# Patient Record
Sex: Male | Born: 1950
Health system: Southern US, Community
[De-identification: ages and names within clinical notes are randomized; demographics above are authoritative.]

## PROBLEM LIST (undated history)

## (undated) DIAGNOSIS — M1990 Unspecified osteoarthritis, unspecified site: Secondary | ICD-10-CM

## (undated) DIAGNOSIS — K219 Gastro-esophageal reflux disease without esophagitis: Secondary | ICD-10-CM

## (undated) DIAGNOSIS — I4891 Unspecified atrial fibrillation: Secondary | ICD-10-CM

## (undated) DIAGNOSIS — R7303 Prediabetes: Secondary | ICD-10-CM

## (undated) DIAGNOSIS — E785 Hyperlipidemia, unspecified: Secondary | ICD-10-CM

## (undated) DIAGNOSIS — Z8619 Personal history of other infectious and parasitic diseases: Secondary | ICD-10-CM

## (undated) DIAGNOSIS — F32A Depression, unspecified: Secondary | ICD-10-CM

## (undated) DIAGNOSIS — S42309A Unspecified fracture of shaft of humerus, unspecified arm, initial encounter for closed fracture: Secondary | ICD-10-CM

## (undated) DIAGNOSIS — I10 Essential (primary) hypertension: Secondary | ICD-10-CM

## (undated) DIAGNOSIS — E119 Type 2 diabetes mellitus without complications: Secondary | ICD-10-CM

## (undated) DIAGNOSIS — F418 Other specified anxiety disorders: Secondary | ICD-10-CM

## (undated) DIAGNOSIS — K760 Fatty (change of) liver, not elsewhere classified: Secondary | ICD-10-CM

## (undated) DIAGNOSIS — F329 Major depressive disorder, single episode, unspecified: Secondary | ICD-10-CM

## (undated) HISTORY — DX: Other specified anxiety disorders: F41.8

## (undated) HISTORY — DX: Hyperlipidemia, unspecified: E78.5

## (undated) HISTORY — DX: Personal history of other infectious and parasitic diseases: Z86.19

## (undated) HISTORY — PX: COLONOSCOPY: SHX174

## (undated) HISTORY — DX: Fatty (change of) liver, not elsewhere classified: K76.0

## (undated) HISTORY — DX: Gastro-esophageal reflux disease without esophagitis: K21.9

## (undated) HISTORY — DX: Essential (primary) hypertension: I10

## (undated) HISTORY — DX: Unspecified atrial fibrillation: I48.91

## (undated) HISTORY — DX: Prediabetes: R73.03

## (undated) HISTORY — DX: Type 2 diabetes mellitus without complications: E11.9

---

## 2007-12-06 HISTORY — PX: KNEE SURGERY: SHX244

## 2008-05-14 ENCOUNTER — Ambulatory Visit: Payer: Self-pay

## 2008-06-03 ENCOUNTER — Ambulatory Visit: Payer: Self-pay | Admitting: Orthopaedic Surgery

## 2008-06-13 ENCOUNTER — Encounter: Payer: Self-pay | Admitting: Orthopaedic Surgery

## 2008-07-05 ENCOUNTER — Encounter: Payer: Self-pay | Admitting: Orthopaedic Surgery

## 2009-12-28 ENCOUNTER — Ambulatory Visit: Payer: Self-pay | Admitting: Gastroenterology

## 2010-11-01 ENCOUNTER — Ambulatory Visit: Payer: Self-pay | Admitting: Endocrinology

## 2011-02-23 ENCOUNTER — Ambulatory Visit: Payer: Self-pay | Admitting: Internal Medicine

## 2011-04-15 ENCOUNTER — Ambulatory Visit: Payer: Self-pay | Admitting: Internal Medicine

## 2013-01-22 ENCOUNTER — Ambulatory Visit: Payer: Self-pay | Admitting: Nurse Practitioner

## 2013-05-03 LAB — LIPID PANEL: LDL (calc): 82

## 2013-05-03 LAB — HEMOGLOBIN A1C: A1c: 5.8

## 2013-05-14 LAB — HM DIABETES FOOT EXAM

## 2013-09-16 ENCOUNTER — Encounter (INDEPENDENT_AMBULATORY_CARE_PROVIDER_SITE_OTHER): Payer: Self-pay

## 2013-09-16 ENCOUNTER — Ambulatory Visit (INDEPENDENT_AMBULATORY_CARE_PROVIDER_SITE_OTHER): Payer: BC Managed Care – PPO | Admitting: Cardiovascular Disease

## 2013-09-16 ENCOUNTER — Encounter: Payer: Self-pay | Admitting: Cardiovascular Disease

## 2013-09-16 VITALS — BP 130/86 | HR 80 | Ht 71.0 in | Wt 204.2 lb

## 2013-09-16 DIAGNOSIS — I480 Paroxysmal atrial fibrillation: Secondary | ICD-10-CM | POA: Insufficient documentation

## 2013-09-16 DIAGNOSIS — F329 Major depressive disorder, single episode, unspecified: Secondary | ICD-10-CM

## 2013-09-16 DIAGNOSIS — F339 Major depressive disorder, recurrent, unspecified: Secondary | ICD-10-CM | POA: Insufficient documentation

## 2013-09-16 DIAGNOSIS — E119 Type 2 diabetes mellitus without complications: Secondary | ICD-10-CM

## 2013-09-16 DIAGNOSIS — F32A Depression, unspecified: Secondary | ICD-10-CM

## 2013-09-16 DIAGNOSIS — E118 Type 2 diabetes mellitus with unspecified complications: Secondary | ICD-10-CM | POA: Insufficient documentation

## 2013-09-16 DIAGNOSIS — Z87891 Personal history of nicotine dependence: Secondary | ICD-10-CM

## 2013-09-16 DIAGNOSIS — E1169 Type 2 diabetes mellitus with other specified complication: Secondary | ICD-10-CM | POA: Insufficient documentation

## 2013-09-16 DIAGNOSIS — F3289 Other specified depressive episodes: Secondary | ICD-10-CM

## 2013-09-16 DIAGNOSIS — I4891 Unspecified atrial fibrillation: Secondary | ICD-10-CM

## 2013-09-16 DIAGNOSIS — E785 Hyperlipidemia, unspecified: Secondary | ICD-10-CM

## 2013-09-16 DIAGNOSIS — I10 Essential (primary) hypertension: Secondary | ICD-10-CM

## 2013-09-16 MED ORDER — ATORVASTATIN CALCIUM 40 MG PO TABS: 40.0000 mg | ORAL_TABLET | Freq: Every day | ORAL | Status: DC

## 2013-09-16 MED ORDER — PAROXETINE HCL 20 MG PO TABS: 20.0000 mg | ORAL_TABLET | ORAL | Status: DC

## 2013-09-16 NOTE — Assessment & Plan Note (Signed)
He closely monitor his his heart rate with a blood pressure cuff, also closely tracks his rhythm. He denies having any further arrhythmia concerning for atrial fibrillation. We have suggested he stay on his metoprolol.

## 2013-09-16 NOTE — Assessment & Plan Note (Signed)
At his request, we have renewed his Celexa. When he establishes with primary care, he would prefer them to take over the prescription writing.

## 2013-09-16 NOTE — Assessment & Plan Note (Signed)
Blood pressure is well controlled on today's visit. No changes made to the medications. 

## 2013-09-16 NOTE — Assessment & Plan Note (Signed)
We have encouraged him to watch his diet, try to keep his weight low. Most recent hemoglobin A1c 5.8 in May 2014.

## 2013-09-16 NOTE — Patient Instructions (Signed)
You are doing well. No medication changes were made.  Consider downloading the apps for heart rate monitoring: Cardiograph, instant heart rate  Please call us if you have new issues that need to be addressed before your next appt.  Your physician wants you to follow-up in: 12 months.  You will receive a reminder letter in the mail two months in advance. If you don't receive a letter, please call our office to schedule the follow-up appointment.

## 2013-09-16 NOTE — Assessment & Plan Note (Signed)
He reports that he only smoked for 5 years, stopped in the 1980s.

## 2013-09-16 NOTE — Assessment & Plan Note (Signed)
Cholesterol is at goal on the current lipid regimen. No changes to the medications were made.  

## 2013-09-16 NOTE — Progress Notes (Signed)
Patient ID: Jonathon Middleton, male    DOB: Aug 27, 1951, 62 y.o.   MRN: [id number]  HPI Comments: Jonathon Middleton is a very pleasant 62 year old gentleman with prior smoking history for 5 years who stopped in 1984, prior history of atrial fibrillation in January 2014 converted to normal sinus rhythm with medical management who presents to establish care in our office.  He currently does not have a primary care physician and is moving cardiologist to our group.  He reports that he had asymptomatic atrial fibrillation in January 2014. He did have some mild malaise, bloating in his chest. He was found on routine physical exam by Dr. Alison Murray to have a heart rate of 140 beats per minute. He was referred to [facility] cardiology, started on metoprolol. He had echocardiogram at that time which was essentially normal. Also had stress test with normal treadmill  He reports having significant work stress. Tries to work on his treadmill when he can. Weight has been a challenge to maintain secondary to long work hours. Labs from may 2014 :  Hemoglobin A1c 5.8, total cholesterol 151, LDL 82, HDL 39  He has been taking a cholesterol medication for at least 14 years in terms of his family history, father had heart attack at age 33 and died. He was a long-time smoker. Mother lived to 12  EKG today shows normal sinus rhythm with rate 80 beats per minute, no significant ST or T wave changes      Outpatient Encounter Prescriptions as of 09/16/2013  Medication Sig Dispense Refill  . aspirin 325 MG tablet Take 325 mg by mouth daily.      Marland Kitchen atorvastatin (LIPITOR) 40 MG tablet Take 1 tablet (40 mg total) by mouth daily.  90 tablet  3  . Cholecalciferol (VITAMIN D-3) 1000 UNITS CAPS Take 2,000 capsules by mouth 2 (two) times daily.      . Coenzyme Q10 (CO Q-10) 100 MG CAPS Take 100 mg by mouth daily.      . lansoprazole (PREVACID) 15 MG capsule Take 15 mg by mouth 2 (two) times daily.      Marland Kitchen lisinopril  (PRINIVIL,ZESTRIL) 20 MG tablet Take 20 mg by mouth daily.      . Melatonin 5 MG TABS Take 5 mg by mouth at bedtime.      . metoprolol succinate (TOPROL-XL) 25 MG 24 hr tablet Take 25 mg by mouth daily.      Marland Kitchen omega-3 fish oil (MAXEPA) 1000 MG CAPS capsule Take 2 capsules by mouth 2 (two) times daily.      . traZODone (DESYREL) 50 MG tablet Take 50 mg by mouth at bedtime.      Marland Kitchen PARoxetine (PAXIL) 20 MG tablet Take 1 tablet (20 mg total) by mouth every morning.  30 tablet  2   No facility-administered encounter medications on file as of 09/16/2013.     Review of Systems  Constitutional: Negative.   HENT: Negative.   Eyes: Negative.   Respiratory: Negative.   Cardiovascular: Negative.   Gastrointestinal: Negative.   Endocrine: Negative.   Musculoskeletal: Negative.   Skin: Negative.   Allergic/Immunologic: Negative.   Neurological: Negative.   Hematological: Negative.   Psychiatric/Behavioral: Negative.   All other systems reviewed and are negative.    BP 130/86  Pulse 80  Ht 5\' 11"  (1.803 m)  Wt 204 lb 4 oz (92.647 kg)  BMI 28.5 kg/m2  Physical Exam  Nursing note and vitals reviewed. Constitutional: He is oriented to person, place,  and time. He appears well-developed and well-nourished.  HENT:  Head: Normocephalic.  Nose: Nose normal.  Mouth/Throat: Oropharynx is clear and moist.  Eyes: Conjunctivae are normal. Pupils are equal, round, and reactive to light.  Neck: Normal range of motion. Neck supple. No JVD present.  Cardiovascular: Normal rate, regular rhythm, S1 normal, S2 normal, normal heart sounds and intact distal pulses.  Exam reveals no gallop and no friction rub.   No murmur heard. Pulmonary/Chest: Effort normal and breath sounds normal. No respiratory distress. He has no wheezes. He has no rales. He exhibits no tenderness.  Abdominal: Soft. Bowel sounds are normal. He exhibits no distension. There is no tenderness.  Musculoskeletal: Normal range of motion.  He exhibits no edema and no tenderness.  Lymphadenopathy:    He has no cervical adenopathy.  Neurological: He is alert and oriented to person, place, and time. Coordination normal.  Skin: Skin is warm and dry. No rash noted. No erythema.  Psychiatric: He has a normal mood and affect. His behavior is normal. Judgment and thought content normal.      Assessment and Plan

## 2013-09-19 ENCOUNTER — Encounter: Payer: Self-pay | Admitting: Family Medicine

## 2013-09-19 ENCOUNTER — Ambulatory Visit (INDEPENDENT_AMBULATORY_CARE_PROVIDER_SITE_OTHER): Payer: BC Managed Care – PPO | Admitting: Family Medicine

## 2013-09-19 ENCOUNTER — Other Ambulatory Visit: Payer: Self-pay | Admitting: *Deleted

## 2013-09-19 VITALS — BP 124/82 | HR 130 | Temp 97.9°F | Ht 71.0 in | Wt 204.0 lb

## 2013-09-19 DIAGNOSIS — K219 Gastro-esophageal reflux disease without esophagitis: Secondary | ICD-10-CM

## 2013-09-19 DIAGNOSIS — E119 Type 2 diabetes mellitus without complications: Secondary | ICD-10-CM

## 2013-09-19 DIAGNOSIS — F32A Depression, unspecified: Secondary | ICD-10-CM

## 2013-09-19 DIAGNOSIS — K760 Fatty (change of) liver, not elsewhere classified: Secondary | ICD-10-CM

## 2013-09-19 DIAGNOSIS — I10 Essential (primary) hypertension: Secondary | ICD-10-CM

## 2013-09-19 DIAGNOSIS — K7689 Other specified diseases of liver: Secondary | ICD-10-CM

## 2013-09-19 DIAGNOSIS — F329 Major depressive disorder, single episode, unspecified: Secondary | ICD-10-CM

## 2013-09-19 DIAGNOSIS — I4891 Unspecified atrial fibrillation: Secondary | ICD-10-CM

## 2013-09-19 DIAGNOSIS — E785 Hyperlipidemia, unspecified: Secondary | ICD-10-CM

## 2013-09-19 DIAGNOSIS — F3289 Other specified depressive episodes: Secondary | ICD-10-CM

## 2013-09-19 MED ORDER — METOPROLOL SUCCINATE ER 50 MG PO TB24: 50.0000 mg | ORAL_TABLET | Freq: Every day | ORAL | Status: DC

## 2013-09-19 MED ORDER — APIXABAN 5 MG PO TABS: 5.0000 mg | ORAL_TABLET | Freq: Two times a day (BID) | ORAL | Status: DC

## 2013-09-19 MED ORDER — PAROXETINE HCL 40 MG PO TABS: 40.0000 mg | ORAL_TABLET | ORAL | Status: DC

## 2013-09-19 NOTE — Assessment & Plan Note (Signed)
Chronic, stable. Continue meds. 

## 2013-09-19 NOTE — Assessment & Plan Note (Signed)
fmhx premature CAD.  Goal LDL <100. Brings records showing last LDL 86. Continue lipitor. ?h/o transaminitis to statin - check LFT next blood draw.

## 2013-09-19 NOTE — Assessment & Plan Note (Signed)
Consider abd [place] if transaminitis returns. H/o transaminitis to statin in the past.  Tolerating lipitor currently.

## 2013-09-19 NOTE — Patient Instructions (Addendum)
Call your insurance about the shingles shot to see if it is covered or how much it would cost and where is cheaper (here or pharmacy).  If you want to receive here, call for nurse visit. Let's try just melatonin for sleep at night. Take paxil 20mg  for 1 week then increase to 40mg  daily (new prescription at pharmacy) Try prevacid once daily. Stop Aspirin.  Start blood thinner eliquis 5mg  twice daily  - first pill tonight.  - samples are available at cardiologist's office. Dr Mariah Milling will see you tomorrow- they will call you to schedule appointment. Increase metoprolol to 50mg  once daily (2 tablets daily) - take extra metoprolol 25mg  tonight, then start higher dose tomorrow

## 2013-09-19 NOTE — Assessment & Plan Note (Addendum)
All his life he has suffered from depression. Recently deteriorated 2/2 increased stress from lawsuit. I suggested he continue paxil 20mg  for next week then increase to 40mg  daily. Discussed option of counseling which he is open to, but we decided to give 1 month on paxil and then will reassess mood.

## 2013-09-19 NOTE — Assessment & Plan Note (Addendum)
Reviewed EKG from 09/16/2013 - normal sinus. He has therefore converted over last 3 days. Today EKG showing atrial fibrillation at rate of ~150.  I do not clearly see 2:1 P wave conduction so do not think he has flutter. Not decompensated with current tachycardia - actually currently asymptomatic. I touched base with Dr. Mariah Milling, who recommends starting eliquis (samples available at cardiology office) and he will see him tomorrow for further evaluation.  I appreciate Dr. Windell Hummingbird assistance I have also increased his metoprolol succinate to 50mg  daily. Pt agrees with plan. CHA2DS2-VASc score = 1  EKG - atrial fib vs flutter at a rate of ~150, normal axis and intervals, no acute ST/T changes appreciated.

## 2013-09-19 NOTE — Assessment & Plan Note (Signed)
I suggested he decrease prevacid to 15mg  once daily to see if this alone controls heartburn sxs.

## 2013-09-19 NOTE — Progress Notes (Signed)
Subjective:    Patient ID: Jonathon Middleton, male    DOB: 09-03-1951, 62 y.o.   MRN: [id number]  HPI CC: new pt to establish  Pleasant 62 yo presents today to establish care.  Dr. Alison Middleton, prior PCP, left to go to [place]. Prior saw Dr. Gwen Middleton, now recently established with Dr Jonathon Middleton (this week).  Atrial fibrillation - yesterday noticed tachycardia to 160 while on treadmill, asxs otherwise.   ?DM vs prediabetes - h/o impaired glucose tolerance.  Last eye exam 06/06/12.  Last foot exam 05/14/13 Checks sugars every morning fasting - range 94-120. Daily 30 min on treadmill Gaining weight recently 7 lbs over last 2 months.  Depression - paxil 30mg  started summer 2014.  Ran out cold [place], has restarted paxil 2 days ago.  Major stress - recent lawsuit agaist him - to file for bankruptcy.  Father deceased when patient was 54.  Likely clinically depressed longterm.  Trouble sleeping, appetite increased.  Some anhedonia, depressed mood.  Trouble focusing, energy level down (but improved with exercise).  No HI.  + fleeting SI, would never act on this. Has not seeked counselor recently.  Preventative: Colon cancer screening - colonoscopy 2009 WNL per patient (Dr. Bluford Kaufmann) Would like flu shot, Tdap, pneumonia shot today. Shingles shot - discussed.  Will check with insurance.  Lives with wife, son, cats and dogs Occupation: Corporate treasurer - into genetics Activity: treadmill Diet: good water, seldom fruits/vegetables, fish weekly  Medications and allergies reviewed and updated in chart.  Past histories reviewed and updated if relevant as below. Patient Active Problem List   Diagnosis Date Noted  . A-fib 09/16/2013  . Hyperlipidemia 09/16/2013  . Essential hypertension 09/16/2013  . Smoking history 09/16/2013  . Depression 09/16/2013  . Diet-controlled type 2 diabetes mellitus 09/16/2013   Past Medical History  Diagnosis Date  . A-fib   . Hypertension   . Hyperlipidemia   .  Diet-controlled type 2 diabetes mellitus     ?prediabetes - treated with diet & exercise.  Marland Kitchen GERD (gastroesophageal reflux disease)   . Depression   . Anxiety   . Nonalcoholic fatty liver disease     ?h/o elevated LFTs  . History of chicken pox    Past Surgical History  Procedure Laterality Date  . Knee surgery Right 2009    x 2 (Dr. Ernest Pine)   History  Substance Use Topics  . Smoking status: Former Smoker -- 1.00 packs/day for 5 years    Types: Cigars    Quit date: 04/04/1983  . Smokeless tobacco: Never Used  . Alcohol Use: 1.2 oz/week    2 Glasses of wine per week     Comment: regular 2 glasses wine/day   Family History  Problem Relation Age of Onset  . Hypertension Mother   . CAD Father 59    massive MI, deceased  . CAD Paternal Uncle 9    MI, smoker  . Stroke Paternal Aunt   . Diabetes Paternal Grandmother   . Cancer Maternal Uncle     lung cancer (non small cell), smoker   No Known Allergies Current Outpatient Prescriptions on File Prior to Visit  Medication Sig Dispense Refill  . aspirin 325 MG tablet Take 162 mg by mouth daily.       . Cholecalciferol (VITAMIN D-3) 1000 UNITS CAPS Take 2,000 capsules by mouth 2 (two) times daily.      . Coenzyme Q10 (CO Q-10) 100 MG CAPS Take 100 mg by mouth daily.      Marland Kitchen  lansoprazole (PREVACID) 15 MG capsule Take 15 mg by mouth daily.       Marland Kitchen lisinopril (PRINIVIL,ZESTRIL) 20 MG tablet Take 20 mg by mouth daily.      . Melatonin 5 MG TABS Take 5 mg by mouth at bedtime.      . metoprolol succinate (TOPROL-XL) 25 MG 24 hr tablet Take 25 mg by mouth daily.      Marland Kitchen omega-3 fish oil (MAXEPA) 1000 MG CAPS capsule Take 2 capsules by mouth 2 (two) times daily.      . traZODone (DESYREL) 50 MG tablet Take 25 mg by mouth at bedtime.        No current facility-administered medications on file prior to visit.     Review of Systems  Constitutional: Negative for fever, chills, activity change, appetite change, fatigue and unexpected  weight change.  HENT: Negative for hearing loss.   Eyes: Negative for visual disturbance.  Respiratory: Negative for cough, chest tightness, shortness of breath and wheezing.   Cardiovascular: Negative for chest pain, palpitations and leg swelling.  Gastrointestinal: Negative for nausea, vomiting, abdominal pain, diarrhea, constipation, blood in stool and abdominal distention.  Genitourinary: Negative for hematuria and difficulty urinating.  Musculoskeletal: Negative for arthralgias, myalgias and neck pain.  Skin: Negative for rash.  Neurological: Negative for dizziness, seizures, syncope and headaches.  Hematological: Negative for adenopathy. Does not bruise/bleed easily.  Psychiatric/Behavioral: Negative for dysphoric mood. The patient is not nervous/anxious.        Objective:   Physical Exam  Nursing note and vitals reviewed. Constitutional: He is oriented to person, place, and time. He appears well-developed and well-nourished. No distress.  HENT:  Head: Normocephalic and atraumatic.  Mouth/Throat: Oropharynx is clear and moist. No oropharyngeal exudate.  Eyes: Conjunctivae and EOM are normal. Pupils are equal, round, and reactive to light. No scleral icterus.  Neck: Normal range of motion. Neck supple. No thyromegaly present.  Cardiovascular: Normal heart sounds and intact distal pulses.  An irregularly irregular rhythm present. Tachycardia present.   No murmur heard. Pulses:      Radial pulses are 2+ on the right side, and 2+ on the left side.  Pulmonary/Chest: Effort normal and breath sounds normal. No respiratory distress. He has no wheezes. He has no rales.  Musculoskeletal: Normal range of motion. He exhibits no edema.  Lymphadenopathy:    He has no cervical adenopathy.  Neurological: He is alert and oriented to person, place, and time.  CN grossly intact, station and gait intact  Skin: Skin is warm and dry. No rash noted.  Psychiatric: He has a normal mood and affect.  His behavior is normal. Judgment and thought content normal.       Assessment & Plan:

## 2013-09-19 NOTE — Assessment & Plan Note (Signed)
Unclear diagnosis - anticipate prediabetes.  Will await next A1c to change diagnosis.

## 2013-09-20 ENCOUNTER — Telehealth: Payer: Self-pay

## 2013-09-20 NOTE — Telephone Encounter (Signed)
Spoke w/ pt.  He states that he feels good this morning.  BP 98/56, HR 88. States that he heartrate is still irregular.  He took 2 metoprolol last night and wants to know if he should take an extra this morning.  He is concerned about his BP, as "he is on the carpet as it is now". He will only take 1 this am and wait to hear back about taking the second. Please advise.  Thank you.

## 2013-09-22 NOTE — Telephone Encounter (Signed)
Would call to see if he is still in atrial fibrillation. Options include holding the lisinopril and starting diltriazem 120 mg daily, Stay on metoprolol   Or could take diltiazem 30 mg tabs prn for breakthrough atrial fib (stay on lisinopril)

## 2013-09-23 ENCOUNTER — Ambulatory Visit (INDEPENDENT_AMBULATORY_CARE_PROVIDER_SITE_OTHER): Payer: BC Managed Care – PPO | Admitting: Cardiovascular Disease

## 2013-09-23 ENCOUNTER — Encounter: Payer: Self-pay | Admitting: Cardiovascular Disease

## 2013-09-23 VITALS — BP 130/82 | HR 68 | Ht 71.0 in | Wt 206.0 lb

## 2013-09-23 DIAGNOSIS — I10 Essential (primary) hypertension: Secondary | ICD-10-CM

## 2013-09-23 DIAGNOSIS — I4891 Unspecified atrial fibrillation: Secondary | ICD-10-CM

## 2013-09-23 MED ORDER — DILTIAZEM HCL 30 MG PO TABS: 30.0000 mg | ORAL_TABLET | Freq: Four times a day (QID) | ORAL | Status: DC | PRN

## 2013-09-23 NOTE — Assessment & Plan Note (Signed)
If he does start taking additional metoprolol and diltiazem, we have suggested he cut his lisinopril in half or hold the medication for low blood pressures.

## 2013-09-23 NOTE — Progress Notes (Signed)
Patient ID: Jonathon Middleton, male    DOB: Dec 18, 1950, 62 y.o.   MRN: [id number]  HPI Comments: Mr Sherk is a very pleasant 62 year old gentleman with prior smoking history for 5 years who stopped in 1984, prior history of atrial fibrillation in January 2014 converted to normal sinus rhythm with medical management who presents after developing atrial fibrillation.  He is seeing his primary care physician, Dr. Sharen Hones, on 09/19/2013 when he was noted to be in atrial fibrillation with rate in the 140 range. He is relatively asymptomatic. It was suggested that he take extra metoprolol and start Eliquis 5 mg twice a day. He presents in followup today. He reports that his rate improved back down to normal on October 18. Again he is relatively asymptomatic, no significant shortness of breath while in atrial fibrillation.   he had asymptomatic atrial fibrillation in January 2014. He did have some mild malaise, bloating in his chest. He was found on routine physical exam by Dr. Alison Murray to have a heart rate of 140 beats per minute. He was referred to [facility] cardiology, started on metoprolol. He had echocardiogram at that time which was essentially normal. Also had stress test with normal treadmill  Labs from may 2014 :  Hemoglobin A1c 5.8, total cholesterol 151, LDL 82, HDL 39 He has been taking a cholesterol medication for at least 14 years in terms of his family history, father had heart attack at age 26 and died. He was a long-time smoker. Mother lived to 59  EKG today shows normal sinus rhythm with rate 68 beats per minute, no significant ST or T wave changes     Outpatient Encounter Prescriptions as of 09/23/2013  Medication Sig Dispense Refill  . apixaban (ELIQUIS) 5 MG TABS tablet Take 1 tablet (5 mg total) by mouth 2 (two) times daily.  60 tablet  1  . atorvastatin (LIPITOR) 40 MG tablet Take 40 mg by mouth at bedtime.      . Cholecalciferol (VITAMIN D-3) 1000 UNITS CAPS Take 2,000  capsules by mouth 2 (two) times daily.      . Coenzyme Q10 (CO Q-10) 100 MG CAPS Take 100 mg by mouth daily.      . lansoprazole (PREVACID) 15 MG capsule Take 15 mg by mouth daily.       Marland Kitchen lisinopril (PRINIVIL,ZESTRIL) 20 MG tablet Take 20 mg by mouth daily.      . Melatonin 5 MG TABS Take 5 mg by mouth at bedtime.      . metoprolol succinate (TOPROL-XL) 50 MG 24 hr tablet Take 1 tablet (50 mg total) by mouth daily.  30 tablet  6  . Multiple Vitamin (MULTIVITAMIN) tablet Take 1 tablet by mouth daily.      Marland Kitchen omega-3 fish oil (MAXEPA) 1000 MG CAPS capsule Take 2 capsules by mouth 2 (two) times daily.      Marland Kitchen PARoxetine (PAXIL) 40 MG tablet Take 1 tablet (40 mg total) by mouth every morning.  30 tablet  3  . traZODone (DESYREL) 50 MG tablet Take 25 mg by mouth at bedtime.        No facility-administered encounter medications on file as of 09/23/2013.      Review of Systems  Constitutional: Negative.   HENT: Negative.   Eyes: Negative.   Respiratory: Negative.   Cardiovascular: Negative.   Gastrointestinal: Negative.   Endocrine: Negative.   Musculoskeletal: Negative.   Skin: Negative.   Allergic/Immunologic: Negative.   Neurological: Negative.   Hematological: Negative.  Psychiatric/Behavioral: Negative.   All other systems reviewed and are negative.    BP 130/82  Pulse 68  Ht 5\' 11"  (1.803 m)  Wt 206 lb (93.441 kg)  BMI 28.74 kg/m2  Physical Exam  Nursing note and vitals reviewed. Constitutional: He is oriented to person, place, and time. He appears well-developed and well-nourished.  HENT:  Head: Normocephalic.  Nose: Nose normal.  Mouth/Throat: Oropharynx is clear and moist.  Eyes: Conjunctivae are normal. Pupils are equal, round, and reactive to light.  Neck: Normal range of motion. Neck supple. No JVD present.  Cardiovascular: Normal rate, regular rhythm, S1 normal, S2 normal, normal heart sounds and intact distal pulses.  Exam reveals no gallop and no friction rub.    No murmur heard. Pulmonary/Chest: Effort normal and breath sounds normal. No respiratory distress. He has no wheezes. He has no rales. He exhibits no tenderness.  Abdominal: Soft. Bowel sounds are normal. He exhibits no distension. There is no tenderness.  Musculoskeletal: Normal range of motion. He exhibits no edema and no tenderness.  Lymphadenopathy:    He has no cervical adenopathy.  Neurological: He is alert and oriented to person, place, and time. Coordination normal.  Skin: Skin is warm and dry. No rash noted. No erythema.  Psychiatric: He has a normal mood and affect. His behavior is normal. Judgment and thought content normal.      Assessment and Plan

## 2013-09-23 NOTE — Telephone Encounter (Signed)
Pt here to see Dr. Mariah Milling 09/23/13.

## 2013-09-23 NOTE — Assessment & Plan Note (Signed)
He is back in normal sinus rhythm. We have encouraged him to stay on the Toprol succinate 50 mg in the morning, 25 mg in the evening. He would take diltiazem 30 mg as needed for breakthrough arrhythmia. As he is in normal sinus rhythm and closely monitoring his rhythm daily, he came to her back on aspirin, changing back to anticoagulation/eliquis, for further arrhythmia. If he continues to have episodes, we could give him for path none on flecainide to be taking either as needed or on a regular basis depending on his frequency of atrial fibrillation.

## 2013-09-23 NOTE — Patient Instructions (Addendum)
You are doing well. You are back in normal rhythm  Please take diltiazem as needed for atrial fibrillation Try metoprolol 50 mg in the Am, 25 mg in the PM Please  Monitor heart rate and blood pressure  Ok to hold lisinopril if needed  Ok to stop eliquis, restart aspirin  Please call us if you have new issues that need to be addressed before your next appt.  Your physician wants you to follow-up in: 6 months.  You will receive a reminder letter in the mail two months in advance. If you don't receive a letter, please call our office to schedule the follow-up appointment.

## 2013-09-25 ENCOUNTER — Ambulatory Visit (INDEPENDENT_AMBULATORY_CARE_PROVIDER_SITE_OTHER): Payer: BC Managed Care – PPO

## 2013-09-25 DIAGNOSIS — Z23 Encounter for immunization: Secondary | ICD-10-CM

## 2013-09-26 ENCOUNTER — Encounter: Payer: Self-pay | Admitting: *Deleted

## 2013-10-01 ENCOUNTER — Encounter: Payer: Self-pay | Admitting: *Deleted

## 2013-10-21 ENCOUNTER — Ambulatory Visit (INDEPENDENT_AMBULATORY_CARE_PROVIDER_SITE_OTHER): Payer: BC Managed Care – PPO | Admitting: Family Medicine

## 2013-10-21 ENCOUNTER — Ambulatory Visit: Payer: BC Managed Care – PPO | Admitting: Internal Medicine

## 2013-10-21 ENCOUNTER — Encounter: Payer: Self-pay | Admitting: Family Medicine

## 2013-10-21 VITALS — BP 132/84 | HR 72 | Temp 98.0°F | Wt 207.5 lb

## 2013-10-21 DIAGNOSIS — Z2911 Encounter for prophylactic immunotherapy for respiratory syncytial virus (RSV): Secondary | ICD-10-CM

## 2013-10-21 DIAGNOSIS — I4891 Unspecified atrial fibrillation: Secondary | ICD-10-CM

## 2013-10-21 DIAGNOSIS — I10 Essential (primary) hypertension: Secondary | ICD-10-CM

## 2013-10-21 DIAGNOSIS — Z23 Encounter for immunization: Secondary | ICD-10-CM

## 2013-10-21 DIAGNOSIS — R7303 Prediabetes: Secondary | ICD-10-CM

## 2013-10-21 DIAGNOSIS — F339 Major depressive disorder, recurrent, unspecified: Secondary | ICD-10-CM

## 2013-10-21 DIAGNOSIS — I480 Paroxysmal atrial fibrillation: Secondary | ICD-10-CM

## 2013-10-21 DIAGNOSIS — R7309 Other abnormal glucose: Secondary | ICD-10-CM

## 2013-10-21 NOTE — Assessment & Plan Note (Signed)
Stable only on toprol XL 50mg  daily - and has discontinued lisinopril.  Discussed restarting low dose ACEI in future, but will hold for now.

## 2013-10-21 NOTE — Assessment & Plan Note (Signed)
Reviewed dx with patient- will continue to monitor yearly.

## 2013-10-21 NOTE — Progress Notes (Signed)
Pre-visit discussion using our clinic review tool. No additional management support is needed unless otherwise documented below in the visit note.  

## 2013-10-21 NOTE — Assessment & Plan Note (Signed)
Continue toprol xl 50mg  daily - with dilt for breakthrough arrhythmia. Continue aspirin daily.

## 2013-10-21 NOTE — Assessment & Plan Note (Signed)
Much better controlled on increased paxil dose - will continue 40mg  daily. May address counseling in future.

## 2013-10-21 NOTE — Progress Notes (Signed)
  Subjective:    Patient ID: Jonathon Middleton, male    DOB: [birth year], 62 y.o.   MRN: [id number]  HPI CC: 1 mo f/u  afib - stopped caffeine.  Back in normal sinus rhythm.  Saw Dr. Mariah Milling - plan is to continue toprol xl 50mg  daily and use diltiazem 30mg  as needed for breakthrough arrhythmia.  Also back on aspirin, will use eliquis if afib returns.  Mood - paxil 40mg  daily - feels mood stable now.  Improvement on higher dose.  HTN - off lisinopril 20mg  - stable only on toprol xl 50mg  daily.  Prediabetes - discussed diagnosis No results found for this basename: HGBA1C   HLD - tolerating lipitor 40mg  daily along with fish oil and coq 10.  Pneumovax and zostavax today.  Past Medical History  Diagnosis Date  . Atrial fibrillation   . Hypertension   . Hyperlipidemia   . Diet-controlled type 2 diabetes mellitus     ?prediabetes - treated with diet & exercise.  Marland Kitchen GERD (gastroesophageal reflux disease)   . Depression with anxiety   . Nonalcoholic fatty liver disease     ?h/o elevated LFTs, has not had [place]  . History of chicken pox     Review of Systems Per HPI    Objective:   Physical Exam  Nursing note and vitals reviewed. Constitutional: He appears well-developed and well-nourished. No distress.  HENT:  Mouth/Throat: Oropharynx is clear and moist. No oropharyngeal exudate.  Cardiovascular: Normal rate, regular rhythm, normal heart sounds and intact distal pulses.   No murmur heard. Pulmonary/Chest: Effort normal and breath sounds normal. No respiratory distress. He has no wheezes. He has no rales.  Musculoskeletal: He exhibits no edema.       Assessment & Plan:

## 2013-10-21 NOTE — Patient Instructions (Addendum)
Pneumovax and zostavax today. You are doing well! Return in 6 months for physical, prior fasting for blood work. Good to see you today, call us with questions.

## 2013-10-23 ENCOUNTER — Other Ambulatory Visit: Payer: Self-pay | Admitting: Cardiovascular Disease

## 2013-10-24 ENCOUNTER — Other Ambulatory Visit: Payer: Self-pay

## 2013-10-24 MED ORDER — ATORVASTATIN CALCIUM 40 MG PO TABS: 40.0000 mg | ORAL_TABLET | Freq: Every day | ORAL | Status: DC

## 2013-10-24 NOTE — Telephone Encounter (Signed)
Refill sent for Lipitor to [facility] for 90 day supply.

## 2014-02-11 ENCOUNTER — Other Ambulatory Visit: Payer: Self-pay | Admitting: Family Medicine

## 2014-02-11 NOTE — Telephone Encounter (Signed)
Ok to refill? I don't have this on his med list.

## 2014-02-12 NOTE — Telephone Encounter (Signed)
Ok to refill.  This is ok to be automatic refill.  Pt may have to pay out of pocket as he only has prediabetes.

## 2014-03-24 ENCOUNTER — Other Ambulatory Visit: Payer: Self-pay

## 2014-03-24 MED ORDER — PAROXETINE HCL 40 MG PO TABS: 40.0000 mg | ORAL_TABLET | ORAL | Status: DC

## 2014-03-24 NOTE — Telephone Encounter (Signed)
Pt has CPX for 04/17/14 with Dr Sharen HonesGutierrez; pt request refill paxil to [facility] [address]. Advised pt done.

## 2014-04-10 ENCOUNTER — Other Ambulatory Visit (INDEPENDENT_AMBULATORY_CARE_PROVIDER_SITE_OTHER): Payer: BC Managed Care – PPO

## 2014-04-10 ENCOUNTER — Other Ambulatory Visit: Payer: Self-pay | Admitting: Family Medicine

## 2014-04-10 DIAGNOSIS — Z125 Encounter for screening for malignant neoplasm of prostate: Secondary | ICD-10-CM

## 2014-04-10 DIAGNOSIS — R5383 Other fatigue: Secondary | ICD-10-CM

## 2014-04-10 DIAGNOSIS — K760 Fatty (change of) liver, not elsewhere classified: Secondary | ICD-10-CM

## 2014-04-10 DIAGNOSIS — R5381 Other malaise: Secondary | ICD-10-CM

## 2014-04-10 DIAGNOSIS — I10 Essential (primary) hypertension: Secondary | ICD-10-CM

## 2014-04-10 DIAGNOSIS — K7689 Other specified diseases of liver: Secondary | ICD-10-CM

## 2014-04-10 DIAGNOSIS — R7309 Other abnormal glucose: Secondary | ICD-10-CM

## 2014-04-10 DIAGNOSIS — E785 Hyperlipidemia, unspecified: Secondary | ICD-10-CM

## 2014-04-10 DIAGNOSIS — R7303 Prediabetes: Secondary | ICD-10-CM

## 2014-04-10 LAB — COMPREHENSIVE METABOLIC PANEL
ALT: 51 U/L (ref 0–53)
AST: 35 U/L (ref 0–37)
Albumin: 4.1 g/dL (ref 3.5–5.2)
Alkaline Phosphatase: 115 U/L (ref 39–117)
BUN: 26 mg/dL — AB (ref 6–23)
CHLORIDE: 104 meq/L (ref 96–112)
CO2: 29 mEq/L (ref 19–32)
CREATININE: 1.1 mg/dL (ref 0.4–1.5)
Calcium: 9.3 mg/dL (ref 8.4–10.5)
GFR: 72.97 mL/min (ref >60.00)
Glucose, Bld: 134 mg/dL — ABNORMAL HIGH (ref 70–99)
Potassium: 4.8 mEq/L (ref 3.5–5.1)
Sodium: 140 mEq/L (ref 135–145)
Total Bilirubin: 1.1 mg/dL (ref 0.2–1.2)
Total Protein: 6.9 g/dL (ref 6.0–8.3)

## 2014-04-10 LAB — LIPID PANEL
CHOLESTEROL: 241 mg/dL — AB (ref 0–200)
HDL: 41.7 mg/dL (ref >39.00)
LDL CALC: 113 mg/dL — AB (ref 0–99)
TRIGLYCERIDES: 432 mg/dL — AB (ref 0.0–149.0)
Total CHOL/HDL Ratio: 6
VLDL: 86.4 mg/dL — AB (ref 0.0–40.0)

## 2014-04-10 LAB — TSH: TSH: 1.64 u[IU]/mL (ref 0.35–4.50)

## 2014-04-10 LAB — PSA: PSA: 0.82 ng/mL (ref 0.10–4.00)

## 2014-04-10 LAB — HEMOGLOBIN A1C: HEMOGLOBIN A1C: 6.2 % (ref 4.6–6.5)

## 2014-04-11 ENCOUNTER — Telehealth: Payer: Self-pay | Admitting: Family Medicine

## 2014-04-11 LAB — VITAMIN D 25 HYDROXY (VIT D DEFICIENCY, FRACTURES): VIT D 25 HYDROXY: 52 ng/mL (ref 30–89)

## 2014-04-11 NOTE — Telephone Encounter (Signed)
Relevant patient education assigned to patient using Emmi. ° °

## 2014-04-17 ENCOUNTER — Ambulatory Visit (INDEPENDENT_AMBULATORY_CARE_PROVIDER_SITE_OTHER): Payer: BC Managed Care – PPO | Admitting: Family Medicine

## 2014-04-17 ENCOUNTER — Encounter: Payer: Self-pay | Admitting: Family Medicine

## 2014-04-17 VITALS — BP 140/86 | HR 76 | Temp 97.9°F | Ht 71.0 in | Wt 220.5 lb

## 2014-04-17 DIAGNOSIS — I10 Essential (primary) hypertension: Secondary | ICD-10-CM

## 2014-04-17 DIAGNOSIS — R7303 Prediabetes: Secondary | ICD-10-CM

## 2014-04-17 DIAGNOSIS — E66811 Obesity, class 1: Secondary | ICD-10-CM | POA: Insufficient documentation

## 2014-04-17 DIAGNOSIS — I4891 Unspecified atrial fibrillation: Secondary | ICD-10-CM

## 2014-04-17 DIAGNOSIS — F339 Major depressive disorder, recurrent, unspecified: Secondary | ICD-10-CM

## 2014-04-17 DIAGNOSIS — I480 Paroxysmal atrial fibrillation: Secondary | ICD-10-CM

## 2014-04-17 DIAGNOSIS — Z0000 Encounter for general adult medical examination without abnormal findings: Secondary | ICD-10-CM

## 2014-04-17 DIAGNOSIS — E669 Obesity, unspecified: Secondary | ICD-10-CM

## 2014-04-17 DIAGNOSIS — E785 Hyperlipidemia, unspecified: Secondary | ICD-10-CM

## 2014-04-17 DIAGNOSIS — R7309 Other abnormal glucose: Secondary | ICD-10-CM

## 2014-04-17 DIAGNOSIS — Z23 Encounter for immunization: Secondary | ICD-10-CM

## 2014-04-17 MED ORDER — ATORVASTATIN CALCIUM 40 MG PO TABS: 40.0000 mg | ORAL_TABLET | Freq: Every day | ORAL | Status: DC

## 2014-04-17 MED ORDER — SERTRALINE HCL 100 MG PO TABS: 100.0000 mg | ORAL_TABLET | Freq: Every day | ORAL | Status: DC

## 2014-04-17 MED ORDER — METOPROLOL SUCCINATE ER 50 MG PO TB24: 50.0000 mg | ORAL_TABLET | Freq: Every day | ORAL | Status: DC

## 2014-04-17 NOTE — Progress Notes (Signed)
BP 140/86  Pulse 76  Temp(Src) 97.9 F (36.6 C) (Oral)  Ht 5\' 11"  (1.803 m)  Wt 220 lb 8 oz (100.018 kg)  BMI 30.77 kg/m2   CC: CPE  Subjective:    Patient ID: Jonathon Middleton, male    DOB: [birth year], 63 y.o.   MRN: [id number]  HPI: Jonathon Middleton is a 63 y.o. male presenting on 04/17/2014 for Annual Exam   1 episode of Afib over last few months controlled with diltiazem.  Wt Readings from Last 3 Encounters:  04/17/14 220 lb 8 oz (100.018 kg)  10/21/13 207 lb 8 oz (94.121 kg)  09/23/13 206 lb (93.441 kg)   Body mass index is 30.77 kg/(m^2). Changing diet - decreased calories, avoids carbs.  Still finds significant weight gain. This correlates when paxil was increased.  Also notes significant sedentary lifestyle currently.  Depression - empty nest syndrome. Staying stressed 2/2 unemployed status. Feels overall stable. Worried about weight gain with paxil.  Has been non prozac, venlafaxine, pristiq.  Preventative: Last CPE about 1 year ago by Dr. Alison MurrayAndreassi Colonoscopy - 2008 ([place]) WNL, rpt 10 yrs. Prostate cancer screening - desires to continue screening regularly. 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  Allergies and medications reviewed and updated. Current Outpatient Prescriptions on File Prior to Visit  Medication Sig  . Cholecalciferol (VITAMIN D-3) 1000 UNITS CAPS Take 2,000 capsules by mouth 2 (two) times daily.  . Coenzyme Q10 (CO Q-10) 100 MG CAPS Take 100 mg by mouth daily.  Marland Kitchen. glucose blood (ONE TOUCH ULTRA TEST) test strip Check sugars once daily or as needed  . lansoprazole (PREVACID) 15 MG capsule  Take 15 mg by mouth daily.   . Melatonin 5 MG TABS Take 5 mg by mouth at bedtime.  . Multiple Vitamin (MULTIVITAMIN) tablet Take 1 tablet by mouth daily.  Marland Kitchen. omega-3 fish oil (MAXEPA) 1000 MG CAPS capsule Take 2 capsules by mouth 2 (two) times daily.  Marland Kitchen. diltiazem (CARDIZEM) 30 MG tablet Take 1 tablet (30 mg total) by mouth 4 (four) times daily as needed.   No current facility-administered medications on file prior to visit.    Review of Systems  Constitutional: Positive for unexpected weight change. Negative for fever, chills, activity change, appetite change and fatigue.  HENT: Negative for hearing loss.   Eyes: Negative for visual disturbance.  Respiratory: Negative for cough, chest tightness, shortness of breath and wheezing.   Cardiovascular: Negative for chest pain, palpitations and leg swelling.  Gastrointestinal: Negative for nausea, vomiting, abdominal pain, diarrhea, constipation, blood in stool and abdominal distention.  Genitourinary: Negative for hematuria and difficulty urinating.  Musculoskeletal: Negative for arthralgias, myalgias and neck pain.  Skin: Negative for rash.  Neurological: Negative for dizziness, seizures, syncope and headaches.  Hematological: Negative for adenopathy. Does not bruise/bleed easily.  Psychiatric/Behavioral: Positive for dysphoric mood (see HPI). The patient is not nervous/anxious.    Per HPI unless specifically indicated above    Objective:    BP 140/86  Pulse 76  Temp(Src) 97.9 F (36.6 C) (Oral)  Ht 5\' 11"  (1.803 m)  Wt 220 lb 8 oz (100.018 kg)  BMI 30.77 kg/m2  Physical Exam  Nursing note and vitals  reviewed. Constitutional: He is oriented to person, place, and time. He appears well-developed and well-nourished. No distress.  HENT:  Head: Normocephalic and atraumatic.  Right Ear: Hearing, tympanic membrane, external ear and ear canal normal.  Left Ear: Hearing, tympanic membrane, external ear and ear canal normal.  Nose: Nose  normal.  Mouth/Throat: Uvula is midline, oropharynx is clear and moist and mucous membranes are normal. No oropharyngeal exudate, posterior oropharyngeal edema or posterior oropharyngeal erythema.  Eyes: Conjunctivae and EOM are normal. Pupils are equal, round, and reactive to light. No scleral icterus.  Neck: Normal range of motion. Neck supple. No thyromegaly present.  Cardiovascular: Normal rate, regular rhythm, normal heart sounds and intact distal pulses.   No murmur heard. Pulses:      Radial pulses are 2+ on the right side, and 2+ on the left side.  Pulmonary/Chest: Effort normal and breath sounds normal. No respiratory distress. He has no wheezes. He has no rales.  Abdominal: Soft. Bowel sounds are normal. He exhibits no distension and no mass. There is no tenderness. There is no rebound and no guarding.  Genitourinary: Prostate normal. Rectal exam shows no external hemorrhoid, no internal hemorrhoid, no fissure, no mass, no tenderness and anal tone normal. Prostate is not enlarged (15gm) and not tender.  Musculoskeletal: Normal range of motion. He exhibits no edema.  Lymphadenopathy:    He has no cervical adenopathy.  Neurological: He is alert and oriented to person, place, and time.  CN grossly intact, station and gait intact  Skin: Skin is warm and dry. No rash noted.  Psychiatric: He has a normal mood and affect. His behavior is normal. Judgment and thought content normal.   Results for orders placed in visit on 04/10/14  VITAMIN D 25 HYDROXY      Result Value Ref Range   Vit D, 25-Hydroxy 52  30 - 89 ng/mL  LIPID PANEL      Result Value Ref Range   Cholesterol 241 (*) 0 - 200 mg/dL   Triglycerides 161.0 (*) 0.0 - 149.0 mg/dL   HDL 96.04  >54.09 mg/dL   VLDL 81.1 (*) 0.0 - 91.4 mg/dL   LDL Cholesterol 782 (*) 0 - 99 mg/dL   Total CHOL/HDL Ratio 6    COMPREHENSIVE METABOLIC PANEL      Result Value Ref Range   Sodium 140  135 - 145 mEq/L   Potassium 4.8  3.5 - 5.1 mEq/L    Chloride 104  96 - 112 mEq/L   CO2 29  19 - 32 mEq/L   Glucose, Bld 134 (*) 70 - 99 mg/dL   BUN 26 (*) 6 - 23 mg/dL   Creatinine, Ser 1.1  0.4 - 1.5 mg/dL   Total Bilirubin 1.1  0.2 - 1.2 mg/dL   Alkaline Phosphatase 115  39 - 117 U/L   AST 35  0 - 37 U/L   ALT 51  0 - 53 U/L   Total Protein 6.9  6.0 - 8.3 g/dL   Albumin 4.1  3.5 - 5.2 g/dL   Calcium 9.3  8.4 - 95.6 mg/dL   GFR 21.30  >86.57 mL/min  TSH      Result Value Ref Range   TSH 1.64  0.35 - 4.50 uIU/mL  PSA      Result Value Ref Range   PSA 0.82  0.10 - 4.00 ng/mL  HEMOGLOBIN A1C      Result Value Ref Range   Hemoglobin A1C 6.2  4.6 - 6.5 %  Assessment & Plan:   Problem List Items Addressed This Visit   Paroxysmal atrial fibrillation     Stable.    Relevant Medications      metoprolol succinate (TOPROL-XL) 24 hr tablet      atorvastatin (LIPITOR) tablet      aspirin 162 MG EC tablet   Hyperlipidemia     Chol levels elevated today! Will return fasting for recheck next week.    Relevant Medications      metoprolol succinate (TOPROL-XL) 24 hr tablet      atorvastatin (LIPITOR) tablet      aspirin 162 MG EC tablet   Other Relevant Orders      Lipid panel      Amb ref to Medical Nutrition Therapy-MNT   Essential hypertension     Chronic, stable. Continue toprol xl 50mg  daily.    Relevant Medications      metoprolol succinate (TOPROL-XL) 24 hr tablet      atorvastatin (LIPITOR) tablet      aspirin 162 MG EC tablet   MDD (major depressive disorder), recurrent episode     Weight gain noticed in last 5 months. Will change paxil to sertraline 100mg  (equivalent to 40mg  paxil). Pt agrees with plan.  Will call me with update in 1 month.    Relevant Medications      sertraline (ZOLOFT) tablet   Prediabetes     Noted trend up in sugars - will monitor more closely.  A1c ok today.  Will refer to dietician.    Relevant Orders      Glucose, Random      Amb ref to Medical Nutrition Therapy-[facility]   Health  maintenance examination - Primary     Preventative protocols reviewed and updated unless pt declined. Discussed healthy diet and lifestyle. Tdap today.    Obesity     Body mass index is 30.77 kg/(m^2). weight gain noted - along with deterioration in sugar and lipid control.  Discussed importance of incorporating regular activity into routine. Will also refer to dietician per pt request.    Relevant Orders      Amb ref to Medical Nutrition Therapy-[facility]       Follow up plan: Return in about 6 months (around 10/18/2014), or as eeded, for follow up visit.

## 2014-04-17 NOTE — Assessment & Plan Note (Signed)
Body mass index is 30.77 kg/(m^2). weight gain noted - along with deterioration in sugar and lipid control.  Discussed importance of incorporating regular activity into routine. Will also refer to dietician per pt request.

## 2014-04-17 NOTE — Patient Instructions (Addendum)
Tdap today Your cholesterol was very high! Return next week fasting for blood work to recheck cholesterol levels. Let's stop paxil and start sertraline 100mg  daily. Good to see you today, call us with questions. Return as needed or in 6 months for follow up. i've refilled your medications.

## 2014-04-17 NOTE — Assessment & Plan Note (Signed)
Stable

## 2014-04-17 NOTE — Addendum Note (Signed)
Addended by: Josph MachoANCE, KIMBERLY A on: 04/17/2014 04:52 PM   Modules accepted: Orders

## 2014-04-17 NOTE — Assessment & Plan Note (Signed)
Noted trend up in sugars - will monitor more closely.  A1c ok today.  Will refer to dietician.

## 2014-04-17 NOTE — Assessment & Plan Note (Signed)
Weight gain noticed in last 5 months. Will change paxil to sertraline 100mg  (equivalent to 40mg  paxil). Pt agrees with plan.  Will call me with update in 1 month.

## 2014-04-17 NOTE — Progress Notes (Signed)
Pre visit review using our clinic review tool, if applicable. No additional management support is needed unless otherwise documented below in the visit note. 

## 2014-04-17 NOTE — Assessment & Plan Note (Signed)
Preventative protocols reviewed and updated unless pt declined. Discussed healthy diet and lifestyle.  Tdap today. 

## 2014-04-17 NOTE — Assessment & Plan Note (Signed)
Chronic, stable. Continue toprol xl 50mg  daily.

## 2014-04-17 NOTE — Assessment & Plan Note (Signed)
Chol levels elevated today! Will return fasting for recheck next week.

## 2014-04-23 ENCOUNTER — Other Ambulatory Visit (INDEPENDENT_AMBULATORY_CARE_PROVIDER_SITE_OTHER): Payer: BC Managed Care – PPO

## 2014-04-23 DIAGNOSIS — I10 Essential (primary) hypertension: Secondary | ICD-10-CM

## 2014-04-23 DIAGNOSIS — R7303 Prediabetes: Secondary | ICD-10-CM

## 2014-04-23 DIAGNOSIS — R7309 Other abnormal glucose: Secondary | ICD-10-CM

## 2014-04-23 DIAGNOSIS — E785 Hyperlipidemia, unspecified: Secondary | ICD-10-CM

## 2014-04-23 LAB — LIPID PANEL
CHOL/HDL RATIO: 5
Cholesterol: 190 mg/dL (ref 0–200)
HDL: 38.9 mg/dL — ABNORMAL LOW (ref >39.00)
LDL CALC: 85 mg/dL (ref 0–99)
Triglycerides: 331 mg/dL — ABNORMAL HIGH (ref 0.0–149.0)
VLDL: 66.2 mg/dL — ABNORMAL HIGH (ref 0.0–40.0)

## 2014-04-23 LAB — GLUCOSE, RANDOM: GLUCOSE: 132 mg/dL — AB (ref 70–99)

## 2014-04-25 ENCOUNTER — Other Ambulatory Visit: Payer: Self-pay | Admitting: *Deleted

## 2014-05-02 ENCOUNTER — Other Ambulatory Visit: Payer: Self-pay | Admitting: *Deleted

## 2014-05-02 ENCOUNTER — Ambulatory Visit: Payer: Self-pay | Admitting: Family Medicine

## 2014-05-02 MED ORDER — GLUCOSE BLOOD VI STRP: ORAL_STRIP | Status: DC

## 2014-05-02 MED ORDER — ONETOUCH ULTRASOFT LANCETS MISC: Status: DC

## 2014-05-05 ENCOUNTER — Ambulatory Visit: Payer: Self-pay | Admitting: Family Medicine

## 2014-05-08 ENCOUNTER — Encounter: Payer: Self-pay | Admitting: Family Medicine

## 2014-07-02 ENCOUNTER — Ambulatory Visit: Payer: Self-pay | Admitting: Family Medicine

## 2014-09-16 ENCOUNTER — Ambulatory Visit: Payer: BC Managed Care – PPO | Admitting: Cardiovascular Disease

## 2014-10-06 ENCOUNTER — Other Ambulatory Visit: Payer: Self-pay | Admitting: Family Medicine

## 2014-10-20 ENCOUNTER — Ambulatory Visit: Payer: BC Managed Care – PPO | Admitting: Family Medicine

## 2014-10-24 ENCOUNTER — Telehealth: Payer: Self-pay | Admitting: Family Medicine

## 2014-10-24 DIAGNOSIS — R7303 Prediabetes: Secondary | ICD-10-CM

## 2014-10-24 DIAGNOSIS — E785 Hyperlipidemia, unspecified: Secondary | ICD-10-CM

## 2014-10-24 NOTE — Telephone Encounter (Signed)
Patient did not come for their scheduled follow up appointment 10/20/14 @ 8:00am.  Please let me know if the patient needs to be contacted immediately for follow up or if no follow up is necessary.

## 2014-10-25 NOTE — Telephone Encounter (Addendum)
Would ask patient to come in for lab visit to recheck cholesterol and sugars and then office visit after. Labs ordered

## 2014-10-25 NOTE — Addendum Note (Signed)
Addended by: Eustaquio BoydenGUTIERREZ, Jamala Kohen on: 10/25/2014 12:22 PM   Modules accepted: Orders

## 2015-04-03 ENCOUNTER — Ambulatory Visit (INDEPENDENT_AMBULATORY_CARE_PROVIDER_SITE_OTHER): Payer: BLUE CROSS/BLUE SHIELD | Admitting: Cardiovascular Disease

## 2015-04-03 ENCOUNTER — Encounter: Payer: Self-pay | Admitting: Cardiovascular Disease

## 2015-04-03 VITALS — BP 148/82 | HR 72 | Ht 71.0 in | Wt 217.8 lb

## 2015-04-03 DIAGNOSIS — E785 Hyperlipidemia, unspecified: Secondary | ICD-10-CM | POA: Diagnosis not present

## 2015-04-03 DIAGNOSIS — R7309 Other abnormal glucose: Secondary | ICD-10-CM

## 2015-04-03 DIAGNOSIS — I480 Paroxysmal atrial fibrillation: Secondary | ICD-10-CM

## 2015-04-03 DIAGNOSIS — E669 Obesity, unspecified: Secondary | ICD-10-CM

## 2015-04-03 DIAGNOSIS — I10 Essential (primary) hypertension: Secondary | ICD-10-CM | POA: Diagnosis not present

## 2015-04-03 DIAGNOSIS — R7303 Prediabetes: Secondary | ICD-10-CM

## 2015-04-03 NOTE — Progress Notes (Signed)
Patient ID: Jonathon Middleton, male    DOB: 08/01/1951, 64 y.o.   MRN: [id number]  HPI Comments: [name] is a very pleasant 64 year old gentleman with prior smoking history for 5 years who stopped in 1984, prior history of atrial fibrillation in January 2014 converted to normal sinus rhythm with medical management who presents for follow-up of his atrial fibrillation.  In follow-up, he reports that he is doing well He does report having rare episodes of shortness of breath consistent with atrial fibrillation. He's not had an episode for several months.  Longest episode lasted for several hours. He uses his blood pressure cuff and this reports an abnormal rhythm. At this time he would take diltiazem. His been compliant with his metoprolol daily Blood pressure typically runs in a good range, often 110-120 systolic.  He is been exercising on a regular basis Chads vascular score of 1. This was filled out with him while in the clinic with online calculator for his awareness  EKG on today's visit shows normal sinus rhythm with rate 74 bpm, no significant ST or T-wave changes  Other past medical history Seen by his primary care physician, Dr. Sharen Hones, on 09/19/2013, noted to be in atrial fibrillation with rate in the 140 range. He was relatively asymptomatic. rate improved back down to normal on October 18.    he had asymptomatic atrial fibrillation in January 2014. He did have some mild malaise, bloating in his chest. He was found on routine physical exam by Dr. Alison Murray to have a heart rate of 140 beats per minute. He was referred to [facility] cardiology, started on metoprolol. He had echocardiogram at that time which was essentially normal. Also had stress test with normal treadmill  He has been taking a cholesterol medication for at least 14 years in terms of his family history, father had heart attack at age 47 and died. He was a long-time smoker. Mother lived to 69  No Known  Allergies  Outpatient Encounter Prescriptions as of 04/03/2015  Medication Sig  . aspirin 162 MG EC tablet Take 162 mg by mouth daily.  Marland Kitchen atorvastatin (LIPITOR) 40 MG tablet Take 1 tablet (40 mg total) by mouth at bedtime.  . Cholecalciferol (VITAMIN D-3) 1000 UNITS CAPS Take 2,000 capsules by mouth 2 (two) times daily.  . Coenzyme Q10 (CO Q-10) 100 MG CAPS Take 100 mg by mouth daily.  Marland Kitchen diltiazem (CARDIZEM) 30 MG tablet Take 1 tablet (30 mg total) by mouth 4 (four) times daily as needed.  Marland Kitchen glucose blood test strip 1 each by Other route daily. Check sugars once daily or as needed Dx:790.29  . Lancets (ONETOUCH ULTRASOFT) lancets USE AS INSTRUCTED  . lansoprazole (PREVACID) 15 MG capsule Take 15 mg by mouth daily.   . Melatonin 5 MG TABS Take 10 mg by mouth at bedtime.   . metoprolol succinate (TOPROL-XL) 50 MG 24 hr tablet Take 1 tablet (50 mg total) by mouth daily.  . Multiple Vitamin (MULTIVITAMIN) tablet Take 1 tablet by mouth daily.  Marland Kitchen omega-3 fish oil (MAXEPA) 1000 MG CAPS capsule Take 2 capsules by mouth 2 (two) times daily.  . ONE TOUCH ULTRA TEST test strip USE TO CHECK SUGARS ONCE DAILY OR AS NEEDED  . sertraline (ZOLOFT) 100 MG tablet Take 1 tablet (100 mg total) by mouth daily.    Past Medical History  Diagnosis Date  . Atrial fibrillation   . Hypertension   . Hyperlipidemia   . Prediabetes     treated  with diet & exercise. [facility]  . GERD (gastroesophageal reflux disease)   . Depression with anxiety   . Nonalcoholic fatty liver disease     ?h/o elevated LFTs, has not had US  . History of chicken pox     Past Surgical History  Procedure Laterality Date  . Knee surgery Right 2009    x 2 (Dr. Ernest PineHooten)    Social History  reports that he quit smoking about 32 years ago. His smoking use included Cigars. He has never used smokeless tobacco. He reports that he drinks about 1.2 oz of alcohol per week. He reports that he does not use illicit  drugs.  Family History family history includes CAD (age of onset: 642) in his father; CAD (age of onset: 8659) in his paternal uncle; Cancer in his maternal uncle; Diabetes in his paternal grandmother; Hypertension in his mother; Stroke in his paternal aunt.   Review of Systems  Constitutional: Negative.   Respiratory: Negative.   Cardiovascular: Negative.   Gastrointestinal: Negative.   Musculoskeletal: Negative.   Skin: Negative.   Neurological: Negative.   Hematological: Negative.   Psychiatric/Behavioral: Negative.   All other systems reviewed and are negative.   BP 148/82 mmHg  Pulse 72  Ht 5\' 11"  (1.803 m)  Wt 217 lb 12 oz (98.771 kg)  BMI 30.38 kg/m2  Physical Exam  Constitutional: He is oriented to person, place, and time. He appears well-developed and well-nourished.  HENT:  Head: Normocephalic.  Nose: Nose normal.  Mouth/Throat: Oropharynx is clear and moist.  Eyes: Conjunctivae are normal. Pupils are equal, round, and reactive to light.  Neck: Normal range of motion. Neck supple. No JVD present.  Cardiovascular: Normal rate, regular rhythm, S1 normal, S2 normal, normal heart sounds and intact distal pulses.  Exam reveals no gallop and no friction rub.   No murmur heard. Pulmonary/Chest: Effort normal and breath sounds normal. No respiratory distress. He has no wheezes. He has no rales. He exhibits no tenderness.  Abdominal: Soft. Bowel sounds are normal. He exhibits no distension. There is no tenderness.  Musculoskeletal: Normal range of motion. He exhibits no edema or tenderness.  Lymphadenopathy:    He has no cervical adenopathy.  Neurological: He is alert and oriented to person, place, and time. Coordination normal.  Skin: Skin is warm and dry. No rash noted. No erythema.  Psychiatric: He has a normal mood and affect. His behavior is normal. Judgment and thought content normal.      Assessment and Plan   Nursing note and vitals reviewed.

## 2015-04-03 NOTE — Assessment & Plan Note (Signed)
Long discussion today concerning atrial fibrillation and went to anticoagulate. Chads VASC  discussed with him. His score is 1. Suggested he closely track his arrhythmia and contact our office if this happens more frequently

## 2015-04-03 NOTE — Patient Instructions (Signed)
You are doing well. No medication changes were made.  Please call us if you have new issues that need to be addressed before your next appt.  Your physician wants you to follow-up in: 12 months.  You will receive a reminder letter in the mail two months in advance. If you don't receive a letter, please call our office to schedule the follow-up appointment. 

## 2015-04-03 NOTE — Assessment & Plan Note (Signed)
Recommended dietary changes, increased exercise in an effort to lose weight

## 2015-04-03 NOTE — Assessment & Plan Note (Signed)
We have encouraged continued exercise, careful diet management in an effort to lose weight. 

## 2015-04-03 NOTE — Assessment & Plan Note (Signed)
No changes to the medications were made. He will have routine lab work with primary care

## 2015-04-03 NOTE — Assessment & Plan Note (Signed)
Blood pressure is well controlled on today's visit. No changes made to the medications. 

## 2015-04-10 ENCOUNTER — Encounter: Payer: Self-pay | Admitting: Family Medicine

## 2015-04-15 LAB — HM DIABETES EYE EXAM

## 2015-04-17 ENCOUNTER — Encounter: Payer: Self-pay | Admitting: Family Medicine

## 2015-04-18 ENCOUNTER — Other Ambulatory Visit: Payer: Self-pay | Admitting: Family Medicine

## 2015-04-18 DIAGNOSIS — E785 Hyperlipidemia, unspecified: Secondary | ICD-10-CM

## 2015-04-18 DIAGNOSIS — Z125 Encounter for screening for malignant neoplasm of prostate: Secondary | ICD-10-CM

## 2015-04-18 DIAGNOSIS — K760 Fatty (change of) liver, not elsewhere classified: Secondary | ICD-10-CM

## 2015-04-20 ENCOUNTER — Other Ambulatory Visit (INDEPENDENT_AMBULATORY_CARE_PROVIDER_SITE_OTHER): Payer: BLUE CROSS/BLUE SHIELD

## 2015-04-20 DIAGNOSIS — R7303 Prediabetes: Secondary | ICD-10-CM

## 2015-04-20 DIAGNOSIS — E785 Hyperlipidemia, unspecified: Secondary | ICD-10-CM

## 2015-04-20 DIAGNOSIS — Z125 Encounter for screening for malignant neoplasm of prostate: Secondary | ICD-10-CM | POA: Diagnosis not present

## 2015-04-20 DIAGNOSIS — R7309 Other abnormal glucose: Secondary | ICD-10-CM | POA: Diagnosis not present

## 2015-04-20 DIAGNOSIS — K760 Fatty (change of) liver, not elsewhere classified: Secondary | ICD-10-CM

## 2015-04-20 LAB — HEPATIC FUNCTION PANEL
ALT: 35 U/L (ref 0–53)
AST: 31 U/L (ref 0–37)
Albumin: 4.2 g/dL (ref 3.5–5.2)
Alkaline Phosphatase: 87 U/L (ref 39–117)
BILIRUBIN TOTAL: 0.6 mg/dL (ref 0.2–1.2)
Bilirubin, Direct: 0.1 mg/dL (ref 0.0–0.3)
Total Protein: 6.5 g/dL (ref 6.0–8.3)

## 2015-04-20 LAB — PSA: PSA: 1.88 ng/mL (ref 0.10–4.00)

## 2015-04-20 LAB — BASIC METABOLIC PANEL
BUN: 23 mg/dL (ref 6–23)
CO2: 32 mEq/L (ref 19–32)
CREATININE: 1.16 mg/dL (ref 0.40–1.50)
Calcium: 9.7 mg/dL (ref 8.4–10.5)
Chloride: 104 mEq/L (ref 96–112)
GFR: 67.69 mL/min (ref >60.00)
Glucose, Bld: 129 mg/dL — ABNORMAL HIGH (ref 70–99)
POTASSIUM: 4.6 meq/L (ref 3.5–5.1)
Sodium: 141 mEq/L (ref 135–145)

## 2015-04-20 LAB — LIPID PANEL
CHOL/HDL RATIO: 4
Cholesterol: 147 mg/dL (ref 0–200)
HDL: 37.7 mg/dL — ABNORMAL LOW (ref >39.00)
LDL CALC: 78 mg/dL (ref 0–99)
NonHDL: 109.3
Triglycerides: 156 mg/dL — ABNORMAL HIGH (ref 0.0–149.0)
VLDL: 31.2 mg/dL (ref 0.0–40.0)

## 2015-04-20 LAB — HEMOGLOBIN A1C: Hgb A1c MFr Bld: 6.2 % (ref 4.6–6.5)

## 2015-04-24 ENCOUNTER — Encounter: Payer: Self-pay | Admitting: Family Medicine

## 2015-04-27 ENCOUNTER — Encounter: Payer: Self-pay | Admitting: Family Medicine

## 2015-04-27 ENCOUNTER — Ambulatory Visit (INDEPENDENT_AMBULATORY_CARE_PROVIDER_SITE_OTHER): Payer: BLUE CROSS/BLUE SHIELD | Admitting: Family Medicine

## 2015-04-27 VITALS — BP 126/80 | HR 83 | Temp 98.2°F | Ht 70.0 in | Wt 215.8 lb

## 2015-04-27 DIAGNOSIS — R7303 Prediabetes: Secondary | ICD-10-CM

## 2015-04-27 DIAGNOSIS — E669 Obesity, unspecified: Secondary | ICD-10-CM

## 2015-04-27 DIAGNOSIS — E785 Hyperlipidemia, unspecified: Secondary | ICD-10-CM

## 2015-04-27 DIAGNOSIS — Z0000 Encounter for general adult medical examination without abnormal findings: Secondary | ICD-10-CM | POA: Diagnosis not present

## 2015-04-27 DIAGNOSIS — I480 Paroxysmal atrial fibrillation: Secondary | ICD-10-CM

## 2015-04-27 DIAGNOSIS — K760 Fatty (change of) liver, not elsewhere classified: Secondary | ICD-10-CM

## 2015-04-27 DIAGNOSIS — I10 Essential (primary) hypertension: Secondary | ICD-10-CM

## 2015-04-27 DIAGNOSIS — F331 Major depressive disorder, recurrent, moderate: Secondary | ICD-10-CM

## 2015-04-27 MED ORDER — SERTRALINE HCL 100 MG PO TABS: 100.0000 mg | ORAL_TABLET | Freq: Every day | ORAL | Status: DC

## 2015-04-27 MED ORDER — ATORVASTATIN CALCIUM 40 MG PO TABS: 40.0000 mg | ORAL_TABLET | Freq: Every day | ORAL | Status: DC

## 2015-04-27 MED ORDER — METOPROLOL SUCCINATE ER 50 MG PO TB24: 50.0000 mg | ORAL_TABLET | Freq: Every day | ORAL | Status: DC

## 2015-04-27 MED ORDER — GLUCOSE BLOOD VI STRP: ORAL_STRIP | Status: DC

## 2015-04-27 MED ORDER — SERTRALINE HCL 50 MG PO TABS: 50.0000 mg | ORAL_TABLET | Freq: Every day | ORAL | Status: DC

## 2015-04-27 NOTE — Patient Instructions (Addendum)
Try 50mg  zoloft for next month then decide if we will continue 50mg  or 100mg  (Rx printed for both). Keep an eye on your rhythm - if more frequently irregular, update Dr Mariah MillingGollan.  Good to see you today, call us with questions.

## 2015-04-27 NOTE — Assessment & Plan Note (Signed)
Discussed healthy diet and lifestyle changes to affect sustainable weight loss  

## 2015-04-27 NOTE — Assessment & Plan Note (Signed)
Reviewed A1c and glu with patient.

## 2015-04-27 NOTE — Assessment & Plan Note (Signed)
transaminitis has resolved. Continue to monitor.

## 2015-04-27 NOTE — Assessment & Plan Note (Signed)
Chronic, stable. Continue regimen. 

## 2015-04-27 NOTE — Progress Notes (Signed)
BP 126/80 mmHg  Pulse 83  Temp(Src) 98.2 F (36.8 C) (Oral)  Ht  (1.778 m)  Wt 215 lb 12 oz (97.864 kg)  BMI 30.96 kg/m2  SpO2 97%   CC: CPE  Subjective:    Patient ID: Jonathon Middleton, male    DOB: [birth year], 64 y.o.   MRN: [id number]  HPI: Jonathon Middleton is a 64 y.o. male presenting on 04/27/2015 for Annual Exam   H/o parox afib followed by Dr Mariah Milling. On PRN Dilt, but rare use. Does take aspirin  BID.  Depression - zoloft working well. Interested in decreased dose. Has been on prozac, venlafaxine, pristiq. Paxil caused weight gain.   Eye exam - with Dr Dorcas Mcmurray yearly.  Preventative: Colonoscopy - 2008 ([place]) WNL, rpt 10 yrs. Prostate cancer screening - desires to continue screening regularly. Flu not done Pneumovax 10/2013 Tdap - 04/2014 zostavax 10/2013 Seat belt use discussed Sunscreen use discussed  Lives with wife, son, cats and dogs Divorced x1 Occupation: Corporate treasurer - into genetics, [employer] Activity: treadmill, increased strength training last summer. Diet: good water, seldom fruits/vegetables, fish weekly, has seen nutritionist  Relevant past medical, surgical, family and social history reviewed and updated as indicated. Interim medical history since our last visit reviewed. Allergies and medications reviewed and updated. Current Outpatient Prescriptions on File Prior to Visit  Medication Sig  . aspirin 162 MG EC tablet Take 81 mg by mouth 2 (two) times daily.   . Cholecalciferol (VITAMIN D-3) 1000 UNITS CAPS Take 2,000 capsules by mouth 2 (two) times daily.  . Coenzyme Q10 (CO Q-10) 100 MG CAPS Take 100 mg by mouth daily.  Marland Kitchen diltiazem (CARDIZEM) 30 MG tablet Take 1 tablet (30 mg total) by mouth 4 (four) times daily as needed.  . Lancets (ONETOUCH ULTRASOFT) lancets USE AS INSTRUCTED  . lansoprazole (PREVACID) 15 MG capsule Take 15 mg by mouth daily.   . Melatonin 5 MG TABS Take 10 mg by mouth at  bedtime.   . Multiple Vitamin (MULTIVITAMIN) tablet Take 1 tablet by mouth daily.  Marland Kitchen omega-3 fish oil (MAXEPA) 1000 MG CAPS capsule Take 2 capsules by mouth 2 (two) times daily.   No current facility-administered medications on file prior to visit.    Review of Systems  Constitutional: Negative for fever, chills, activity change, appetite change, fatigue and unexpected weight change.  HENT: Negative for hearing loss.   Eyes: Negative for visual disturbance.  Respiratory: Negative for cough, chest tightness, shortness of breath and wheezing.   Cardiovascular: Negative for chest pain, palpitations and leg swelling.  Gastrointestinal: Negative for nausea, vomiting, abdominal pain, diarrhea, constipation, blood in stool and abdominal distention.  Genitourinary: Negative for hematuria and difficulty urinating.  Musculoskeletal: Negative for myalgias, arthralgias and neck pain.  Skin: Negative for rash.  Neurological: Negative for dizziness, seizures, syncope and headaches.  Hematological: Negative for adenopathy. Does not bruise/bleed easily.  Psychiatric/Behavioral: Negative for dysphoric mood. The patient is not nervous/anxious.    Per HPI unless specifically indicated above     Objective:    BP 126/80 mmHg  Pulse 83  Temp(Src) 98.2 F (36.8 C) (Oral)  Ht  (1.778 m)  Wt 215 lb 12 oz (97.864 kg)  BMI 30.96 kg/m2  SpO2 97%  Wt Readings from Last 3 Encounters:  04/27/15 215 lb 12 oz (97.864 kg)  04/03/15 217 lb 12 oz (98.771 kg)  04/17/14 220 lb 8 oz (100.018 kg)    Physical Exam  Constitutional: He is oriented to person, place, and time. He appears well-developed and well-nourished. No distress.  HENT:  Head: Normocephalic and atraumatic.  Right Ear: Hearing, tympanic membrane, external ear and ear canal normal.  Left Ear: Hearing, tympanic membrane, external ear and ear canal normal.  Nose: Nose normal.  Mouth/Throat: Uvula is midline, oropharynx is clear and moist and  mucous membranes are normal. No oropharyngeal exudate, posterior oropharyngeal edema or posterior oropharyngeal erythema.  Eyes: Conjunctivae and EOM are normal. Pupils are equal, round, and reactive to light. No scleral icterus.  Neck: Normal range of motion. Neck supple. Carotid bruit is not present. No thyromegaly present.  Cardiovascular: Normal rate, regular rhythm, normal heart sounds and intact distal pulses.   No murmur heard. Pulses:      Radial pulses are 2+ on the right side, and 2+ on the left side.  Pulmonary/Chest: Effort normal and breath sounds normal. No respiratory distress. He has no wheezes. He has no rales.  Abdominal: Soft. Bowel sounds are normal. He exhibits no distension and no mass. There is no tenderness. There is no rebound and no guarding.  Genitourinary: Rectum normal and prostate normal. Rectal exam shows no external hemorrhoid, no internal hemorrhoid, no fissure, no mass, no tenderness and anal tone normal. Prostate is not enlarged (15gm) and not tender.  Musculoskeletal: Normal range of motion. He exhibits no edema.  Lymphadenopathy:    He has no cervical adenopathy.  Neurological: He is alert and oriented to person, place, and time.  CN grossly intact, station and gait intact  Skin: Skin is warm and dry. No rash noted.  Psychiatric: He has a normal mood and affect. His behavior is normal. Judgment and thought content normal.  Nursing note and vitals reviewed.  Results for orders placed or performed in visit on 04/20/15  Lipid panel  Result Value Ref Range   Cholesterol 147 0 - 200 mg/dL   Triglycerides 161.0 (H) 0.0 - 149.0 mg/dL   HDL 96.04 (L) >54.09 mg/dL   VLDL 81.1 0.0 - 91.4 mg/dL   LDL Cholesterol 78 0 - 99 mg/dL   Total CHOL/HDL Ratio 4    NonHDL 109.30   Basic metabolic panel  Result Value Ref Range   Sodium 141 135 - 145 mEq/L   Potassium 4.6 3.5 - 5.1 mEq/L   Chloride 104 96 - 112 mEq/L   CO2 32 19 - 32 mEq/L   Glucose, Bld 129 (H) 70 -  99 mg/dL   BUN 23 6 - 23 mg/dL   Creatinine, Ser 7.82 0.40 - 1.50 mg/dL   Calcium 9.7 8.4 - 95.6 mg/dL   GFR 21.30 >86.57 mL/min  Hemoglobin A1c  Result Value Ref Range   Hgb A1c MFr Bld 6.2 4.6 - 6.5 %  Hepatic function panel  Result Value Ref Range   Total Bilirubin 0.6 0.2 - 1.2 mg/dL   Bilirubin, Direct 0.1 0.0 - 0.3 mg/dL   Alkaline Phosphatase 87 39 - 117 U/L   AST 31 0 - 37 U/L   ALT 35 0 - 53 U/L   Total Protein 6.5 6.0 - 8.3 g/dL   Albumin 4.2 3.5 - 5.2 g/dL  PSA  Result Value Ref Range   PSA 1.88 0.10 - 4.00 ng/mL      Assessment & Plan:   Problem List Items Addressed This Visit    Essential hypertension    Chronic, stable. Continue toprol xl.       Relevant Medications   atorvastatin (LIPITOR)  40 MG tablet   metoprolol succinate (TOPROL-XL) 50 MG 24 hr tablet   Health maintenance examination - Primary    Preventative protocols reviewed and updated unless pt declined. Discussed healthy diet and lifestyle.       Hyperlipidemia    Chronic, stable. Continue regimen.      Relevant Medications   atorvastatin (LIPITOR) 40 MG tablet   metoprolol succinate (TOPROL-XL) 50 MG 24 hr tablet   MDD (major depressive disorder), recurrent episode    Doing better. Will try lower sertraline dose 50mg  daily x1 mo then decide on dosing (100mg  vs 50mg ).  Continue staying active.      Relevant Medications   sertraline (ZOLOFT) 50 MG tablet   Nonalcoholic fatty liver disease    transaminitis has resolved. Continue to monitor.      Obesity    Discussed healthy diet and lifestyle changes to affect sustainable weight loss.      Paroxysmal atrial fibrillation    Rate controlled atrial fibrillation today. I've discussed with patient and taught how to check for regularity of heart beat. Suggested he check once a week over next month and then update Dr Mariah MillingGollan if persistently in atrial fibrillation. Low chadsvasc2 score      Relevant Medications   atorvastatin (LIPITOR) 40  MG tablet   metoprolol succinate (TOPROL-XL) 50 MG 24 hr tablet   Prediabetes    Reviewed A1c and glu with patient.           Follow up plan: Return in about 1 year (around 04/26/2016), or as needed, for annual exam, prior fasting for blood work.

## 2015-04-27 NOTE — Assessment & Plan Note (Signed)
Preventative protocols reviewed and updated unless pt declined. Discussed healthy diet and lifestyle.  

## 2015-04-27 NOTE — Assessment & Plan Note (Addendum)
Rate controlled atrial fibrillation today. I've discussed with patient and taught how to check for regularity of heart beat. Suggested he check once a week over next month and then update Dr Mariah MillingGollan if persistently in atrial fibrillation. Low chadsvasc2 score

## 2015-04-27 NOTE — Progress Notes (Signed)
Pre visit review using our clinic review tool, if applicable. No additional management support is needed unless otherwise documented below in the visit note. 

## 2015-04-27 NOTE — Assessment & Plan Note (Signed)
Chronic, stable. Continue toprol xl  

## 2015-04-27 NOTE — Assessment & Plan Note (Addendum)
Doing better. Will try lower sertraline dose 50mg  daily x1 mo then decide on dosing (100mg  vs 50mg ).  Continue staying active.

## 2015-05-26 ENCOUNTER — Ambulatory Visit (INDEPENDENT_AMBULATORY_CARE_PROVIDER_SITE_OTHER): Payer: BLUE CROSS/BLUE SHIELD | Admitting: Podiatry

## 2015-05-26 ENCOUNTER — Ambulatory Visit (INDEPENDENT_AMBULATORY_CARE_PROVIDER_SITE_OTHER): Payer: BLUE CROSS/BLUE SHIELD

## 2015-05-26 VITALS — BP 110/60 | HR 104 | Resp 16 | Ht 71.0 in | Wt 216.0 lb

## 2015-05-26 DIAGNOSIS — M722 Plantar fascial fibromatosis: Secondary | ICD-10-CM | POA: Diagnosis not present

## 2015-05-26 DIAGNOSIS — M7661 Achilles tendinitis, right leg: Secondary | ICD-10-CM | POA: Diagnosis not present

## 2015-05-26 NOTE — Patient Instructions (Signed)
Plantar Fasciitis (Heel Spur Syndrome) with Rehab The plantar fascia is a fibrous, ligament-like, soft-tissue structure that spans the bottom of the foot. Plantar fasciitis is a condition that causes pain in the foot due to inflammation of the tissue. SYMPTOMS   Pain and tenderness on the underneath side of the foot.  Pain that worsens with standing or walking. CAUSES  Plantar fasciitis is caused by irritation and injury to the plantar fascia on the underneath side of the foot. Common mechanisms of injury include:  Direct trauma to bottom of the foot.  Damage to a small nerve that runs under the foot where the main fascia attaches to the heel bone.  Stress placed on the plantar fascia due to bone spurs. RISK INCREASES WITH:   Activities that place stress on the plantar fascia (running, jumping, pivoting, or cutting).  Poor strength and flexibility.  Improperly fitted shoes.  Tight calf muscles.  Flat feet.  Failure to warm-up properly before activity.  Obesity. PREVENTION  Warm up and stretch properly before activity.  Allow for adequate recovery between workouts.  Maintain physical fitness:  Strength, flexibility, and endurance.  Cardiovascular fitness.  Maintain a health body weight.  Avoid stress on the plantar fascia.  Wear properly fitted shoes, including arch supports for individuals who have flat feet. PROGNOSIS  If treated properly, then the symptoms of plantar fasciitis usually resolve without surgery. However, occasionally surgery is necessary. RELATED COMPLICATIONS   Recurrent symptoms that may result in a chronic condition.  Problems of the lower back that are caused by compensating for the injury, such as limping.  Pain or weakness of the foot during push-off following surgery.  Chronic inflammation, scarring, and partial or complete fascia tear, occurring more often from repeated injections. TREATMENT  Treatment initially involves the use of  ice and medication to help reduce pain and inflammation. The use of strengthening and stretching exercises may help reduce pain with activity, especially stretches of the Achilles tendon. These exercises may be performed at home or with a therapist. Your caregiver may recommend that you use heel cups of arch supports to help reduce stress on the plantar fascia. Occasionally, corticosteroid injections are given to reduce inflammation. If symptoms persist for greater than 6 months despite non-surgical (conservative), then surgery may be recommended.  MEDICATION   If pain medication is necessary, then nonsteroidal anti-inflammatory medications, such as aspirin and ibuprofen, or other minor pain relievers, such as acetaminophen, are often recommended.  Do not take pain medication within 7 days before surgery.  Prescription pain relievers may be given if deemed necessary by your caregiver. Use only as directed and only as much as you need.  Corticosteroid injections may be given by your caregiver. These injections should be reserved for the most serious cases, because they may only be given a certain number of times. HEAT AND COLD  Cold treatment (icing) relieves pain and reduces inflammation. Cold treatment should be applied for 10 to 15 minutes every 2 to 3 hours for inflammation and pain and immediately after any activity that aggravates your symptoms. Use ice packs or massage the area with a piece of ice (ice massage).  Heat treatment may be used prior to performing the stretching and strengthening activities prescribed by your caregiver, physical therapist, or athletic trainer. Use a heat pack or soak the injury in warm water. SEEK IMMEDIATE MEDICAL CARE IF:  Treatment seems to offer no benefit, or the condition worsens.  Any medications produce adverse side effects. EXERCISES RANGE   OF MOTION (ROM) AND STRETCHING EXERCISES - Plantar Fasciitis (Heel Spur Syndrome) These exercises may help you  when beginning to rehabilitate your injury. Your symptoms may resolve with or without further involvement from your physician, physical therapist or athletic trainer. While completing these exercises, remember:   Restoring tissue flexibility helps normal motion to return to the joints. This allows healthier, less painful movement and activity.  An effective stretch should be held for at least 30 seconds.  A stretch should never be painful. You should only feel a gentle lengthening or release in the stretched tissue. RANGE OF MOTION - Toe Extension, Flexion  Sit with your right / left leg crossed over your opposite knee.  Grasp your toes and gently pull them back toward the top of your foot. You should feel a stretch on the bottom of your toes and/or foot.  Hold this stretch for __________ seconds.  Now, gently pull your toes toward the bottom of your foot. You should feel a stretch on the top of your toes and or foot.  Hold this stretch for __________ seconds. Repeat __________ times. Complete this stretch __________ times per day.  RANGE OF MOTION - Ankle Dorsiflexion, Active Assisted  Remove shoes and sit on a chair that is preferably not on a carpeted surface.  Place right / left foot under knee. Extend your opposite leg for support.  Keeping your heel down, slide your right / left foot back toward the chair until you feel a stretch at your ankle or calf. If you do not feel a stretch, slide your bottom forward to the edge of the chair, while still keeping your heel down.  Hold this stretch for __________ seconds. Repeat __________ times. Complete this stretch __________ times per day.  STRETCH - Gastroc, Standing  Place hands on wall.  Extend right / left leg, keeping the front knee somewhat bent.  Slightly point your toes inward on your back foot.  Keeping your right / left heel on the floor and your knee straight, shift your weight toward the wall, not allowing your back to  arch.  You should feel a gentle stretch in the right / left calf. Hold this position for __________ seconds. Repeat __________ times. Complete this stretch __________ times per day. STRETCH - Soleus, Standing  Place hands on wall.  Extend right / left leg, keeping the other knee somewhat bent.  Slightly point your toes inward on your back foot.  Keep your right / left heel on the floor, bend your back knee, and slightly shift your weight over the back leg so that you feel a gentle stretch deep in your back calf.  Hold this position for __________ seconds. Repeat __________ times. Complete this stretch __________ times per day. STRETCH - Gastrocsoleus, Standing  Note: This exercise can place a lot of stress on your foot and ankle. Please complete this exercise only if specifically instructed by your caregiver.   Place the ball of your right / left foot on a step, keeping your other foot firmly on the same step.  Hold on to the wall or a rail for balance.  Slowly lift your other foot, allowing your body weight to press your heel down over the edge of the step.  You should feel a stretch in your right / left calf.  Hold this position for __________ seconds.  Repeat this exercise with a slight bend in your right / left knee. Repeat __________ times. Complete this stretch __________ times per day.    STRENGTHENING EXERCISES - Plantar Fasciitis (Heel Spur Syndrome)  These exercises may help you when beginning to rehabilitate your injury. They may resolve your symptoms with or without further involvement from your physician, physical therapist or athletic trainer. While completing these exercises, remember:   Muscles can gain both the endurance and the strength needed for everyday activities through controlled exercises.  Complete these exercises as instructed by your physician, physical therapist or athletic trainer. Progress the resistance and repetitions only as guided. STRENGTH -  Towel Curls  Sit in a chair positioned on a non-carpeted surface.  Place your foot on a towel, keeping your heel on the floor.  Pull the towel toward your heel by only curling your toes. Keep your heel on the floor.  If instructed by your physician, physical therapist or athletic trainer, add ____________________ at the end of the towel. Repeat __________ times. Complete this exercise __________ times per day. STRENGTH - Ankle Inversion  Secure one end of a rubber exercise band/tubing to a fixed object (table, pole). Loop the other end around your foot just before your toes.  Place your fists between your knees. This will focus your strengthening at your ankle.  Slowly, pull your big toe up and in, making sure the band/tubing is positioned to resist the entire motion.  Hold this position for __________ seconds.  Have your muscles resist the band/tubing as it slowly pulls your foot back to the starting position. Repeat __________ times. Complete this exercises __________ times per day.  Document Released: 11/21/2005 Document Revised: 02/13/2012 Document Reviewed: 03/05/2009 [employer] Patient Information 2015 [employer]. This information is not intended to replace advice given to you by your health care provider. Make sure you discuss any questions you have with your health care provider. Achilles Tendinitis  with Rehab Achilles tendinitis is a disorder of the Achilles tendon. The Achilles tendon connects the large calf muscles (Gastrocnemius and Soleus) to the heel bone (calcaneus). This tendon is sometimes called the heel cord. It is important for pushing-off and standing on your toes and is important for walking, running, or jumping. Tendinitis is often caused by overuse and repetitive microtrauma. SYMPTOMS  Pain, tenderness, swelling, warmth, and redness may occur over the Achilles tendon even at rest.  Pain with pushing off, or flexing or extending the ankle.  Pain that is  worsened after or during activity. CAUSES   Overuse sometimes seen with rapid increase in exercise programs or in sports requiring running and jumping.  Poor physical conditioning (strength and flexibility or endurance).  Running sports, especially training running down hills.  Inadequate warm-up before practice or play or failure to stretch before participation.  Injury to the tendon. PREVENTION   Warm up and stretch before practice or competition.  Allow time for adequate rest and recovery between practices and competition.  Keep up conditioning.  Keep up ankle and leg flexibility.  Improve or keep muscle strength and endurance.  Improve cardiovascular fitness.  Use proper technique.  Use proper equipment (shoes, skates).  To help prevent recurrence, taping, protective strapping, or an adhesive bandage may be recommended for several weeks after healing is complete. PROGNOSIS   Recovery may take weeks to several months to heal.  Longer recovery is expected if symptoms have been prolonged.  Recovery is usually quicker if the inflammation is due to a direct blow as compared with overuse or sudden strain. RELATED COMPLICATIONS   Healing time will be prolonged if the condition is not correctly treated. The injury must   be given plenty of time to heal.  Symptoms can reoccur if activity is resumed too soon.  Untreated, tendinitis may increase the risk of tendon rupture requiring additional time for recovery and possibly surgery. TREATMENT   The first treatment consists of rest anti-inflammatory medication, and ice to relieve the pain.  Stretching and strengthening exercises after resolution of pain will likely help reduce the risk of recurrence. Referral to a physical therapist or athletic trainer for further evaluation and treatment may be helpful.  A walking boot or cast may be recommended to rest the Achilles tendon. This can help break the cycle of inflammation and  microtrauma.  Arch supports (orthotics) may be prescribed or recommended by your caregiver as an adjunct to therapy and rest.  Surgery to remove the inflamed tendon lining or degenerated tendon tissue is rarely necessary and has shown less than predictable results. MEDICATION   Nonsteroidal anti-inflammatory medications, such as aspirin and ibuprofen, may be used for pain and inflammation relief. Do not take within 7 days before surgery. Take these as directed by your caregiver. Contact your caregiver immediately if any bleeding, stomach upset, or signs of allergic reaction occur. Other minor pain relievers, such as acetaminophen, may also be used.  Pain relievers may be prescribed as necessary by your caregiver. Do not take prescription pain medication for longer than 4 to 7 days. Use only as directed and only as much as you need.  Cortisone injections are rarely indicated. Cortisone injections may weaken tendons and predispose to rupture. It is better to give the condition more time to heal than to use them. HEAT AND COLD  Cold is used to relieve pain and reduce inflammation for acute and chronic Achilles tendinitis. Cold should be applied for 10 to 15 minutes every 2 to 3 hours for inflammation and pain and immediately after any activity that aggravates your symptoms. Use ice packs or an ice massage.  Heat may be used before performing stretching and strengthening activities prescribed by your caregiver. Use a heat pack or a warm soak. SEEK MEDICAL CARE IF:  Symptoms get worse or do not improve in 2 weeks despite treatment.  New, unexplained symptoms develop. Drugs used in treatment may produce side effects. EXERCISES RANGE OF MOTION (ROM) AND STRETCHING EXERCISES - Achilles Tendinitis  These exercises may help you when beginning to rehabilitate your injury. Your symptoms may resolve with or without further involvement from your physician, physical therapist or athletic trainer. While  completing these exercises, remember:   Restoring tissue flexibility helps normal motion to return to the joints. This allows healthier, less painful movement and activity.  An effective stretch should be held for at least 30 seconds.  A stretch should never be painful. You should only feel a gentle lengthening or release in the stretched tissue. STRETCH - Gastroc, Standing   Place hands on wall.  Extend right / left leg, keeping the front knee somewhat bent.  Slightly point your toes inward on your back foot.  Keeping your right / left heel on the floor and your knee straight, shift your weight toward the wall, not allowing your back to arch.  You should feel a gentle stretch in the right / left calf. Hold this position for __________ seconds. Repeat __________ times. Complete this stretch __________ times per day. STRETCH - Soleus, Standing   Place hands on wall.  Extend right / left leg, keeping the other knee somewhat bent.  Slightly point your toes inward on your back foot.    Keep your right / left heel on the floor, bend your back knee, and slightly shift your weight over the back leg so that you feel a gentle stretch deep in your back calf.  Hold this position for __________ seconds. Repeat __________ times. Complete this stretch __________ times per day. STRETCH - Gastrocsoleus, Standing  Note: This exercise can place a lot of stress on your foot and ankle. Please complete this exercise only if specifically instructed by your caregiver.   Place the ball of your right / left foot on a step, keeping your other foot firmly on the same step.  Hold on to the wall or a rail for balance.  Slowly lift your other foot, allowing your body weight to press your heel down over the edge of the step.  You should feel a stretch in your right / left calf.  Hold this position for __________ seconds.  Repeat this exercise with a slight bend in your knee. Repeat __________ times.  Complete this stretch __________ times per day.  STRENGTHENING EXERCISES - Achilles Tendinitis These exercises may help you when beginning to rehabilitate your injury. They may resolve your symptoms with or without further involvement from your physician, physical therapist or athletic trainer. While completing these exercises, remember:   Muscles can gain both the endurance and the strength needed for everyday activities through controlled exercises.  Complete these exercises as instructed by your physician, physical therapist or athletic trainer. Progress the resistance and repetitions only as guided.  You may experience muscle soreness or fatigue, but the pain or discomfort you are trying to eliminate should never worsen during these exercises. If this pain does worsen, stop and make certain you are following the directions exactly. If the pain is still present after adjustments, discontinue the exercise until you can discuss the trouble with your clinician. STRENGTH - Plantar-flexors   Sit with your right / left leg extended. Holding onto both ends of a rubber exercise band/tubing, loop it around the ball of your foot. Keep a slight tension in the band.  Slowly push your toes away from you, pointing them downward.  Hold this position for __________ seconds. Return slowly, controlling the tension in the band/tubing. Repeat __________ times. Complete this exercise __________ times per day.  STRENGTH - Plantar-flexors   Stand with your feet shoulder width apart. Steady yourself with a wall or table using as little support as needed.  Keeping your weight evenly spread over the width of your feet, rise up on your toes.*  Hold this position for __________ seconds. Repeat __________ times. Complete this exercise __________ times per day.  *If this is too easy, shift your weight toward your right / left leg until you feel challenged. Ultimately, you may be asked to do this exercise with your  right / left foot only. STRENGTH - Plantar-flexors, Eccentric  Note: This exercise can place a lot of stress on your foot and ankle. Please complete this exercise only if specifically instructed by your caregiver.   Place the balls of your feet on a step. With your hands, use only enough support from a wall or rail to keep your balance.  Keep your knees straight and rise up on your toes.  Slowly shift your weight entirely to your right / left toes and pick up your opposite foot. Gently and with controlled movement, lower your weight through your right / left foot so that your heel drops below the level of the step. You will feel a   slight stretch in the back of your calf at the end position.  Use the healthy leg to help rise up onto the balls of both feet, then lower weight only on the right / left leg again. Build up to 15 repetitions. Then progress to 3 consecutive sets of 15 repetitions.*  After completing the above exercise, complete the same exercise with a slight knee bend (about 30 degrees). Again, build up to 15 repetitions. Then progress to 3 consecutive sets of 15 repetitions.* Perform this exercise __________ times per day.  *When you easily complete 3 sets of 15, your physician, physical therapist or athletic trainer may advise you to add resistance by wearing a backpack filled with additional weight. STRENGTH - Plantar Flexors, Seated   Sit on a chair that allows your feet to rest flat on the ground. If necessary, sit at the edge of the chair.  Keeping your toes firmly on the ground, lift your right / left heel as far as you can without increasing any discomfort in your ankle. Repeat __________ times. Complete this exercise __________ times a day. *If instructed by your physician, physical therapist or athletic trainer, you may add ____________________ of resistance by placing a weighted object on your right / left knee. Document Released: 06/22/2005 Document Revised: 02/13/2012  Document Reviewed: 03/05/2009 [employer] Patient Information 2015 [employer]. This information is not intended to replace advice given to you by your health care provider. Make sure you discuss any questions you have with your health care provider.  

## 2015-05-31 NOTE — Progress Notes (Signed)
Subjective:     Patient ID: Jonathon Middleton, male   DOB: 1951-04-21, 64 y.o.   MRN: [id number]  HPI 64 year old male presents the office today with complaints of right foot pain. He states that over the weekend he felt a pop in the arch of his foot and he said pain to his heel. He points to the back of his heel over the majority of his pain is at. He has no pain with regular activity however he does have pain in the mornings which is relieved by ambulation as well as after being on his feet for periods of time. He denies any swelling or any redness overlying the area. He is able to ambulate without any change in gait. Denies any numbness or tingling. No other complaints at this time.  Review of Systems  All other systems reviewed and are negative.      Objective:   Physical Exam AAO 3, NAD DP/PT pulses palpable, CRT less than 3 seconds Protective sensation intact with Simms Weinstein monofilament, vibratory sensation intact, Achilles tendon reflex intact. There is tenderness palpation along the inferior aspect of the Achilles tendon and on the insertion into the calcaneus. There is no pain along the midsubstance of the Achilles tendon. Thompson test was performed and it was negative. There is no defect noted within the Achilles tendon. There is no discomfort on the plantar aspect of the calcaneus on the course of the plantar fascia on the insertion into the calcaneus. The plantar fascia appears to be intact. There is no overlying edema, erythema, ecchymosis. MMT 5/5, ROM WNL No open lesions or pre-ulcer lesions identified bilaterally. There is no pain with calf compression, swelling, warmth, erythema.    Assessment:     64 year old male with Achilles tendinitis right side    Plan:     -X-rays were obtained and reviewed with the patient.  -Treatment options discussed including all alternatives, risks, and complications -Discussed likely etiology of the symptoms. -Dispensed night  splint. -Stretching and icing activities discussed. Continue these on a regular basis.  -Discussed orthotics. He will start with OTC orthotics.  -Follow-up 3 weeks or sooner if any problems arise. In the meantime, encouraged to call the office with any questions, concerns, change in symptoms.   Ovid Curd, DPM

## 2015-06-16 ENCOUNTER — Ambulatory Visit: Payer: BLUE CROSS/BLUE SHIELD | Admitting: Podiatry

## 2016-05-18 ENCOUNTER — Other Ambulatory Visit: Payer: Self-pay | Admitting: *Deleted

## 2016-05-18 MED ORDER — METOPROLOL SUCCINATE ER 50 MG PO TB24: 50.0000 mg | ORAL_TABLET | Freq: Every day | ORAL | Status: DC

## 2016-05-24 ENCOUNTER — Other Ambulatory Visit: Payer: Self-pay | Admitting: *Deleted

## 2016-05-24 MED ORDER — GLUCOSE BLOOD VI STRP: ORAL_STRIP | Status: DC

## 2016-05-31 ENCOUNTER — Ambulatory Visit (INDEPENDENT_AMBULATORY_CARE_PROVIDER_SITE_OTHER): Payer: Commercial Managed Care - PPO | Admitting: Family Medicine

## 2016-05-31 ENCOUNTER — Encounter: Payer: Self-pay | Admitting: Family Medicine

## 2016-05-31 VITALS — BP 144/88 | HR 74 | Temp 98.0°F | Ht 70.0 in | Wt 221.5 lb

## 2016-05-31 DIAGNOSIS — I10 Essential (primary) hypertension: Secondary | ICD-10-CM

## 2016-05-31 DIAGNOSIS — K760 Fatty (change of) liver, not elsewhere classified: Secondary | ICD-10-CM

## 2016-05-31 DIAGNOSIS — F339 Major depressive disorder, recurrent, unspecified: Secondary | ICD-10-CM

## 2016-05-31 DIAGNOSIS — E785 Hyperlipidemia, unspecified: Secondary | ICD-10-CM | POA: Diagnosis not present

## 2016-05-31 DIAGNOSIS — I480 Paroxysmal atrial fibrillation: Secondary | ICD-10-CM

## 2016-05-31 DIAGNOSIS — R7303 Prediabetes: Secondary | ICD-10-CM

## 2016-05-31 DIAGNOSIS — Z0000 Encounter for general adult medical examination without abnormal findings: Secondary | ICD-10-CM

## 2016-05-31 DIAGNOSIS — E669 Obesity, unspecified: Secondary | ICD-10-CM

## 2016-05-31 DIAGNOSIS — Z125 Encounter for screening for malignant neoplasm of prostate: Secondary | ICD-10-CM

## 2016-05-31 NOTE — Progress Notes (Signed)
Pre visit review using our clinic review tool, if applicable. No additional management support is needed unless otherwise documented below in the visit note. 

## 2016-05-31 NOTE — Assessment & Plan Note (Signed)
Preventative protocols reviewed and updated unless pt declined. Discussed healthy diet and lifestyle.  

## 2016-05-31 NOTE — Assessment & Plan Note (Signed)
Check FLP when returns fasting. 

## 2016-05-31 NOTE — Assessment & Plan Note (Signed)
Recent episode endorsed but today sounds regular.  Has f/u with cards next month.  Continue aspirin, beta blocker.

## 2016-05-31 NOTE — Assessment & Plan Note (Signed)
Chronic. Mildly elevated today. Consider diuretic if persistently elevated. Pt motivated to restart aerobic exercise for weight loss which should help control blood pressure better.

## 2016-05-31 NOTE — Progress Notes (Signed)
BP 144/88 mmHg  Pulse 74  Temp(Src) 98 F (36.7 C) (Oral)  Ht  (1.778 m)  Wt 221 lb 8 oz (100.472 kg)  BMI 31.78 kg/m2  SpO2 98%   CC: CPE  Subjective:    Patient ID: Jonathon Middleton, male    DOB: 1951-03-13, 65 y.o.   MRN: [id number]  HPI: Jonathon Middleton is a 65 y.o. male presenting on 05/31/2016 for Annual Exam   Parox afib followed by Dr Mariah Milling. Recently acting up (as late as last week). Felt more fatigued. Has f/u with cardiology next month. On PRN Dilt, but rare use (ran out). Does take aspirin  BID and toprol XL  daily.   Depression - zoloft working well. Has been on prozac, venlafaxine, pristiq. Paxil caused weight gain. Ongoing trouble sleeping. Trouble maintaining sleep - lots of distractions (ie dogs barking, wife arriving home late from job).   Preventative: Colonoscopy - 2008 ([place]) WNL, rpt 10 yrs.  Prostate cancer screening - desires to continue screening regularly. Flu not done Pneumovax 10/2013 Tdap - 04/2014 zostavax 10/2013 Seat belt use discussed Sunscreen use discussed, no changing moles on skin.  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 Interim medical history since our last visit reviewed. Allergies and medications reviewed and updated. Current Outpatient Prescriptions on File Prior to Visit  Medication Sig  . aspirin 162 MG EC tablet Take 81 mg by mouth 2 (two) times daily.   Marland Kitchen atorvastatin (LIPITOR) 40 MG tablet Take 1 tablet (40 mg total) by mouth at bedtime.  . Cholecalciferol (VITAMIN D-3) 1000 UNITS CAPS Take 2,000 capsules by mouth 2 (two) times daily.  . Coenzyme Q10 (CO Q-10) 100 MG CAPS Take 100 mg by mouth daily.    Marland Kitchen glucose blood test strip Check sugars once daily and as needed Dx:R73.03 **One Touch Ultra Blue**  . Lancets (ONETOUCH ULTRASOFT) lancets USE AS INSTRUCTED  . lansoprazole (PREVACID) 15 MG capsule Take 15 mg by mouth daily.   . Melatonin 5 MG TABS Take 10 mg by mouth at bedtime.   . metoprolol succinate (TOPROL-XL) 50 MG 24 hr tablet Take 1 tablet (50 mg total) by mouth daily.  . Multiple Vitamin (MULTIVITAMIN) tablet Take 1 tablet by mouth daily.  Marland Kitchen omega-3 fish oil (MAXEPA) 1000 MG CAPS capsule Take 2 capsules by mouth 2 (two) times daily.  . sertraline (ZOLOFT) 50 MG tablet Take 1 tablet (50 mg total) by mouth daily.   No current facility-administered medications on file prior to visit.    Review of Systems  Constitutional: Negative for fever, chills, activity change, appetite change, fatigue and unexpected weight change.  HENT: Negative for hearing loss.   Eyes: Negative for visual disturbance.  Respiratory: Negative for cough, chest tightness, shortness of breath and wheezing.   Cardiovascular: Positive for leg swelling (dependent). Negative for chest pain and palpitations.  Gastrointestinal: Negative for nausea, vomiting, abdominal pain, diarrhea, constipation, blood in stool and abdominal distention.  Genitourinary: Negative for hematuria and difficulty urinating.  Musculoskeletal: Negative for myalgias, arthralgias and neck pain.  Skin: Negative for rash.  Neurological: Negative for dizziness, seizures, syncope and headaches.  Hematological: Negative for adenopathy. Does not bruise/bleed easily.  Psychiatric/Behavioral: Positive for dysphoric mood.  The patient is nervous/anxious.    Per HPI unless specifically indicated in ROS section     Objective:    BP 144/88 mmHg  Pulse 74  Temp(Src) 98 F (36.7 C) (Oral)  Ht 5\' 10"  (1.778 m)  Wt 221 lb 8 oz (100.472 kg)  BMI 31.78 kg/m2  SpO2 98%  Wt Readings from Last 3 Encounters:  05/31/16 221 lb 8 oz (100.472 kg)   05/26/15 216 lb (97.977 kg)  04/27/15 215 lb 12 oz (97.864 kg)    Physical Exam  Constitutional: He is oriented to person, place, and time. He appears well-developed and well-nourished. No distress.  HENT:  Head: Normocephalic and atraumatic.  Right Ear: Hearing, tympanic membrane, external ear and ear canal normal.  Left Ear: Hearing, tympanic membrane, external ear and ear canal normal.  Nose: Nose normal.  Mouth/Throat: Uvula is midline, oropharynx is clear and moist and mucous membranes are normal. No oropharyngeal exudate, posterior oropharyngeal edema or posterior oropharyngeal erythema.  Eyes: Conjunctivae and EOM are normal. Pupils are equal, round, and reactive to light. No scleral icterus.  Neck: Normal range of motion. Neck supple. Carotid bruit is not present. No thyromegaly present.  Cardiovascular: Normal rate, regular rhythm, normal heart sounds and intact distal pulses.   No murmur heard. Pulses:      Radial pulses are 2+ on the right side, and 2+ on the left side.  Pulmonary/Chest: Effort normal and breath sounds normal. No respiratory distress. He has no wheezes. He has no rales.  Abdominal: Soft. Bowel sounds are normal. He exhibits no distension and no mass. There is no tenderness. There is no rebound and no guarding.  Genitourinary: Rectum normal and prostate normal. Rectal exam shows no external hemorrhoid, no internal hemorrhoid, no fissure, no mass, no tenderness and anal tone normal. Prostate is not enlarged and not tender.  Musculoskeletal: Normal range of motion. He exhibits edema (mild nonpitting).  Lymphadenopathy:    He has no cervical adenopathy.  Neurological: He is alert and oriented to person, place, and time.  CN grossly intact, station and gait intact  Skin: Skin is warm and dry. No rash noted.  Psychiatric: He has a normal mood and affect. His behavior is normal. Judgment and thought content normal.  Nursing note and vitals reviewed.     Assessment  & Plan:   Problem List Items Addressed This Visit    Paroxysmal atrial fibrillation (HCC)    Recent episode endorsed but today sounds regular.  Has f/u with cards next month.  Continue aspirin, beta blocker.       Hyperlipidemia    Check FLP when returns fasting.      Relevant Orders   Lipid panel   Comprehensive metabolic panel   Essential hypertension    Chronic. Mildly elevated today. Consider diuretic if persistently elevated. Pt motivated to restart aerobic exercise for weight loss which should help control blood pressure better.      Relevant Orders   Microalbumin / creatinine urine ratio   MDD (major depressive disorder), recurrent episode (HCC)    Chronic, stable. Feels doing well on sertraline 50mg  daily - desires to continue at this dose.       Prediabetes    Check A1c at f/u visit. If trending up, low threshold to start metformin.      Relevant Orders   Hemoglobin A1c   Nonalcoholic fatty liver disease    Check LFTs again - h/o transaminitis.       Relevant Orders  Comprehensive metabolic panel   Health maintenance examination - Primary    Preventative protocols reviewed and updated unless pt declined. Discussed healthy diet and lifestyle.       Obesity, Class I, BMI 30-34.9    Discussed healthy diet and lifestyle changes to affect sustainable weight loss.       Other Visit Diagnoses    Special screening for malignant neoplasm of prostate        Relevant Orders    PSA        Follow up plan: Return in about 1 year (around 05/31/2017), or as needed, for annual exam, prior fasting for blood work.  Eustaquio BoydenJavier Blonnie Maske, MD

## 2016-05-31 NOTE — Assessment & Plan Note (Signed)
Check LFTs again - h/o transaminitis.

## 2016-05-31 NOTE — Assessment & Plan Note (Signed)
Discussed healthy diet and lifestyle changes to affect sustainable weight loss  

## 2016-05-31 NOTE — Patient Instructions (Addendum)
Work on incorporating exercise into routine - more walking regularly.  You are doing well today Return at your convenience this week for fasitng labs. Return as needed or in 1 month for follow up visit.   Health Maintenance, Male A healthy lifestyle and preventative care can promote health and wellness.  Maintain regular health, dental, and eye exams.  Eat a healthy diet. Foods like vegetables, fruits, whole grains, low-fat dairy products, and lean protein foods contain the nutrients you need and are low in calories. Decrease your intake of foods high in solid fats, added sugars, and salt. Get information about a proper diet from your health care provider, if necessary.  Regular physical exercise is one of the most important things you can do for your health. Most adults should get at least 150 minutes of moderate-intensity exercise (any activity that increases your heart rate and causes you to sweat) each week. In addition, most adults need muscle-strengthening exercises on 2 or more days a week.   Maintain a healthy weight. The body mass index (BMI) is a screening tool to identify possible weight problems. It provides an estimate of body fat based on height and weight. Your health care provider can find your BMI and can help you achieve or maintain a healthy weight. For males 20 years and older:  A BMI below 18.5 is considered underweight.  A BMI of 18.5 to 24.9 is normal.  A BMI of 25 to 29.9 is considered overweight.  A BMI of 30 and above is considered obese.  Maintain normal blood lipids and cholesterol by exercising and minimizing your intake of saturated fat. Eat a balanced diet with plenty of fruits and vegetables. Blood tests for lipids and cholesterol should begin at age 64 and be repeated every 5 years. If your lipid or cholesterol levels are high, you are over age 64, or you are at high risk for heart disease, you may need your cholesterol levels checked more frequently.Ongoing  high lipid and cholesterol levels should be treated with medicines if diet and exercise are not working.  If you smoke, find out from your health care provider how to quit. If you do not use tobacco, do not start.  Lung cancer screening is recommended for adults aged 55-80 years who are at high risk for developing lung cancer because of a history of smoking. A yearly low-dose CT scan of the lungs is recommended for people who have at least a 30-pack-year history of smoking and are current smokers or have quit within the past 15 years. A pack year of smoking is smoking an average of 1 pack of cigarettes a day for 1 year (for example, a 30-pack-year history of smoking could mean smoking 1 pack a day for 30 years or 2 packs a day for 15 years). Yearly screening should continue until the smoker has stopped smoking for at least 15 years. Yearly screening should be stopped for people who develop a health problem that would prevent them from having lung cancer treatment.  If you choose to drink alcohol, do not have more than 2 drinks per day. One drink is considered to be 12 oz (360 mL) of beer, 5 oz (150 mL) of wine, or 1.5 oz (45 mL) of liquor.  Avoid the use of street drugs. Do not share needles with anyone. Ask for help if you need support or instructions about stopping the use of drugs.  High blood pressure causes heart disease and increases the risk of stroke. High  blood pressure is more likely to develop in:  People who have blood pressure in the end of the normal range (100-139/85-89 mm Hg).  People who are overweight or obese.  People who are African American.  If you are 68-17 years of age, have your blood pressure checked every 3-5 years. If you are 1 years of age or older, have your blood pressure checked every year. You should have your blood pressure measured twice--once when you are at a hospital or clinic, and once when you are not at a hospital or clinic. Record the average of the two  measurements. To check your blood pressure when you are not at a hospital or clinic, you can use:  An automated blood pressure machine at a pharmacy.  A home blood pressure monitor.  If you are 8-29 years old, ask your health care provider if you should take aspirin to prevent heart disease.  Diabetes screening involves taking a blood sample to check your fasting blood sugar level. This should be done once every 3 years after age 47 if you are at a normal weight and without risk factors for diabetes. Testing should be considered at a younger age or be carried out more frequently if you are overweight and have at least 1 risk factor for diabetes.  Colorectal cancer can be detected and often prevented. Most routine colorectal cancer screening begins at the age of 10 and continues through age 38. However, your health care provider may recommend screening at an earlier age if you have risk factors for colon cancer. On a yearly basis, your health care provider may provide home test kits to check for hidden blood in the stool. A small camera at the end of a tube may be used to directly examine the colon (sigmoidoscopy or colonoscopy) to detect the earliest forms of colorectal cancer. Talk to your health care provider about this at age 18 when routine screening begins. A direct exam of the colon should be repeated every 5-10 years through age 22, unless early forms of precancerous polyps or small growths are found.  People who are at an increased risk for hepatitis B should be screened for this virus. You are considered at high risk for hepatitis B if:  You were born in a country where hepatitis B occurs often. Talk with your health care provider about which countries are considered high risk.  Your parents were born in a high-risk country and you have not received a shot to protect against hepatitis B (hepatitis B vaccine).  You have HIV or AIDS.  You use needles to inject street drugs.  You live  with, or have sex with, someone who has hepatitis B.  You are a man who has sex with other men (MSM).  You get hemodialysis treatment.  You take certain medicines for conditions like cancer, organ transplantation, and autoimmune conditions.  Hepatitis C blood testing is recommended for all people born from [birth year] through 1965 and any individual with known risk factors for hepatitis C.  Healthy men should no longer receive prostate-specific antigen (PSA) blood tests as part of routine cancer screening. Talk to your health care provider about prostate cancer screening.  Testicular cancer screening is not recommended for adolescents or adult males who have no symptoms. Screening includes self-exam, a health care provider exam, and other screening tests. Consult with your health care provider about any symptoms you have or any concerns you have about testicular cancer.  Practice safe sex. Use condoms and  avoid high-risk sexual practices to reduce the spread of sexually transmitted infections (STIs).  You should be screened for STIs, including gonorrhea and chlamydia if:  You are sexually active and are younger than 24 years.  You are older than 24 years, and your health care provider tells you that you are at risk for this type of infection.  Your sexual activity has changed since you were last screened, and you are at an increased risk for chlamydia or gonorrhea. Ask your health care provider if you are at risk.  If you are at risk of being infected with HIV, it is recommended that you take a prescription medicine daily to prevent HIV infection. This is called pre-exposure prophylaxis (PrEP). You are considered at risk if:  You are a man who has sex with other men (MSM).  You are a heterosexual man who is sexually active with multiple partners.  You take drugs by injection.  You are sexually active with a partner who has HIV.  Talk with your health care provider about whether you are at  high risk of being infected with HIV. If you choose to begin PrEP, you should first be tested for HIV. You should then be tested every 3 months for as long as you are taking PrEP.  Use sunscreen. Apply sunscreen liberally and repeatedly throughout the day. You should seek shade when your shadow is shorter than you. Protect yourself by wearing long sleeves, pants, a wide-brimmed hat, and sunglasses year round whenever you are outdoors.  Tell your health care provider of new moles or changes in moles, especially if there is a change in shape or color. Also, tell your health care provider if a mole is larger than the size of a pencil eraser.  A one-time screening for abdominal aortic aneurysm (AAA) and surgical repair of large AAAs by ultrasound is recommended for men aged 20-75 years who are current or former smokers.  Stay current with your vaccines (immunizations).   This information is not intended to replace advice given to you by your health care provider. Make sure you discuss any questions you have with your health care provider.   Document Released: 05/19/2008 Document Revised: 12/12/2014 Document Reviewed: 04/18/2011 Elsevier Interactive Patient Education [employer].

## 2016-05-31 NOTE — Assessment & Plan Note (Signed)
Chronic, stable. Feels doing well on sertraline 50mg  daily - desires to continue at this dose.

## 2016-05-31 NOTE — Assessment & Plan Note (Signed)
Check A1c at f/u visit. If trending up, low threshold to start metformin.

## 2016-06-01 ENCOUNTER — Other Ambulatory Visit (INDEPENDENT_AMBULATORY_CARE_PROVIDER_SITE_OTHER): Payer: Commercial Managed Care - PPO

## 2016-06-01 DIAGNOSIS — Z125 Encounter for screening for malignant neoplasm of prostate: Secondary | ICD-10-CM

## 2016-06-01 DIAGNOSIS — K760 Fatty (change of) liver, not elsewhere classified: Secondary | ICD-10-CM

## 2016-06-01 DIAGNOSIS — E785 Hyperlipidemia, unspecified: Secondary | ICD-10-CM

## 2016-06-01 DIAGNOSIS — I10 Essential (primary) hypertension: Secondary | ICD-10-CM | POA: Diagnosis not present

## 2016-06-01 DIAGNOSIS — R7303 Prediabetes: Secondary | ICD-10-CM

## 2016-06-01 LAB — LIPID PANEL
CHOLESTEROL: 120 mg/dL (ref 0–200)
HDL: 39.2 mg/dL (ref >39.00)
LDL CALC: 57 mg/dL (ref 0–99)
NonHDL: 80.79
Total CHOL/HDL Ratio: 3
Triglycerides: 121 mg/dL (ref 0.0–149.0)
VLDL: 24.2 mg/dL (ref 0.0–40.0)

## 2016-06-01 LAB — COMPREHENSIVE METABOLIC PANEL
ALK PHOS: 108 U/L (ref 39–117)
ALT: 18 U/L (ref 0–53)
AST: 17 U/L (ref 0–37)
Albumin: 4.3 g/dL (ref 3.5–5.2)
BILIRUBIN TOTAL: 0.6 mg/dL (ref 0.2–1.2)
BUN: 20 mg/dL (ref 6–23)
CALCIUM: 9.1 mg/dL (ref 8.4–10.5)
CO2: 31 meq/L (ref 19–32)
Chloride: 104 mEq/L (ref 96–112)
Creatinine, Ser: 1.09 mg/dL (ref 0.40–1.50)
GFR: 72.47 mL/min (ref >60.00)
GLUCOSE: 120 mg/dL — AB (ref 70–99)
POTASSIUM: 4.3 meq/L (ref 3.5–5.1)
Sodium: 141 mEq/L (ref 135–145)
TOTAL PROTEIN: 6.7 g/dL (ref 6.0–8.3)

## 2016-06-01 LAB — HEMOGLOBIN A1C: Hgb A1c MFr Bld: 6.6 % — ABNORMAL HIGH (ref 4.6–6.5)

## 2016-06-01 LAB — MICROALBUMIN / CREATININE URINE RATIO
CREATININE, U: 155.4 mg/dL
Microalb Creat Ratio: 0.5 mg/g (ref 0.0–30.0)
Microalb, Ur: 0.7 mg/dL (ref 0.0–1.9)

## 2016-06-01 LAB — PSA: PSA: 4.13 ng/mL — AB (ref 0.10–4.00)

## 2016-06-03 ENCOUNTER — Other Ambulatory Visit: Payer: Self-pay | Admitting: *Deleted

## 2016-06-03 ENCOUNTER — Other Ambulatory Visit: Payer: Self-pay | Admitting: Family Medicine

## 2016-06-03 MED ORDER — SERTRALINE HCL 50 MG PO TABS: 50.0000 mg | ORAL_TABLET | Freq: Every day | ORAL | Status: DC

## 2016-06-03 MED ORDER — ATORVASTATIN CALCIUM 40 MG PO TABS: 40.0000 mg | ORAL_TABLET | Freq: Every day | ORAL | Status: DC

## 2016-06-27 ENCOUNTER — Ambulatory Visit (INDEPENDENT_AMBULATORY_CARE_PROVIDER_SITE_OTHER): Payer: Commercial Managed Care - PPO | Admitting: Family Medicine

## 2016-06-27 ENCOUNTER — Encounter: Payer: Self-pay | Admitting: Family Medicine

## 2016-06-27 VITALS — BP 140/100 | HR 64 | Temp 97.5°F | Wt 218.5 lb

## 2016-06-27 DIAGNOSIS — I10 Essential (primary) hypertension: Secondary | ICD-10-CM | POA: Diagnosis not present

## 2016-06-27 DIAGNOSIS — E119 Type 2 diabetes mellitus without complications: Secondary | ICD-10-CM | POA: Diagnosis not present

## 2016-06-27 DIAGNOSIS — R972 Elevated prostate specific antigen [PSA]: Secondary | ICD-10-CM

## 2016-06-27 DIAGNOSIS — I480 Paroxysmal atrial fibrillation: Secondary | ICD-10-CM | POA: Diagnosis not present

## 2016-06-27 DIAGNOSIS — F4321 Adjustment disorder with depressed mood: Secondary | ICD-10-CM | POA: Insufficient documentation

## 2016-06-27 MED ORDER — METFORMIN HCL 500 MG PO TABS: 500.0000 mg | ORAL_TABLET | Freq: Every day | ORAL | 3 refills | Status: DC

## 2016-06-27 MED ORDER — APIXABAN 5 MG PO TABS: 5.0000 mg | ORAL_TABLET | Freq: Two times a day (BID) | ORAL | 1 refills | Status: DC

## 2016-06-27 NOTE — Assessment & Plan Note (Signed)
DRE reassuring last visit, however PSA has more than doubled over the last 2 years. Rpt PSA with free/total readings today, if consistently elevated, will refer to urology in [place]. Pt agrees with plan.
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
## 2016-06-27 NOTE — Assessment & Plan Note (Signed)
Chronic, mildly elevated. Continue Toprol XL. Consider addition of ACEI - he will start monitoring and notify me if persistently elevated.

## 2016-06-27 NOTE — Patient Instructions (Addendum)
Start metformin 500mg  daily with breakfast.  Look into adding cinnamon into your regimen.  Monitor blood pressures - if consistently high, let me know to start second blood pressure medicine.  Increase Toprol XL to 75mg  daily. Labs today.  Congratulations on healthy diet and lifestyle changes.  Call Dr Laymond Purser for appointment (# provided today). Enjoy your upcoming trips.  Return in 5-6 months for lab visit only to check A1c.  Stop aspirin, start eliquis, follow up with Dr Mariah Milling next week.

## 2016-06-27 NOTE — Progress Notes (Signed)
Pre visit review using our clinic review tool, if applicable. No additional management support is needed unless otherwise documented below in the visit note. 

## 2016-06-27 NOTE — Assessment & Plan Note (Addendum)
A1c >6.5% - start metformin 500mg  daily. Pt implementing healthy diet and lifestyle changes.

## 2016-06-27 NOTE — Assessment & Plan Note (Signed)
Reviewed recent marital stressors.  Interested in counseling, declines change in sertraline dose.  Will refer to Dr Laymond Purser or Salomon Fick - # provided today.

## 2016-06-27 NOTE — Assessment & Plan Note (Addendum)
Sounds in afib today - check EKG and if afib, change aspirin 162mg  to eliquis 5mg  bid. Has f/u with cardiology on Monday. High stress family situation recently. EKG - atrial fibrillation 111 Suggested increase Toprol XL to 1.5 tablets (75mg ) daily until seen by Dr Mariah Milling.

## 2016-06-28 LAB — PSA TOTAL (REFLEX TO FREE): PROSTATE SPECIFIC AG, SERUM: 2.3 ng/mL (ref 0.0–4.0)

## 2016-07-04 ENCOUNTER — Encounter: Payer: Self-pay | Admitting: Cardiovascular Disease

## 2016-07-04 ENCOUNTER — Ambulatory Visit (INDEPENDENT_AMBULATORY_CARE_PROVIDER_SITE_OTHER): Payer: Commercial Managed Care - PPO | Admitting: Cardiovascular Disease

## 2016-07-04 VITALS — BP 150/98 | HR 128 | Ht 70.0 in | Wt 218.0 lb

## 2016-07-04 DIAGNOSIS — E119 Type 2 diabetes mellitus without complications: Secondary | ICD-10-CM

## 2016-07-04 DIAGNOSIS — E785 Hyperlipidemia, unspecified: Secondary | ICD-10-CM

## 2016-07-04 DIAGNOSIS — I480 Paroxysmal atrial fibrillation: Secondary | ICD-10-CM

## 2016-07-04 DIAGNOSIS — Z87891 Personal history of nicotine dependence: Secondary | ICD-10-CM

## 2016-07-04 DIAGNOSIS — Z720 Tobacco use: Secondary | ICD-10-CM

## 2016-07-04 DIAGNOSIS — I10 Essential (primary) hypertension: Secondary | ICD-10-CM

## 2016-07-04 MED ORDER — DILTIAZEM HCL ER COATED BEADS 120 MG PO CP24: 120.0000 mg | ORAL_CAPSULE | Freq: Every day | ORAL | 3 refills | Status: DC

## 2016-07-04 NOTE — Patient Instructions (Addendum)
Medication Instructions:   Please start diltiazem 120 mg once a day  Decrease the metoprolol down to 50 mg to start Ok to increase back to 75 mg if needed   Labwork:  No new labs  Testing/Procedures:  No new testing   Follow-Up: It was a pleasure seeing you in the office today. Please call us if you have new issues that need to be addressed before your next appt.  [phone]  Your physician wants you to follow-up in:  4 weeks   If you need a refill on your cardiac medications before your next appointment, please call your pharmacy.

## 2016-07-04 NOTE — Progress Notes (Signed)
Cardiology Office Note  Date:  07/04/2016   ID:  Jonathon Middleton, DOB [birth year], MRN [id number]  PCP:  Eustaquio Boyden, MD   Chief Complaint  Patient presents with  . Other    12 month follow up. Meds reviewed by the patient verbally. Pt. c/o a-fib with shortness of breath.     HPI:  Jonathon Middleton is a very pleasant 65 year old gentleman with prior smoking history for 5 years who stopped in 1984, prior history of atrial fibrillation in January 2014 converted to normal sinus rhythm with medical management who presents for follow-up of his atrial fibrillation.  In atrial fib today, duration uncertain No symptoms Such as shortness of breath or chest tightness  Started On eliquis 5 BID, x 1 week provided by primary care Metoprolol increased , 50 mg to 75 mg for rate control last week Recently Worked in the garden, denies having any symptoms Monitoring heart rate at home using his wrist blood pressure cuff, heart rate typically more than 100  He is leaving today, has a flight to [place] will return on Thursday, 4 days from now He denies any sleep apnea symptoms Trying to lose weight by exercise, watching his diet  Lab work reviewed with him showing: HBA1C 6.6 Total chol 120, ldl 57  Chads vascular score of 1.   EKG on today's visit showing atrial fibrillation with ventricular rate 128 bpm, no significant ST or T-wave changes  Other past medical history Seen by his primary care physician, Dr. Sharen Hones, on 09/19/2013, noted to be in atrial fibrillation with rate in the 140 range. He was relatively asymptomatic. rate improved back down to normal on October 18.    he had asymptomatic atrial fibrillation in January 2014. He did have some mild malaise, bloating in his chest. He was found on routine physical exam by Dr. Alison Murray to have a heart rate of 140 beats per minute. He was referred to [facility] cardiology, started on metoprolol. He had echocardiogram at that time which was  essentially normal. Also had stress test with normal treadmill   father had heart attack at age 6 and died. He was a long-time smoker. Mother lived to 52  PMH:   has a past medical history of Atrial fibrillation (HCC); Depression with anxiety; GERD (gastroesophageal reflux disease); History of chicken pox; Hyperlipidemia; Hypertension; Nonalcoholic fatty liver disease; and Prediabetes.  PSH:    Past Surgical History:  Procedure Laterality Date  . KNEE SURGERY Right 2009   x 2 (Dr. Ernest Pine)    Current Outpatient Prescriptions  Medication Sig Dispense Refill  . apixaban (ELIQUIS) 5 MG TABS tablet Take 1 tablet (5 mg total) by mouth 2 (two) times daily. 60 tablet 1  . atorvastatin (LIPITOR) 40 MG tablet Take 1 tablet (40 mg total) by mouth at bedtime. 90 tablet 3  . Cholecalciferol (VITAMIN D-3) 1000 UNITS CAPS Take 2,000 capsules by mouth 2 (two) times daily.    Marland Kitchen CINNAMON PO Take by mouth.    . Coenzyme Q10 (CO Q-10) 100 MG CAPS Take 100 mg by mouth daily.    Marland Kitchen glucose blood test strip Check sugars once daily and as needed Dx:R73.03 **One Touch Ultra Blue** 100 each 3  . Lancets (ONETOUCH ULTRASOFT) lancets USE AS INSTRUCTED 100 each 0  . lansoprazole (PREVACID) 15 MG capsule Take 15 mg by mouth daily.     . Melatonin 5 MG TABS Take 10 mg by mouth at bedtime.     . metFORMIN (GLUCOPHAGE) 500 MG tablet  Take 1 tablet (500 mg total) by mouth daily with breakfast. 90 tablet 3  . metoprolol succinate (TOPROL-XL) 50 MG 24 hr tablet Take 1 tablet (50 mg total) by mouth daily. (Patient taking differently: Take 75 mg by mouth daily. ) 90 tablet 0  . Multiple Vitamin (MULTIVITAMIN) tablet Take 1 tablet by mouth daily.    Marland Kitchen omega-3 fish oil (MAXEPA) 1000 MG CAPS capsule Take 2 capsules by mouth 2 (two) times daily.    . sertraline (ZOLOFT) 50 MG tablet Take 1 tablet (50 mg total) by mouth daily. 90 tablet 3  . diltiazem (CARDIZEM CD) 120 MG 24 hr capsule Take 1 capsule (120 mg total) by mouth  daily. 90 capsule 3   No current facility-administered medications for this visit.      Allergies:   Review of patient's allergies indicates no known allergies.   Social History:  The patient  reports that he quit smoking about 33 years ago. His smoking use included Cigars. He has a 5.00 pack-year smoking history. He has never used smokeless tobacco. He reports that he drinks about 1.2 oz of alcohol per week . He reports that he does not use drugs.   Family History:   family history includes CAD (age of onset: 25) in his father; CAD (age of onset: 7) in his paternal uncle; Cancer in his maternal uncle; Diabetes in his paternal grandmother; Hypertension in his mother; Stroke in his paternal aunt.    Review of Systems: Review of Systems  Constitutional: Negative.   Respiratory: Negative.   Cardiovascular: Positive for palpitations.  Gastrointestinal: Negative.   Musculoskeletal: Negative.   Neurological: Negative.   Psychiatric/Behavioral: Negative.   All other systems reviewed and are negative.    PHYSICAL EXAM: VS:  BP (!) 150/98 (BP Location: Left Arm, Patient Position: Sitting, Cuff Size: Normal)   Pulse (!) 128   Ht 5\' 10"  (1.778 m)   Wt 218 lb (98.9 kg)   BMI 31.28 kg/m  , BMI Body mass index is 31.28 kg/m. GEN: Well nourished, well developed, in no acute distress  HEENT: normal  Neck: no JVD, carotid bruits, or masses Cardiac: RRR; no murmurs, rubs, or gallops,no edema  Respiratory:  clear to auscultation bilaterally, normal work of breathing GI: soft, nontender, nondistended, + BS MS: no deformity or atrophy  Skin: warm and dry, no rash Neuro:  Strength and sensation are intact Psych: euthymic mood, full affect    Recent Labs: 06/01/2016: ALT 18; BUN 20; Creatinine, Ser 1.09; Potassium 4.3; Sodium 141    Lipid Panel Lab Results  Component Value Date   CHOL 120 06/01/2016   HDL 39.20 06/01/2016   LDLCALC 57 06/01/2016   TRIG 121.0 06/01/2016      Wt  Readings from Last 3 Encounters:  07/04/16 218 lb (98.9 kg)  06/27/16 218 lb 8 oz (99.1 kg)  05/31/16 221 lb 8 oz (100.5 kg)       ASSESSMENT AND PLAN:  Essential hypertension - Plan: EKG 12-Lead  Paroxysmal atrial fibrillation (HCC) - Plan: EKG 12-Lead In atrial fibrillation for at least one week, asymptomatic In an effort to restore normal sinus rhythm, encouraged him to continue eliquis 5 mg twice a day. We will start diltiazem ER 120 mg daily for rate control May need to decrease metoprolol back to 50 mg daily if blood pressure drops In 3-4 weeks time, if still in atrial fibrillation, could consider starting amiodarone for pharmacologic cardioversion. -He denies sleep apnea symptoms  Smoking history  We have encouraged him to continue to work on weaning his cigarettes and smoking cessation. He will continue to work on this and does not want any assistance with chantix.   Hyperlipidemia Cholesterol is at goal on the current lipid regimen. No changes to the medications were made.  Controlled type 2 diabetes mellitus without complication, without long-term current use of insulin (HCC) We have encouraged continued exercise, careful diet management in an effort to lose weight.    Total encounter time more than 25 minutes  Greater than 50% was spent in counseling and coordination of care with the patient   Disposition:   F/U  4 weeks    Orders Placed This Encounter  Procedures  . EKG 12-Lead     Signed, Dossie Arbour, M.D., Ph.D. 07/04/2016  [facility] [place], [place] [phone]

## 2016-07-15 ENCOUNTER — Other Ambulatory Visit: Payer: Self-pay | Admitting: Family Medicine

## 2016-07-29 ENCOUNTER — Ambulatory Visit (INDEPENDENT_AMBULATORY_CARE_PROVIDER_SITE_OTHER): Payer: Commercial Managed Care - PPO | Admitting: Cardiovascular Disease

## 2016-07-29 ENCOUNTER — Telehealth: Payer: Self-pay | Admitting: Cardiovascular Disease

## 2016-07-29 ENCOUNTER — Encounter: Payer: Self-pay | Admitting: Cardiovascular Disease

## 2016-07-29 VITALS — BP 124/92 | HR 121 | Ht 70.0 in | Wt 214.5 lb

## 2016-07-29 DIAGNOSIS — I480 Paroxysmal atrial fibrillation: Secondary | ICD-10-CM | POA: Diagnosis not present

## 2016-07-29 DIAGNOSIS — E785 Hyperlipidemia, unspecified: Secondary | ICD-10-CM | POA: Diagnosis not present

## 2016-07-29 DIAGNOSIS — Z720 Tobacco use: Secondary | ICD-10-CM

## 2016-07-29 DIAGNOSIS — I481 Persistent atrial fibrillation: Secondary | ICD-10-CM | POA: Diagnosis not present

## 2016-07-29 DIAGNOSIS — I10 Essential (primary) hypertension: Secondary | ICD-10-CM | POA: Diagnosis not present

## 2016-07-29 DIAGNOSIS — E119 Type 2 diabetes mellitus without complications: Secondary | ICD-10-CM

## 2016-07-29 DIAGNOSIS — I4819 Other persistent atrial fibrillation: Secondary | ICD-10-CM

## 2016-07-29 DIAGNOSIS — Z87891 Personal history of nicotine dependence: Secondary | ICD-10-CM

## 2016-07-29 MED ORDER — AMIODARONE HCL 200 MG PO TABS: 200.0000 mg | ORAL_TABLET | Freq: Two times a day (BID) | ORAL | 4 refills | Status: DC

## 2016-07-29 NOTE — Telephone Encounter (Signed)
Spoke w/ pt.  Advised him to follow instructions given to him by Dr. Mariah MillingGollan.  He is appreciative and will call back w/ any further questions.

## 2016-07-29 NOTE — Progress Notes (Signed)
Cardiology Office Note  Date:  07/29/2016   ID:  Jonathon Middleton, DOB May 19, 1951, MRN [id number]  PCP:  Jonathon BoydenJavier Gutierrez, MD   Chief Complaint  Patient presents with  . Other    1 month f/u c/o sob and edema. Meds reviewed verbally with pt.    HPI:  Jonathon Middleton is a very pleasant 65 year old gentleman with prior smoking history for 5 years who stopped in 1984, prior history of atrial fibrillation in January 2014 converted to normal sinus rhythm with medical management who presents for follow-up of his atrial fibrillation.  In follow-up, he reports that he is doing well,  Trying to watch his diet, losing weight, down 4.5 pounds in one month Using treadmill No sx in atrial fib, heart rate continues to run 90 up to 110 bpm  Feels he is Ready for cardioversion Has been compliant with anticoagulation He is taking metoprolol 50 mill grams twice a day with diltiazem 120 mg daily and eliquis 5 twice a day  EKG on today's visit shows atrial fibrillation with ventricular rate 121 bpm, no significant ST or T-wave changes  Other past medical history HBA1C 6.6 Total chol 120, ldl 57  Chads vascular score of 1.   Seen by his primary care physician, Dr. Sharen HonesGutierrez, on 09/19/2013, noted to be in atrial fibrillation with rate in the 140 range. He was relatively asymptomatic. rate improved back down to normal on October 18.   he had asymptomatic atrial fibrillation in January 2014. He did have some mild malaise, bloating in his chest. He was found on routine physical exam by Dr. Alison MurrayAndreassi to have a heart rate of 140 beats per minute. [facility], started on metoprolol. He had echocardiogram at that time which was essentially normal. Also had stress test with normal treadmill   father had heart attack at age 64 and died. He was a long-time smoker. Mother lived to 3686  PMH:   has a past medical history of Atrial fibrillation (HCC); Depression with anxiety; GERD  (gastroesophageal reflux disease); History of chicken pox; Hyperlipidemia; Hypertension; Nonalcoholic fatty liver disease; and Prediabetes.  PSH:    Past Surgical History:  Procedure Laterality Date  . KNEE SURGERY Right 2009   x 2 (Dr. Ernest PineHooten)    Current Outpatient Prescriptions  Medication Sig Dispense Refill  . apixaban (ELIQUIS) 5 MG TABS tablet Take 1 tablet (5 mg total) by mouth 2 (two) times daily. 60 tablet 1  . atorvastatin (LIPITOR) 40 MG tablet Take 1 tablet (40 mg total) by mouth at bedtime. 90 tablet 3  . Cholecalciferol (VITAMIN D) 2000 units CAPS Take by mouth 2 (two) times daily.    Marland Kitchen. CINNAMON PO Take 500 mg by mouth daily.     . Coenzyme Q10 (CO Q-10) 100 MG CAPS Take 100 mg by mouth daily.    Marland Kitchen. diltiazem (CARDIZEM CD) 120 MG 24 hr capsule Take 1 capsule (120 mg total) by mouth daily. 90 capsule 3  . glucose blood test strip Check sugars once daily and as needed Dx:R73.03 **One Touch Ultra Blue** 100 each 3  . Lancets (ONETOUCH ULTRASOFT) lancets USE AS INSTRUCTED 100 each 0  . lansoprazole (PREVACID) 15 MG capsule Take 15 mg by mouth daily.     . Melatonin 10 MG TABS Take by mouth daily.    . metFORMIN (GLUCOPHAGE) 500 MG tablet Take 1 tablet (500 mg total) by mouth daily with breakfast. 90 tablet 3  . metoprolol succinate (TOPROL-XL) 50 MG  24 hr tablet Take 1 tablet by mouth  daily 90 tablet 1  . Multiple Vitamin (MULTIVITAMIN) tablet Take 1 tablet by mouth daily.    Marland Kitchen omega-3 fish oil (MAXEPA) 1000 MG CAPS capsule Take 2 capsules by mouth 2 (two) times daily.    . sertraline (ZOLOFT) 50 MG tablet Take 1 tablet (50 mg total) by mouth daily. 90 tablet 3   No current facility-administered medications for this visit.      Allergies:   Review of patient's allergies indicates no known allergies.   Social History:  The patient  reports that he quit smoking about 33 years ago. His smoking use included Cigars. He has a 5.00 pack-year smoking history. He has never used  smokeless tobacco. He reports that he drinks about 1.2 oz of alcohol per week . He reports that he does not use drugs.   Family History:   family history includes CAD (age of onset: 52) in his father; CAD (age of onset: 51) in his paternal uncle; Cancer in his maternal uncle; Diabetes in his paternal grandmother; Hypertension in his mother; Stroke in his paternal aunt.    Review of Systems: Review of Systems  Constitutional: Negative.   Respiratory: Negative.   Cardiovascular: Negative.   Gastrointestinal: Negative.   Musculoskeletal: Negative.   Neurological: Negative.   Psychiatric/Behavioral: Negative.   All other systems reviewed and are negative.    PHYSICAL EXAM: VS:  BP (!) 124/92 (BP Location: Left Arm, Patient Position: Sitting, Cuff Size: Normal)   Pulse (!) 121   Ht 5\' 10"  (1.778 m)   Wt 214 lb 8 oz (97.3 kg)   BMI 30.78 kg/m  , BMI Body mass index is 30.78 kg/m. GEN: Well nourished, well developed, in no acute distress  HEENT: normal  Neck: no JVD, carotid bruits, or masses Cardiac: RRR; no murmurs, rubs, or gallops,no edema  Respiratory:  clear to auscultation bilaterally, normal work of breathing GI: soft, nontender, nondistended, + BS MS: no deformity or atrophy  Skin: warm and dry, no rash Neuro:  Strength and sensation are intact Psych: euthymic mood, full affect    Recent Labs: 06/01/2016: ALT 18; BUN 20; Creatinine, Ser 1.09; Potassium 4.3; Sodium 141    Lipid Panel Lab Results  Component Value Date   CHOL 120 06/01/2016   HDL 39.20 06/01/2016   LDLCALC 57 06/01/2016   TRIG 121.0 06/01/2016      Wt Readings from Last 3 Encounters:  07/29/16 214 lb 8 oz (97.3 kg)  07/04/16 218 lb (98.9 kg)  06/27/16 218 lb 8 oz (99.1 kg)       ASSESSMENT AND PLAN:  Persistent atrial fibrillation (HCC) - Plan: EKG 12-Lead Persistent atrial fibrillation We'll start amiodarone 400 mg twice a day for 5 days  then down to 200 mg twice a day EKG in 2  weeks' time If he does not restore normal sinus rhythm, we will schedule for cardioversion Suggested he try to increase metoprolol up to 75 mg twice a day  Hyperlipidemia Cholesterol is at goal on the current lipid regimen. No changes to the medications were made.  Essential hypertension Blood pressure is well controlled on today's visit. No changes made to the medications.  Smoking history We have encouraged him to continue to work on weaning his cigarettes and smoking cessation. He will continue to work on this and does not want any assistance with chantix.   Controlled type 2 diabetes mellitus without complication, without long-term current use of insulin (  HCC) We have encouraged continued exercise, careful diet management in an effort to lose weight.  weight down 5 pounds from his prior clinic visit   Disposition:   F/U  6 months   Orders Placed This Encounter  Procedures  . EKG 12-Lead     Signed, Dossie Arbour, M.D., Ph.D. 07/29/2016  [facility] [place], [place] [phone]

## 2016-07-29 NOTE — Patient Instructions (Addendum)
Medication Instructions:   Amiodarone 400 mg twice a day for 5 days Then 200 mg twice a day EKG 2 weeks (nurse visit) If still in atrial fib, We will schedule a cardioversion   Labwork:  No labs needed  Testing/Procedures:  No further testing needed  Follow-Up: It was a pleasure seeing you in the office today. Please call us if you have new issues that need to be addressed before your next appt.  [phone]  Your physician wants you to follow-up in: 1 month.    If you need a refill on your cardiac medications before your next appointment, please call your pharmacy.

## 2016-07-29 NOTE — Telephone Encounter (Signed)
Pt calling stating he picked up his prescription amiodarone  He is asking about the dose There's a difference in what Dr Mariah MillingGollan told patient and what he picked up  Would like a call back please   Dr Mariah MillingGollan told patient  400 mg 2 a day for five days Pharmacy filled the prescription saying  200 mg 1 tablet 2 x day for five days

## 2016-08-04 ENCOUNTER — Ambulatory Visit (INDEPENDENT_AMBULATORY_CARE_PROVIDER_SITE_OTHER): Payer: Commercial Managed Care - PPO | Admitting: Psychology

## 2016-08-04 DIAGNOSIS — F4323 Adjustment disorder with mixed anxiety and depressed mood: Secondary | ICD-10-CM | POA: Diagnosis not present

## 2016-08-09 ENCOUNTER — Telehealth: Payer: Self-pay | Admitting: Cardiovascular Disease

## 2016-08-09 NOTE — Telephone Encounter (Signed)
Pt states he is back in sinus rhythm, so he does not need his cardioversion appt.

## 2016-08-16 ENCOUNTER — Ambulatory Visit (INDEPENDENT_AMBULATORY_CARE_PROVIDER_SITE_OTHER): Payer: Commercial Managed Care - PPO | Admitting: Psychology

## 2016-08-16 DIAGNOSIS — F4323 Adjustment disorder with mixed anxiety and depressed mood: Secondary | ICD-10-CM | POA: Diagnosis not present

## 2016-08-19 ENCOUNTER — Other Ambulatory Visit: Payer: Self-pay | Admitting: *Deleted

## 2016-08-19 MED ORDER — AMIODARONE HCL 200 MG PO TABS: 200.0000 mg | ORAL_TABLET | Freq: Two times a day (BID) | ORAL | 3 refills | Status: DC

## 2016-08-19 MED ORDER — DILTIAZEM HCL ER COATED BEADS 120 MG PO CP24: 120.0000 mg | ORAL_CAPSULE | Freq: Every day | ORAL | 3 refills | Status: DC

## 2016-08-19 MED ORDER — APIXABAN 5 MG PO TABS: 5.0000 mg | ORAL_TABLET | Freq: Two times a day (BID) | ORAL | 3 refills | Status: DC

## 2016-08-30 ENCOUNTER — Ambulatory Visit (INDEPENDENT_AMBULATORY_CARE_PROVIDER_SITE_OTHER): Payer: Commercial Managed Care - PPO | Admitting: Psychology

## 2016-08-30 DIAGNOSIS — F4323 Adjustment disorder with mixed anxiety and depressed mood: Secondary | ICD-10-CM

## 2016-09-06 ENCOUNTER — Ambulatory Visit: Payer: Commercial Managed Care - PPO | Admitting: Psychology

## 2016-09-09 ENCOUNTER — Ambulatory Visit (INDEPENDENT_AMBULATORY_CARE_PROVIDER_SITE_OTHER): Payer: Commercial Managed Care - PPO | Admitting: Cardiovascular Disease

## 2016-09-09 ENCOUNTER — Encounter: Payer: Self-pay | Admitting: Cardiovascular Disease

## 2016-09-09 VITALS — BP 140/86 | HR 66 | Ht 70.0 in | Wt 214.5 lb

## 2016-09-09 DIAGNOSIS — E119 Type 2 diabetes mellitus without complications: Secondary | ICD-10-CM

## 2016-09-09 DIAGNOSIS — E7800 Pure hypercholesterolemia, unspecified: Secondary | ICD-10-CM | POA: Diagnosis not present

## 2016-09-09 DIAGNOSIS — I481 Persistent atrial fibrillation: Secondary | ICD-10-CM | POA: Diagnosis not present

## 2016-09-09 DIAGNOSIS — I480 Paroxysmal atrial fibrillation: Secondary | ICD-10-CM | POA: Diagnosis not present

## 2016-09-09 DIAGNOSIS — Z87891 Personal history of nicotine dependence: Secondary | ICD-10-CM

## 2016-09-09 DIAGNOSIS — I10 Essential (primary) hypertension: Secondary | ICD-10-CM

## 2016-09-09 DIAGNOSIS — Z23 Encounter for immunization: Secondary | ICD-10-CM

## 2016-09-09 DIAGNOSIS — I4819 Other persistent atrial fibrillation: Secondary | ICD-10-CM

## 2016-09-09 NOTE — Patient Instructions (Addendum)
Medication Instructions:   Please decrease the amiodarone down to one a day  Please monitor blood pressure If it runs low, drink fluids, Move the metoprolol to evening  Labwork:  No new labs needed  Testing/Procedures:  No further testing at this time   Follow-Up: It was a pleasure seeing you in the office today. Please call us if you have new issues that need to be addressed before your next appt.  [phone]  Your physician wants you to follow-up in: 6 months.  You will receive a reminder letter in the mail two months in advance. If you don't receive a letter, please call our office to schedule the follow-up appointment.  If you need a refill on your cardiac medications before your next appointment, please call your pharmacy.

## 2016-09-09 NOTE — Progress Notes (Signed)
Cardiology Office Note  Date:  09/09/2016   ID:  Jonathon Middleton, DOB 02/01/1951, MRN [id number]  PCP:  Jonathon BoydenJavier Gutierrez, MD   Chief Complaint  Patient presents with  . Other    1 month follow up.     HPI:  Mr Jonathon Middleton is a very pleasant 65 year old gentleman with prior smoking history for 5 years who stopped in 1984, prior history of atrial fibrillation in January 2014 converted to normal sinus rhythm with medical management who presents for follow-up of his atrial fibrillation.  On his last clinic visit he was started on amiodarone load for 5 days then down to 200 mg twice a day. He reports that after 2 weeks he converted to lower regular rate consistent with sinus rhythm  Feels well in general, denies shortness of breath, no leg swelling Tolerating metoprolol succinate 50 mill grams daily, diltiazem 120 No signs of bleeding on eiquis twice a day  Trying to watch his diet, losing weight, down  Using treadmill  EKG on today's visit shows normal sinus rhythm with rate 66 bpm, no significant ST or T-wave changes  Other past medical history HBA1C 6.6 Total chol 120, ldl 57  Chads vascular score of 1.   Seen by his primary care physician, Dr. Sharen Middleton, on 09/19/2013, noted to be in atrial fibrillation with rate in the 140 range. He was relatively asymptomatic. rate improved back down to normal on October 18.   he had asymptomatic atrial fibrillation in January 2014. He did have some mild malaise, bloating in his chest. He was found on routine physical exam by Dr. Alison Middleton to have a heart rate of 140 beats per minute. [facility], started on metoprolol. He had echocardiogram at that time which was essentially normal. Also had stress test with normal treadmill   father had heart attack at age 64 and died. He was a long-time smoker. Mother lived to [id number]  PMH:   has a past medical history of Atrial fibrillation (HCC); Depression with anxiety; GERD  (gastroesophageal reflux disease); History of chicken pox; Hyperlipidemia; Hypertension; Nonalcoholic fatty liver disease; and Prediabetes.  PSH:    Past Surgical History:  Procedure Laterality Date  . KNEE SURGERY Right 2009   x 2 (Dr. Ernest PineHooten)    Current Outpatient Prescriptions  Medication Sig Dispense Refill  . amiodarone (PACERONE) 200 MG tablet Take 1 tablet (200 mg total) by mouth 2 (two) times daily. 180 tablet 3  . apixaban (ELIQUIS) 5 MG TABS tablet Take 1 tablet (5 mg total) by mouth 2 (two) times daily. 180 tablet 3  . atorvastatin (LIPITOR) 40 MG tablet Take 1 tablet (40 mg total) by mouth at bedtime. 90 tablet 3  . Cholecalciferol (VITAMIN D) 2000 units CAPS Take by mouth 2 (two) times daily.    Marland Kitchen. CINNAMON PO Take 500 mg by mouth daily.     . Coenzyme Q10 (CO Q-10) 100 MG CAPS Take 100 mg by mouth daily.    Marland Kitchen. diltiazem (CARDIZEM CD) 120 MG 24 hr capsule Take 1 capsule (120 mg total) by mouth daily. 90 capsule 3  . glucose blood test strip Check sugars once daily and as needed Dx:R73.03 **One Touch Ultra Blue** 100 each 3  . Lancets (ONETOUCH ULTRASOFT) lancets USE AS INSTRUCTED 100 each 0  . lansoprazole (PREVACID) 15 MG capsule Take 15 mg by mouth daily.     . Melatonin 10 MG TABS Take by mouth daily.    . metFORMIN (GLUCOPHAGE) 500 MG  tablet Take 1 tablet (500 mg total) by mouth daily with breakfast. 90 tablet 3  . metoprolol succinate (TOPROL-XL) 50 MG 24 hr tablet Take 1 tablet by mouth  daily 90 tablet 1  . Multiple Vitamin (MULTIVITAMIN) tablet Take 1 tablet by mouth daily.    Marland Kitchen omega-3 fish oil (MAXEPA) 1000 MG CAPS capsule Take 2 capsules by mouth 2 (two) times daily.    . sertraline (ZOLOFT) 50 MG tablet Take 1 tablet (50 mg total) by mouth daily. 90 tablet 3   No current facility-administered medications for this visit.      Allergies:   Review of patient's allergies indicates no known allergies.   Social History:  The patient  reports that he quit smoking  about 33 years ago. His smoking use included Cigars. He has a 5.00 pack-year smoking history. He has never used smokeless tobacco. He reports that he drinks about 1.2 oz of alcohol per week . He reports that he does not use drugs.   Family History:   family history includes CAD (age of onset: 25) in his father; CAD (age of onset: 29) in his paternal uncle; Cancer in his maternal uncle; Diabetes in his paternal grandmother; Hypertension in his mother; Stroke in his paternal aunt.    Review of Systems: Review of Systems  Constitutional: Negative.   Respiratory: Negative.   Cardiovascular: Negative.   Gastrointestinal: Negative.   Musculoskeletal: Negative.   Neurological: Negative.   Psychiatric/Behavioral: Negative.   All other systems reviewed and are negative.    PHYSICAL EXAM: VS:  BP 140/86 (BP Location: Left Arm, Patient Position: Sitting, Cuff Size: Normal)   Pulse 66   Ht 5\' 10"  (1.778 m)   Wt 214 lb 8 oz (97.3 kg)   BMI 30.78 kg/m  , BMI Body mass index is 30.78 kg/m. GEN: Well nourished, well developed, in no acute distress  HEENT: normal  Neck: no JVD, carotid bruits, or masses Cardiac: RRR; no murmurs, rubs, or gallops,no edema  Respiratory:  clear to auscultation bilaterally, normal work of breathing GI: soft, nontender, nondistended, + BS MS: no deformity or atrophy  Skin: warm and dry, no rash Neuro:  Strength and sensation are intact Psych: euthymic mood, full affect    Recent Labs: 06/01/2016: ALT 18; BUN 20; Creatinine, Ser 1.09; Potassium 4.3; Sodium 141    Lipid Panel Lab Results  Component Value Date   CHOL 120 06/01/2016   HDL 39.20 06/01/2016   LDLCALC 57 06/01/2016   TRIG 121.0 06/01/2016      Wt Readings from Last 3 Encounters:  09/09/16 214 lb 8 oz (97.3 kg)  07/29/16 214 lb 8 oz (97.3 kg)  07/04/16 218 lb (98.9 kg)       ASSESSMENT AND PLAN:  Persistent atrial fibrillation (HCC) - Plan: EKG 12-Lead Converted pharmacologically  since his last clinic visit We'll decrease amiodarone down to 1 a day, 200 mg In the future, potentially could even try 100 mg daily if he would like We'll continue anticoagulation, diltiazem, metoprolol  Hyperlipidemia Cholesterol is at goal on the current lipid regimen. No changes to the medications were made.  Essential hypertension He reports episodes of low pressure Recommended he monitor blood pressure and if this runs low, may need to decrease his metoprolol Encouraged him to stay hydrated  Smoking history We have encouraged him to continue to work on weaning his cigarettes and smoking cessation. He will continue to work on this and does not want any assistance with chantix.  Controlled type 2 diabetes mellitus without complication, without long-term current use of insulin (HCC) We have encouraged continued exercise, careful diet management in an effort to lose weight.  weight down 5 pounds from his prior clinic visit    Total encounter time more than 15 minutes  Greater than 50% was spent in counseling and coordination of care with the patient   Disposition:   F/U  6 months   Orders Placed This Encounter  Procedures  . Flu Vaccine QUAD 36+ mos IM  . EKG 12-Lead     Signed, Dossie Arbour, M.D., Ph.D. 09/09/2016  [facility] [place], [place] [phone]

## 2016-09-22 ENCOUNTER — Ambulatory Visit (INDEPENDENT_AMBULATORY_CARE_PROVIDER_SITE_OTHER): Payer: Commercial Managed Care - PPO | Admitting: Psychology

## 2016-09-22 DIAGNOSIS — F4323 Adjustment disorder with mixed anxiety and depressed mood: Secondary | ICD-10-CM

## 2016-10-13 ENCOUNTER — Ambulatory Visit (INDEPENDENT_AMBULATORY_CARE_PROVIDER_SITE_OTHER): Payer: Commercial Managed Care - PPO | Admitting: Psychology

## 2016-10-13 DIAGNOSIS — F4323 Adjustment disorder with mixed anxiety and depressed mood: Secondary | ICD-10-CM | POA: Diagnosis not present

## 2016-11-03 ENCOUNTER — Ambulatory Visit (INDEPENDENT_AMBULATORY_CARE_PROVIDER_SITE_OTHER): Payer: Commercial Managed Care - PPO | Admitting: Psychology

## 2016-11-03 DIAGNOSIS — F4323 Adjustment disorder with mixed anxiety and depressed mood: Secondary | ICD-10-CM

## 2016-11-06 ENCOUNTER — Other Ambulatory Visit: Payer: Self-pay | Admitting: Family Medicine

## 2016-11-06 DIAGNOSIS — R972 Elevated prostate specific antigen [PSA]: Secondary | ICD-10-CM

## 2016-11-06 DIAGNOSIS — E119 Type 2 diabetes mellitus without complications: Secondary | ICD-10-CM

## 2016-11-06 DIAGNOSIS — Z1159 Encounter for screening for other viral diseases: Secondary | ICD-10-CM

## 2016-11-07 ENCOUNTER — Other Ambulatory Visit (INDEPENDENT_AMBULATORY_CARE_PROVIDER_SITE_OTHER): Payer: Commercial Managed Care - PPO

## 2016-11-07 DIAGNOSIS — E119 Type 2 diabetes mellitus without complications: Secondary | ICD-10-CM | POA: Diagnosis not present

## 2016-11-07 DIAGNOSIS — Z1159 Encounter for screening for other viral diseases: Secondary | ICD-10-CM

## 2016-11-07 DIAGNOSIS — R972 Elevated prostate specific antigen [PSA]: Secondary | ICD-10-CM | POA: Diagnosis not present

## 2016-11-07 LAB — BASIC METABOLIC PANEL
BUN: 17 mg/dL (ref 6–23)
CALCIUM: 9 mg/dL (ref 8.4–10.5)
CO2: 32 mEq/L (ref 19–32)
Chloride: 105 mEq/L (ref 96–112)
Creatinine, Ser: 1.24 mg/dL (ref 0.40–1.50)
GFR: 62.36 mL/min (ref >60.00)
GLUCOSE: 117 mg/dL — AB (ref 70–99)
Potassium: 4.3 mEq/L (ref 3.5–5.1)
Sodium: 144 mEq/L (ref 135–145)

## 2016-11-07 LAB — PSA: PSA: 1.4 ng/mL (ref 0.10–4.00)

## 2016-11-07 LAB — HEMOGLOBIN A1C: Hgb A1c MFr Bld: 6.4 % (ref 4.6–6.5)

## 2016-11-08 LAB — HEPATITIS C ANTIBODY: HCV Ab: NEGATIVE

## 2016-11-17 ENCOUNTER — Ambulatory Visit (INDEPENDENT_AMBULATORY_CARE_PROVIDER_SITE_OTHER): Payer: Commercial Managed Care - PPO | Admitting: Psychology

## 2016-11-17 DIAGNOSIS — F4323 Adjustment disorder with mixed anxiety and depressed mood: Secondary | ICD-10-CM | POA: Diagnosis not present

## 2016-12-08 ENCOUNTER — Ambulatory Visit (INDEPENDENT_AMBULATORY_CARE_PROVIDER_SITE_OTHER): Payer: Commercial Managed Care - PPO | Admitting: Psychology

## 2016-12-08 DIAGNOSIS — F4323 Adjustment disorder with mixed anxiety and depressed mood: Secondary | ICD-10-CM | POA: Diagnosis not present

## 2016-12-22 ENCOUNTER — Ambulatory Visit: Payer: Commercial Managed Care - PPO | Admitting: Psychology

## 2017-01-02 ENCOUNTER — Other Ambulatory Visit: Payer: Self-pay | Admitting: Family Medicine

## 2017-01-05 ENCOUNTER — Ambulatory Visit (INDEPENDENT_AMBULATORY_CARE_PROVIDER_SITE_OTHER): Payer: Commercial Managed Care - PPO | Admitting: Psychology

## 2017-01-05 DIAGNOSIS — F4323 Adjustment disorder with mixed anxiety and depressed mood: Secondary | ICD-10-CM

## 2017-01-19 ENCOUNTER — Ambulatory Visit (INDEPENDENT_AMBULATORY_CARE_PROVIDER_SITE_OTHER): Payer: Commercial Managed Care - PPO | Admitting: Psychology

## 2017-01-19 DIAGNOSIS — F4323 Adjustment disorder with mixed anxiety and depressed mood: Secondary | ICD-10-CM | POA: Diagnosis not present

## 2017-02-02 ENCOUNTER — Ambulatory Visit (INDEPENDENT_AMBULATORY_CARE_PROVIDER_SITE_OTHER): Payer: Commercial Managed Care - PPO | Admitting: Psychology

## 2017-02-02 DIAGNOSIS — F4323 Adjustment disorder with mixed anxiety and depressed mood: Secondary | ICD-10-CM

## 2017-02-16 ENCOUNTER — Ambulatory Visit (INDEPENDENT_AMBULATORY_CARE_PROVIDER_SITE_OTHER): Payer: Commercial Managed Care - PPO | Admitting: Psychology

## 2017-02-16 DIAGNOSIS — F4323 Adjustment disorder with mixed anxiety and depressed mood: Secondary | ICD-10-CM | POA: Diagnosis not present

## 2017-03-02 ENCOUNTER — Ambulatory Visit (INDEPENDENT_AMBULATORY_CARE_PROVIDER_SITE_OTHER): Payer: Commercial Managed Care - PPO | Admitting: Psychology

## 2017-03-02 DIAGNOSIS — F4323 Adjustment disorder with mixed anxiety and depressed mood: Secondary | ICD-10-CM

## 2017-03-14 NOTE — Progress Notes (Signed)
Cardiology Office Note  Date:  03/16/2017   ID:  Jonathon Middleton, DOB 1951-08-10, MRN [id number]  PCP:  Eustaquio Boyden, MD   Chief Complaint  Patient presents with  . other    88mo f/u. Pt c/o sob on exertion; denies cp. Reviewed meds with pt verbally.    HPI:  Jonathon Middleton is a very pleasant 66 year old gentleman with  smoking history for 5 years who stopped in 1984, PAF, 12/2012, 09/2013, 06/2016 , 07/2016, Chads vasc score of 1.  atrial fibrillation in 10/ 2014 converted to normal sinus rhythm  who presents for follow-up of his atrial fibrillation.  Feels well,  Started treadmill, 30 min No atrial fib Trying to lose weight Feels like heart rate not going fast enough denies shortness of breath, no leg swelling Would like to decrease some of his medications Tolerating metoprolol succinate 50 mill grams daily, diltiazem 120  Previously treated with amiodarone load for paroxysmal atriual fibrillation on amiodarone load for 5 days then down to 200 mg twice a day. He reports that after 2 weeks he converted to lower regular rate consistent with sinus rhythm  Lab work reviewed No recent TSH or hepatic panel (amiodarone surveillance labs)  EKG Personally reviewed by myself on today's visit shows normal sinus rhythm with rate 60 bpm, no significant ST or T-wave changes  Other past medical history HBA1C 6.6 Total chol 120, ldl 57  Chads vasc score of 1.   Seen by his primary care physician, Dr. Sharen Hones, on 09/19/2013, noted to be in atrial fibrillation with rate in the 140 range. He was relatively asymptomatic. rate improved back down to normal on October 18.   he had asymptomatic atrial fibrillation in January 2014. He did have some mild malaise, bloating in his chest. He was found on routine physical exam by Dr. Alison Murray to have a heart rate of 140 beats per minute. He was referred to [facility] cardiology, started on metoprolol. He had echocardiogram at that time which was  essentially normal. Also had stress test with normal treadmill   father had heart attack at age 16 and died. He was a long-time smoker. Mother lived to 51  PMH:   has a past medical history of Atrial fibrillation (HCC); Depression with anxiety; GERD (gastroesophageal reflux disease); History of chicken pox; Hyperlipidemia; Hypertension; Nonalcoholic fatty liver disease; and Prediabetes.  PSH:    Past Surgical History:  Procedure Laterality Date  . KNEE SURGERY Right 2009   x 2 (Dr. Ernest Pine)    Current Outpatient Prescriptions  Medication Sig Dispense Refill  . amiodarone (PACERONE) 200 MG tablet Take 1 tablet (200 mg total) by mouth 2 (two) times daily. 180 tablet 3  . apixaban (ELIQUIS) 5 MG TABS tablet Take 1 tablet (5 mg total) by mouth 2 (two) times daily. 180 tablet 3  . atorvastatin (LIPITOR) 40 MG tablet Take 1 tablet (40 mg total) by mouth at bedtime. 90 tablet 3  . Cholecalciferol (VITAMIN D) 2000 units CAPS Take by mouth 2 (two) times daily.    Marland Kitchen CINNAMON PO Take 1,000 mg by mouth 2 (two) times daily.     . Coenzyme Q10 (CO Q-10) 100 MG CAPS Take 100 mg by mouth daily.    Marland Kitchen diltiazem (CARDIZEM CD) 120 MG 24 hr capsule Take 1 capsule (120 mg total) by mouth daily. 90 capsule 3  . glucose blood test strip Check sugars once daily and as needed Dx:R73.03 **One Touch Ultra Blue** 100 each 3  . Lancets [facility]  ULTRASOFT) lancets USE AS INSTRUCTED 100 each 0  . Melatonin 10 MG TABS Take by mouth daily.    . metFORMIN (GLUCOPHAGE) 500 MG tablet Take 1 tablet (500 mg total) by mouth daily with breakfast. 90 tablet 3  . metoprolol succinate (TOPROL-XL) 50 MG 24 hr tablet TAKE 1 TABLET BY MOUTH  DAILY 90 tablet 1  . Multiple Vitamin (MULTIVITAMIN) tablet Take 1 tablet by mouth daily.    Marland Kitchen omega-3 fish oil (MAXEPA) 1000 MG CAPS capsule Take 2 capsules by mouth 2 (two) times daily.    . sertraline (ZOLOFT) 50 MG tablet Take 1 tablet (50 mg total) by mouth daily. 90 tablet 3   No current  facility-administered medications for this visit.      Allergies:   Patient has no known allergies.   Social History:  The patient  reports that he quit smoking about 33 years ago. His smoking use included Cigars. He has a 5.00 pack-year smoking history. He has never used smokeless tobacco. He reports that he drinks about 1.2 oz of alcohol per week . He reports that he does not use drugs.   Family History:   family history includes CAD (age of onset: 42) in his father; CAD (age of onset: 13) in his paternal uncle; Cancer in his maternal uncle; Diabetes in his paternal grandmother; Hypertension in his mother; Stroke in his paternal aunt.    Review of Systems: Review of Systems  Constitutional: Negative.   Respiratory: Negative.   Cardiovascular: Negative.   Gastrointestinal: Negative.   Musculoskeletal: Negative.   Neurological: Negative.   Psychiatric/Behavioral: Negative.   All other systems reviewed and are negative.    PHYSICAL EXAM: VS:  BP 138/82 (BP Location: Left Arm, Patient Position: Sitting, Cuff Size: Normal)   Pulse 60   Ht  (1.803 m)   Wt 224 lb (101.6 kg)   BMI 31.24 kg/m  , BMI Body mass index is 31.24 kg/m. GEN: Well nourished, well developed, in no acute distress, obese  HEENT: normal  Neck: no JVD, carotid bruits, or masses Cardiac: RRR; no murmurs, rubs, or gallops,no edema  Respiratory:  clear to auscultation bilaterally, normal work of breathing GI: soft, nontender, nondistended, + BS MS: no deformity or atrophy  Skin: warm and dry, no rash Neuro:  Strength and sensation are intact Psych: euthymic mood, full affect    Recent Labs: 06/01/2016: ALT 18 11/07/2016: BUN 17; Creatinine, Ser 1.24; Potassium 4.3; Sodium 144    Lipid Panel Lab Results  Component Value Date   CHOL 120 06/01/2016   HDL 39.20 06/01/2016   LDLCALC 57 06/01/2016   TRIG 121.0 06/01/2016      Wt Readings from Last 3 Encounters:  03/16/17 224 lb (101.6 kg)   09/09/16 214 lb 8 oz (97.3 kg)  07/29/16 214 lb 8 oz (97.3 kg)       ASSESSMENT AND PLAN:  Paroxysmal atrial fibrillation (HCC) - Plan: EKG 12-Lead Long discussion concerning his paroxysmal atrial fibrillation He would like to decrease some of his medications as he feels his heart rate is being restricted on the treadmill. We will defer changes to him. Discussed he could decrease metoprolol in half for decrease amiodarone in half Amiodarone surveillance lab works ordered for today  Hyperlipidemia Cholesterol is at goal on the current lipid regimen. No changes to the medications were made.  Essential hypertension Blood pressure is well controlled on today's visit. No changes made to the medications.  Smoking history We have encouraged  him to continue to work on weaning his cigarettes and smoking cessation. He will continue to work on this and does not want any assistance with chantix.   Controlled type 2 diabetes mellitus without complication, without long-term current use of insulin (HCC) Weight is up compared to his prior clinic visit, he has started working out on the treadmill   Total encounter time more than 25 minutes  Greater than 50% was spent in counseling and coordination of care with the patient   Disposition:   F/U  12 months   No orders of the defined types were placed in this encounter.    Signed, Dossie Arbour, M.D., Ph.D. 03/16/2017  [facility] [place], [place] [phone]

## 2017-03-16 ENCOUNTER — Encounter: Payer: Self-pay | Admitting: Cardiovascular Disease

## 2017-03-16 ENCOUNTER — Ambulatory Visit (INDEPENDENT_AMBULATORY_CARE_PROVIDER_SITE_OTHER): Payer: Commercial Managed Care - PPO | Admitting: Psychology

## 2017-03-16 ENCOUNTER — Ambulatory Visit (INDEPENDENT_AMBULATORY_CARE_PROVIDER_SITE_OTHER): Payer: Commercial Managed Care - PPO | Admitting: Cardiovascular Disease

## 2017-03-16 VITALS — BP 138/82 | HR 60 | Ht 71.0 in | Wt 224.0 lb

## 2017-03-16 DIAGNOSIS — I10 Essential (primary) hypertension: Secondary | ICD-10-CM | POA: Diagnosis not present

## 2017-03-16 DIAGNOSIS — I480 Paroxysmal atrial fibrillation: Secondary | ICD-10-CM | POA: Diagnosis not present

## 2017-03-16 DIAGNOSIS — Z87891 Personal history of nicotine dependence: Secondary | ICD-10-CM

## 2017-03-16 DIAGNOSIS — F4323 Adjustment disorder with mixed anxiety and depressed mood: Secondary | ICD-10-CM

## 2017-03-16 DIAGNOSIS — E782 Mixed hyperlipidemia: Secondary | ICD-10-CM

## 2017-03-16 DIAGNOSIS — E119 Type 2 diabetes mellitus without complications: Secondary | ICD-10-CM | POA: Diagnosis not present

## 2017-03-16 DIAGNOSIS — F339 Major depressive disorder, recurrent, unspecified: Secondary | ICD-10-CM

## 2017-03-16 NOTE — Patient Instructions (Signed)
Medication Instructions:   No medication changes made  Labwork:  We will check a TSH and hepatic panel  Testing/Procedures:  No further testing at this time   I recommend watching educational videos on topics of interest to you at:       [email or web address]  Enter code: [email or web address]    Follow-Up: It was a pleasure seeing you in the office today. Please call us if you have new issues that need to be addressed before your next appt.  [phone]  Your physician wants you to follow-up in: 12 months.  You will receive a reminder letter in the mail two months in advance. If you don't receive a letter, please call our office to schedule the follow-up appointment.  If you need a refill on your cardiac medications before your next appointment, please call your pharmacy.

## 2017-03-17 ENCOUNTER — Other Ambulatory Visit: Payer: Self-pay | Admitting: Family Medicine

## 2017-03-17 LAB — TSH: TSH: 2.65 u[IU]/mL (ref 0.450–4.500)

## 2017-03-17 LAB — HEPATIC FUNCTION PANEL
ALK PHOS: 109 IU/L (ref 39–117)
ALT: 46 IU/L — ABNORMAL HIGH (ref 0–44)
AST: 33 IU/L (ref 0–40)
Albumin: 4.4 g/dL (ref 3.6–4.8)
Bilirubin Total: 0.5 mg/dL (ref 0.0–1.2)
Bilirubin, Direct: 0.15 mg/dL (ref 0.00–0.40)
Total Protein: 6.6 g/dL (ref 6.0–8.5)

## 2017-03-24 ENCOUNTER — Telehealth: Payer: Self-pay

## 2017-03-24 NOTE — Telephone Encounter (Signed)
[facility] left v/m requesting verification of instructions for test strips.Spoke with Sue Lush at [employer] [employer] and verified order from med list.nothing further needed.

## 2017-03-30 ENCOUNTER — Ambulatory Visit (INDEPENDENT_AMBULATORY_CARE_PROVIDER_SITE_OTHER): Payer: Commercial Managed Care - PPO | Admitting: Psychology

## 2017-03-30 DIAGNOSIS — F4323 Adjustment disorder with mixed anxiety and depressed mood: Secondary | ICD-10-CM | POA: Diagnosis not present

## 2017-04-13 ENCOUNTER — Ambulatory Visit (INDEPENDENT_AMBULATORY_CARE_PROVIDER_SITE_OTHER): Payer: Commercial Managed Care - PPO | Admitting: Psychology

## 2017-04-13 DIAGNOSIS — F4323 Adjustment disorder with mixed anxiety and depressed mood: Secondary | ICD-10-CM | POA: Diagnosis not present

## 2017-04-27 ENCOUNTER — Ambulatory Visit: Payer: Commercial Managed Care - PPO | Admitting: Psychology

## 2017-05-11 ENCOUNTER — Ambulatory Visit (INDEPENDENT_AMBULATORY_CARE_PROVIDER_SITE_OTHER): Payer: Commercial Managed Care - PPO | Admitting: Psychology

## 2017-05-11 DIAGNOSIS — F4323 Adjustment disorder with mixed anxiety and depressed mood: Secondary | ICD-10-CM | POA: Diagnosis not present

## 2017-05-18 ENCOUNTER — Other Ambulatory Visit: Payer: Self-pay | Admitting: Family Medicine

## 2017-05-25 ENCOUNTER — Ambulatory Visit (INDEPENDENT_AMBULATORY_CARE_PROVIDER_SITE_OTHER): Payer: Commercial Managed Care - PPO | Admitting: Psychology

## 2017-05-25 DIAGNOSIS — F4323 Adjustment disorder with mixed anxiety and depressed mood: Secondary | ICD-10-CM

## 2017-06-08 ENCOUNTER — Ambulatory Visit (INDEPENDENT_AMBULATORY_CARE_PROVIDER_SITE_OTHER): Payer: Commercial Managed Care - PPO | Admitting: Psychology

## 2017-06-08 DIAGNOSIS — F4323 Adjustment disorder with mixed anxiety and depressed mood: Secondary | ICD-10-CM

## 2017-06-17 ENCOUNTER — Other Ambulatory Visit: Payer: Self-pay | Admitting: Family Medicine

## 2017-06-22 ENCOUNTER — Ambulatory Visit (INDEPENDENT_AMBULATORY_CARE_PROVIDER_SITE_OTHER): Payer: Commercial Managed Care - PPO | Admitting: Psychology

## 2017-06-22 DIAGNOSIS — F4323 Adjustment disorder with mixed anxiety and depressed mood: Secondary | ICD-10-CM | POA: Diagnosis not present

## 2017-07-06 ENCOUNTER — Ambulatory Visit (INDEPENDENT_AMBULATORY_CARE_PROVIDER_SITE_OTHER): Payer: Commercial Managed Care - PPO | Admitting: Psychology

## 2017-07-06 DIAGNOSIS — F4323 Adjustment disorder with mixed anxiety and depressed mood: Secondary | ICD-10-CM

## 2017-07-20 ENCOUNTER — Ambulatory Visit (INDEPENDENT_AMBULATORY_CARE_PROVIDER_SITE_OTHER): Payer: Commercial Managed Care - PPO | Admitting: Psychology

## 2017-07-20 DIAGNOSIS — F4323 Adjustment disorder with mixed anxiety and depressed mood: Secondary | ICD-10-CM | POA: Diagnosis not present

## 2017-08-03 ENCOUNTER — Ambulatory Visit (INDEPENDENT_AMBULATORY_CARE_PROVIDER_SITE_OTHER): Payer: Commercial Managed Care - PPO | Admitting: Psychology

## 2017-08-03 DIAGNOSIS — F4323 Adjustment disorder with mixed anxiety and depressed mood: Secondary | ICD-10-CM

## 2017-08-17 ENCOUNTER — Ambulatory Visit (INDEPENDENT_AMBULATORY_CARE_PROVIDER_SITE_OTHER): Payer: Commercial Managed Care - PPO | Admitting: Psychology

## 2017-08-17 DIAGNOSIS — F4323 Adjustment disorder with mixed anxiety and depressed mood: Secondary | ICD-10-CM | POA: Diagnosis not present

## 2017-08-19 ENCOUNTER — Encounter: Payer: Self-pay | Admitting: Medical Oncology

## 2017-08-19 ENCOUNTER — Emergency Department
Admission: EM | Admit: 2017-08-19 | Discharge: 2017-08-19 | Disposition: A | Discharge status: Home / Self Care | Payer: Commercial Managed Care - PPO | Attending: Emergency Medicine | Admitting: Emergency Medicine

## 2017-08-19 ENCOUNTER — Emergency Department: Payer: Commercial Managed Care - PPO

## 2017-08-19 DIAGNOSIS — Y939 Activity, unspecified: Secondary | ICD-10-CM | POA: Diagnosis not present

## 2017-08-19 DIAGNOSIS — Y929 Unspecified place or not applicable: Secondary | ICD-10-CM | POA: Diagnosis not present

## 2017-08-19 DIAGNOSIS — Y999 Unspecified external cause status: Secondary | ICD-10-CM | POA: Diagnosis not present

## 2017-08-19 DIAGNOSIS — W11XXXA Fall on and from ladder, initial encounter: Secondary | ICD-10-CM | POA: Diagnosis not present

## 2017-08-19 DIAGNOSIS — E119 Type 2 diabetes mellitus without complications: Secondary | ICD-10-CM | POA: Diagnosis not present

## 2017-08-19 DIAGNOSIS — Z7984 Long term (current) use of oral hypoglycemic drugs: Secondary | ICD-10-CM | POA: Insufficient documentation

## 2017-08-19 DIAGNOSIS — S42321A Displaced transverse fracture of shaft of humerus, right arm, initial encounter for closed fracture: Secondary | ICD-10-CM | POA: Diagnosis not present

## 2017-08-19 DIAGNOSIS — I10 Essential (primary) hypertension: Secondary | ICD-10-CM | POA: Insufficient documentation

## 2017-08-19 DIAGNOSIS — Z87891 Personal history of nicotine dependence: Secondary | ICD-10-CM | POA: Insufficient documentation

## 2017-08-19 DIAGNOSIS — S4991XA Unspecified injury of right shoulder and upper arm, initial encounter: Secondary | ICD-10-CM | POA: Diagnosis present

## 2017-08-19 DIAGNOSIS — Z79899 Other long term (current) drug therapy: Secondary | ICD-10-CM | POA: Insufficient documentation

## 2017-08-19 MED ORDER — OXYCODONE-ACETAMINOPHEN 5-325 MG PO TABS
1.0000 | ORAL_TABLET | Freq: Once | ORAL | Status: AC
  Administered 2017-08-19: 1 via ORAL
  Filled 2017-08-19: qty 1

## 2017-08-19 MED ORDER — OXYCODONE HCL 5 MG PO TABS: 5.0000 mg | ORAL_TABLET | Freq: Three times a day (TID) | ORAL | 0 refills | Status: DC | PRN

## 2017-08-19 NOTE — Discharge Instructions (Signed)
Please continue with splint all times. Keep splint clean and dry. Take Tylenol and/or ibuprofen as needed for mild to moderate pain. Use oxycodone as needed for severe pain. Call orthopedic office first thing Monday morning to schedule a follow-up appointment.

## 2017-08-19 NOTE — ED Triage Notes (Signed)
Pt reports falling approx 60ft off of a ladder onto his rt arm, there is obvious injury to center of humerus. Pt denies hitting head/loc. Denies pain anywhere else.

## 2017-08-19 NOTE — ED Provider Notes (Signed)
[facility]-EMERGENCY DEPARTMENT Provider Note   CSN: [id number] Arrival date & time: 08/19/17  1847     History   Chief Complaint Chief Complaint  Patient presents with  . Arm Injury    HPI Jonathon Middleton is a 66 y.o. male presents to the emergency department for evaluation of right arm pain. Patient points to the mid shaft of his right humerus. He states he fell off of a ladder just prior to arrival, approximately 5 feet and landed on his right arm. Patient is unsure what he hit with his right arm, thinks it could be a step. He did not lose consciousness, hit his head or develop any other pain throughout his body. He denies any neck or hip or leg pain. He has been ambulatory with no complaints. Pain is mild, 4 out of 10. He has not taken any medications for pain.    HPI  Past Medical History:  Diagnosis Date  . Atrial fibrillation (HCC)   . Depression with anxiety   . GERD (gastroesophageal reflux disease)   . History of chicken pox   . Hyperlipidemia   . Hypertension   . Nonalcoholic fatty liver disease    ?h/o elevated LFTs, has not had [place]  . Prediabetes    treated with diet & exercise. saw [facility] nutritionist 04/2014    Patient Active Problem List   Diagnosis Date Noted  . Elevated PSA 06/27/2016  . Adjustment disorder with depressed mood 06/27/2016  . Health maintenance examination 04/17/2014  . Obesity, Class I, BMI 30-34.9 04/17/2014  . Nonalcoholic fatty liver disease   . GERD (gastroesophageal reflux disease)   . Paroxysmal atrial fibrillation (HCC) 09/16/2013  . Hyperlipidemia 09/16/2013  . Essential hypertension 09/16/2013  . Smoking history 09/16/2013  . MDD (major depressive disorder), recurrent episode (HCC) 09/16/2013  . Diabetes mellitus type 2, controlled, without complications (HCC) 09/16/2013    Past Surgical History:  Procedure Laterality Date  . KNEE SURGERY Right 2009   x 2 (Dr. Ernest Pine)       Home Medications    Prior to Admission  medications   Medication Sig Start Date End Date Taking? Authorizing Provider  amiodarone (PACERONE) 200 MG tablet Take 1 tablet (200 mg total) by mouth daily. 03/16/17   Antonieta Iba, MD  apixaban (ELIQUIS) 5 MG TABS tablet Take 1 tablet (5 mg total) by mouth 2 (two) times daily. 08/19/16   Antonieta Iba, MD  atorvastatin (LIPITOR) 40 MG tablet TAKE 1 TABLET BY MOUTH AT  BEDTIME 05/19/17   Eustaquio Boyden, MD  Cholecalciferol (VITAMIN D) 2000 units CAPS Take by mouth 2 (two) times daily.    [provider]  CINNAMON PO Take 1,000 mg by mouth 2 (two) times daily.     [provider]  Coenzyme Q10 (CO Q-10) 100 MG CAPS Take 100 mg by mouth daily.    [provider]  diltiazem (CARDIZEM CD) 120 MG 24 hr capsule Take 1 capsule (120 mg total) by mouth daily. 08/19/16 08/14/17  Antonieta Iba, MD  glucose blood (ONE TOUCH ULTRA TEST) test strip Use to check sugar once daily and as needed. Dx: E11.9 03/20/17   Eustaquio Boyden, MD  Lancets [facility] ULTRASOFT) lancets USE AS INSTRUCTED 10/06/14   Eustaquio Boyden, MD  Melatonin 10 MG TABS Take by mouth daily.    [provider]  metFORMIN (GLUCOPHAGE) 500 MG tablet TAKE 1 TABLET BY MOUTH  DAILY WITH BREAKFAST 05/19/17   Eustaquio Boyden, MD  metoprolol  succinate (TOPROL-XL) 50 MG 24 hr tablet Take 1 tablet (50 mg total) by mouth daily. NEEDS OFFICE VISIT 06/19/17   Eustaquio Boyden, MD  Multiple Vitamin (MULTIVITAMIN) tablet Take 1 tablet by mouth daily.    [provider]  omega-3 fish oil (MAXEPA) 1000 MG CAPS capsule Take 2 capsules by mouth 2 (two) times daily.    [provider]  oxyCODONE (ROXICODONE) 5 MG immediate release tablet Take 1 tablet (5 mg total) by mouth every 8 (eight) hours as needed. 08/19/17 08/19/18  Evon Slack, PA-C  sertraline (ZOLOFT) 50 MG tablet TAKE 1 TABLET BY MOUTH  DAILY 05/19/17   Eustaquio Boyden, MD    Family History Family History  Problem Relation  Age of Onset  . Hypertension Mother   . CAD Father 55       massive MI, deceased  . CAD Paternal Uncle 38       MI, smoker  . Stroke Paternal Aunt   . Diabetes Paternal Grandmother   . Cancer Maternal Uncle        lung cancer (non small cell), smoker    Social History Social History  Substance Use Topics  . Smoking status: Former Smoker    Packs/day: 1.00    Years: 5.00    Types: Cigars    Quit date: 04/04/1983  . Smokeless tobacco: Never Used  . Alcohol use 1.2 oz/week    2 Glasses of wine per week     Comment: regular 2 glasses wine/day 4-5ozper week     Allergies   Patient has no known allergies.   Review of Systems Review of Systems  Constitutional: Negative for fever.  Respiratory: Negative for shortness of breath.   Cardiovascular: Negative for chest pain.  Gastrointestinal: Negative for abdominal pain.  Genitourinary: Negative for difficulty urinating, dysuria and urgency.  Musculoskeletal: Positive for arthralgias. Negative for back pain, gait problem, joint swelling, myalgias, neck pain and neck stiffness.  Skin: Negative for rash and wound.  Neurological: Negative for dizziness, weakness, numbness and headaches.     Physical Exam Updated Vital Signs BP (!) 143/78 (BP Location: Left Arm)   Pulse 73   Temp 98.2 F (36.8 C) (Oral)   Resp 18   Ht 6' (1.829 m)   Wt 97.5 kg (215 lb)   SpO2 95%   BMI 29.16 kg/m   Physical Exam  Constitutional: He is oriented to person, place, and time. He appears well-developed and well-nourished.  HENT:  Head: Normocephalic and atraumatic.  Eyes: Pupils are equal, round, and reactive to light. Conjunctivae are normal.  Neck: Normal range of motion. Neck supple.  Cardiovascular: Normal rate.   Pulmonary/Chest: Effort normal. No respiratory distress.  Musculoskeletal: Normal range of motion.  Examination of the cervical thoracic and lumbar spine shows no spinous process tenderness. He has full range of motion of the  cervical spine with no discomfort. Patient has deformity to the right midshaft of the humerus with no skin breakdown noted. Sensation is intact. Patient is tender to palpation on the midshaft of the humerus. Radial nerve appears intact on the right upper extremity, he has full range of motion of the digits. He has pain with elbow range of motion. 2+ radial pulses 2+ cap refill right upper extremity.  Neurological: He is alert and oriented to person, place, and time. Coordination normal.  Skin: Skin is warm. No rash noted.  Psychiatric: He has a normal mood and affect. His behavior is normal. Thought content normal.  ED Treatments / Results  Labs (all labs ordered are listed, but only abnormal results are displayed) Labs Reviewed - No data to display  EKG  EKG Interpretation None       Radiology Dg Humerus Right  Result Date: 08/19/2017 CLINICAL DATA:  Acute right arm pain following fall today. Initial encounter. EXAM: RIGHT HUMERUS - 2+ VIEW COMPARISON:  None. FINDINGS: A minimally comminuted transverse fracture the mid humerus is noted with apex lateral angulation and 1.5 cm distraction. No subluxation or dislocation. IMPRESSION: Mid humeral fracture as described. Electronically Signed   By: Harmon Pier M.D.   On: 08/19/2017 19:57    Procedures Procedures (including critical care time) SPLINT APPLICATION Date/Time: 8:51 PM Authorized by: Patience Musca Consent: Verbal consent obtained. Risks and benefits: risks, benefits and alternatives were discussed Consent given by: patient Splint applied by: physician assistant Location details: Right humerus  Splint type: Coaptation splint  Supplies used: Ortho-Glass, ace wrap. Splint, cast padding Post-procedure: The splinted body part was neurovascularly unchanged following the procedure. Patient tolerance: Patient tolerated the procedure well with no immediate complications.     Medications Ordered in  ED Medications  oxyCODONE-acetaminophen (PERCOCET/ROXICET) 5-325 MG per tablet 1 tablet (1 tablet Oral Given 08/19/17 1923)     Initial Impression / Assessment and Plan / ED Course  I have reviewed the triage vital signs and the nursing notes.  Pertinent labs & imaging results that were available during my care of the patient were reviewed by me and considered in my medical decision making (see chart for details).     66 year old male with transverse mid shaft right humerus fracture with displacement. Patient's place into a coaptation splint with sling. He will follow-up with orthopedics. Today's examination shows no radial nerve injury. Patient is educated on signs and symptoms to return to the emergency department for.  Final Clinical Impressions(s) / ED Diagnoses   Final diagnoses:  Closed displaced transverse fracture of shaft of right humerus, initial encounter    New Prescriptions New Prescriptions   OXYCODONE (ROXICODONE) 5 MG IMMEDIATE RELEASE TABLET    Take 1 tablet (5 mg total) by mouth every 8 (eight) hours as needed.     Ronnette Juniper 08/19/17 2053    Emily Filbert, MD 08/19/17 2158

## 2017-08-22 ENCOUNTER — Encounter (INDEPENDENT_AMBULATORY_CARE_PROVIDER_SITE_OTHER): Payer: Self-pay | Admitting: Orthopaedic Surgery

## 2017-08-22 ENCOUNTER — Ambulatory Visit (INDEPENDENT_AMBULATORY_CARE_PROVIDER_SITE_OTHER): Payer: Commercial Managed Care - PPO | Admitting: Orthopaedic Surgery

## 2017-08-22 DIAGNOSIS — S42301A Unspecified fracture of shaft of humerus, right arm, initial encounter for closed fracture: Secondary | ICD-10-CM | POA: Diagnosis not present

## 2017-08-22 DIAGNOSIS — S42301S Unspecified fracture of shaft of humerus, right arm, sequela: Secondary | ICD-10-CM | POA: Insufficient documentation

## 2017-08-22 NOTE — Progress Notes (Addendum)
Office Visit Note   Patient: Jonathon Middleton           Date of Birth: 04-13-1951           MRN: [id number] Visit Date: 08/22/2017              Requested by: Eustaquio Boyden, MD [address] [place], [place] [address] PCP: Eustaquio Boyden, MD   Assessment & Plan: Visit Diagnoses:  1. Closed fracture of shaft of right humerus, initial encounter     Plan: Overall impression is right transverse humeral shaft fracture with greater than 30 of angulation. These x-rays were reviewed with the patient and recommendations are for ORIF. We discussed the risks, benefits, and alternatives to surgery and he agrees to proceed.  He is to stop taking his eliquis.  He may continue to take amiodarone. Surgery scheduled for Friday. Total face to face encounter time was greater than 45 minutes and over half of this time was spent in counseling and/or coordination of care.  Follow-Up Instructions: Return for 2 week postop visit.   Orders:  No orders of the defined types were placed in this encounter.  No orders of the defined types were placed in this encounter.     Procedures: No procedures performed   Clinical Data: No additional findings.   Subjective: Chief Complaint  Patient presents with  . Right Upper Arm - Pain, Fracture    Patient is a 66 year old right-hand dominant gentleman who fell off a ladder about 3 days ago and sustained a right humeral shaft fracture. He was evaluated in the ER and placed in a splint and given follow-up today. He endorses swelling bruising and pain. Denies any numbness and tingling. Pain is worse with movement of the arm.    Review of Systems  Constitutional: Negative.   All other systems reviewed and are negative.    Objective: Vital Signs: There were no vitals taken for this visit.  Physical Exam  Constitutional: He is oriented to person, place, and time. He appears well-developed and well-nourished.  HENT:  Head: Normocephalic  and atraumatic.  Eyes: Pupils are equal, round, and reactive to light.  Neck: Neck supple.  Pulmonary/Chest: Effort normal.  Abdominal: Soft.  Musculoskeletal: Normal range of motion.  Neurological: He is alert and oriented to person, place, and time.  Skin: Skin is warm.  Psychiatric: He has a normal mood and affect. His behavior is normal. Judgment and thought content normal.  Nursing note and vitals reviewed.   Ortho Exam Right upper extremity exam shows significant bruising and swelling of the upper arm. Radial nerve is intact. Hand is warm well-perfused. Specialty Comments:  No specialty comments available.  Imaging: No results found.   PMFS History: Patient Active Problem List   Diagnosis Date Noted  . Closed fracture of shaft of right humerus, initial encounter 08/22/2017  . Elevated PSA 06/27/2016  . Adjustment disorder with depressed mood 06/27/2016  . Health maintenance examination 04/17/2014  . Obesity, Class I, BMI 30-34.9 04/17/2014  . Nonalcoholic fatty liver disease   . GERD (gastroesophageal reflux disease)   . Paroxysmal atrial fibrillation (HCC) 09/16/2013  . Hyperlipidemia 09/16/2013  . Essential hypertension 09/16/2013  . Smoking history 09/16/2013  . MDD (major depressive disorder), recurrent episode (HCC) 09/16/2013  . Diabetes mellitus type 2, controlled, without complications (HCC) 09/16/2013   Past Medical History:  Diagnosis Date  . Atrial fibrillation (HCC)   . Depression with anxiety   . GERD (gastroesophageal reflux disease)   .  History of chicken pox   . Hyperlipidemia   . Hypertension   . Nonalcoholic fatty liver disease    ?h/o elevated LFTs, has not had [place]  . Prediabetes    treated with diet & exercise. saw [facility] nutritionist 04/2014    Family History  Problem Relation Age of Onset  . Hypertension Mother   . CAD Father 65       massive MI, deceased  . CAD Paternal Uncle 33       MI, smoker  . Stroke Paternal Aunt   . Diabetes  Paternal Grandmother   . Cancer Maternal Uncle        lung cancer (non small cell), smoker    Past Surgical History:  Procedure Laterality Date  . KNEE SURGERY Right 2009   x 2 (Dr. Ernest Pine)   Social History   Occupational History  . Not on file.   Social History Main Topics  . Smoking status: Former Smoker    Packs/day: 1.00    Years: 5.00    Types: Cigars    Quit date: 04/04/1983  . Smokeless tobacco: Never Used  . Alcohol use 1.2 oz/week    2 Glasses of wine per week     Comment: regular 2 glasses wine/day 4-5ozper week  . Drug use: No  . Sexual activity: Not on file

## 2017-08-24 ENCOUNTER — Encounter (HOSPITAL_COMMUNITY): Payer: Self-pay | Admitting: *Deleted

## 2017-08-24 NOTE — Progress Notes (Addendum)
Jonathon Middleton denies chest pain, lightheadedness,or shortness or breath.  Jonathon Middleton reports that CBG's run high 9's yo low 100's.  Patient reports that the checks CBG every am.   Patien t has metabolic syndrome and takes Metformin prevently.  I instructed patient to check CBG after awaking and every 2 hours until arrival  to the hospital.  I Instructed patient if CBG is less than 70 to take1/2 cup of a clear juice. Recheck CBG in 15 minutes then call pre- op desk at [phone] for further instructions.   I called Sherri at [facility] and asked if they spoke with patients cardiologist about stopping Eliquis, I asked her to call me back with the information.

## 2017-08-25 ENCOUNTER — Ambulatory Visit (HOSPITAL_COMMUNITY): Payer: Commercial Managed Care - PPO

## 2017-08-25 ENCOUNTER — Encounter (HOSPITAL_COMMUNITY): Payer: Self-pay | Admitting: Urology

## 2017-08-25 ENCOUNTER — Ambulatory Visit (HOSPITAL_COMMUNITY)
Admission: RE | Admit: 2017-08-25 | Discharge: 2017-08-25 | Disposition: A | Discharge status: Home / Self Care | Payer: Commercial Managed Care - PPO | Source: Ambulatory Visit | Attending: Orthopaedic Surgery | Admitting: Orthopaedic Surgery

## 2017-08-25 ENCOUNTER — Ambulatory Visit (HOSPITAL_COMMUNITY): Payer: Commercial Managed Care - PPO | Admitting: Anesthesiology

## 2017-08-25 ENCOUNTER — Encounter (HOSPITAL_COMMUNITY)
Admission: RE | Disposition: A | Discharge status: Home / Self Care | Payer: Self-pay | Source: Ambulatory Visit | Attending: Orthopaedic Surgery

## 2017-08-25 DIAGNOSIS — R7303 Prediabetes: Secondary | ICD-10-CM | POA: Insufficient documentation

## 2017-08-25 DIAGNOSIS — Z87891 Personal history of nicotine dependence: Secondary | ICD-10-CM | POA: Diagnosis not present

## 2017-08-25 DIAGNOSIS — I4891 Unspecified atrial fibrillation: Secondary | ICD-10-CM | POA: Diagnosis not present

## 2017-08-25 DIAGNOSIS — S42301A Unspecified fracture of shaft of humerus, right arm, initial encounter for closed fracture: Secondary | ICD-10-CM

## 2017-08-25 DIAGNOSIS — Y92008 Other place in unspecified non-institutional (private) residence as the place of occurrence of the external cause: Secondary | ICD-10-CM | POA: Insufficient documentation

## 2017-08-25 DIAGNOSIS — F418 Other specified anxiety disorders: Secondary | ICD-10-CM | POA: Insufficient documentation

## 2017-08-25 DIAGNOSIS — Z7984 Long term (current) use of oral hypoglycemic drugs: Secondary | ICD-10-CM | POA: Diagnosis not present

## 2017-08-25 DIAGNOSIS — Z419 Encounter for procedure for purposes other than remedying health state, unspecified: Secondary | ICD-10-CM

## 2017-08-25 DIAGNOSIS — Z79899 Other long term (current) drug therapy: Secondary | ICD-10-CM | POA: Insufficient documentation

## 2017-08-25 DIAGNOSIS — I10 Essential (primary) hypertension: Secondary | ICD-10-CM | POA: Diagnosis not present

## 2017-08-25 DIAGNOSIS — K219 Gastro-esophageal reflux disease without esophagitis: Secondary | ICD-10-CM | POA: Insufficient documentation

## 2017-08-25 DIAGNOSIS — E785 Hyperlipidemia, unspecified: Secondary | ICD-10-CM | POA: Insufficient documentation

## 2017-08-25 DIAGNOSIS — W11XXXA Fall on and from ladder, initial encounter: Secondary | ICD-10-CM | POA: Insufficient documentation

## 2017-08-25 DIAGNOSIS — Z7902 Long term (current) use of antithrombotics/antiplatelets: Secondary | ICD-10-CM | POA: Diagnosis not present

## 2017-08-25 DIAGNOSIS — Z9889 Other specified postprocedural states: Secondary | ICD-10-CM

## 2017-08-25 HISTORY — PX: ORIF HUMERUS FRACTURE: SHX2126

## 2017-08-25 HISTORY — DX: Prediabetes: R73.03

## 2017-08-25 HISTORY — DX: Unspecified osteoarthritis, unspecified site: M19.90

## 2017-08-25 HISTORY — DX: Depression, unspecified: F32.A

## 2017-08-25 HISTORY — DX: Major depressive disorder, single episode, unspecified: F32.9

## 2017-08-25 LAB — COMPREHENSIVE METABOLIC PANEL
ALBUMIN: 4 g/dL (ref 3.5–5.0)
ALT: 36 U/L (ref 17–63)
ANION GAP: 7 (ref 5–15)
AST: 37 U/L (ref 15–41)
Alkaline Phosphatase: 142 U/L — ABNORMAL HIGH (ref 38–126)
BILIRUBIN TOTAL: 1.1 mg/dL (ref 0.3–1.2)
BUN: 17 mg/dL (ref 6–20)
CO2: 28 mmol/L (ref 22–32)
Calcium: 9.1 mg/dL (ref 8.9–10.3)
Chloride: 105 mmol/L (ref 101–111)
Creatinine, Ser: 1.15 mg/dL (ref 0.61–1.24)
GFR calc Af Amer: >60 mL/min (ref >60)
Glucose, Bld: 104 mg/dL — ABNORMAL HIGH (ref 65–99)
POTASSIUM: 5.1 mmol/L (ref 3.5–5.1)
Sodium: 140 mmol/L (ref 135–145)
TOTAL PROTEIN: 7 g/dL (ref 6.5–8.1)

## 2017-08-25 LAB — GLUCOSE, CAPILLARY
GLUCOSE-CAPILLARY: 108 mg/dL — AB (ref 65–99)
GLUCOSE-CAPILLARY: 94 mg/dL (ref 65–99)
Glucose-Capillary: 94 mg/dL (ref 65–99)

## 2017-08-25 LAB — CBC
HEMATOCRIT: 41.7 % (ref 39.0–52.0)
Hemoglobin: 14.1 g/dL (ref 13.0–17.0)
MCH: 30.3 pg (ref 26.0–34.0)
MCHC: 33.8 g/dL (ref 30.0–36.0)
MCV: 89.5 fL (ref 78.0–100.0)
Platelets: 205 10*3/uL (ref 150–400)
RBC: 4.66 MIL/uL (ref 4.22–5.81)
RDW: 14.6 % (ref 11.5–15.5)
WBC: 5.1 10*3/uL (ref 4.0–10.5)

## 2017-08-25 LAB — PROTIME-INR
INR: 0.99
PROTHROMBIN TIME: 13 s (ref 11.4–15.2)

## 2017-08-25 LAB — HEMOGLOBIN A1C
HEMOGLOBIN A1C: 6.1 % — AB (ref 4.8–5.6)
Mean Plasma Glucose: 128.37 mg/dL

## 2017-08-25 SURGERY — OPEN REDUCTION INTERNAL FIXATION (ORIF) HUMERAL SHAFT FRACTURE
Anesthesia: General | Site: Arm Upper | Laterality: Right

## 2017-08-25 MED ORDER — OXYCODONE HCL 5 MG PO TABS: 5.0000 mg | ORAL_TABLET | ORAL | 0 refills | Status: DC | PRN

## 2017-08-25 MED ORDER — PHENYLEPHRINE HCL 10 MG/ML IJ SOLN
INTRAVENOUS | Status: DC | PRN
  Administered 2017-08-25: 25 ug/min via INTRAVENOUS

## 2017-08-25 MED ORDER — SENNOSIDES-DOCUSATE SODIUM 8.6-50 MG PO TABS: 1.0000 | ORAL_TABLET | Freq: Every evening | ORAL | 1 refills | Status: DC | PRN

## 2017-08-25 MED ORDER — CEFAZOLIN SODIUM-DEXTROSE 2-3 GM-% IV SOLR
2.0000 g | Freq: Once | INTRAVENOUS | Status: AC
  Administered 2017-08-25: 2 g via INTRAVENOUS
  Filled 2017-08-25: qty 50

## 2017-08-25 MED ORDER — SUGAMMADEX SODIUM 200 MG/2ML IV SOLN
INTRAVENOUS | Status: AC
  Filled 2017-08-25: qty 2

## 2017-08-25 MED ORDER — MIDAZOLAM HCL 2 MG/2ML IJ SOLN
1.0000 mg | Freq: Once | INTRAMUSCULAR | Status: AC
  Administered 2017-08-25: 1 mg via INTRAVENOUS

## 2017-08-25 MED ORDER — FENTANYL CITRATE (PF) 250 MCG/5ML IJ SOLN
INTRAMUSCULAR | Status: AC
  Filled 2017-08-25: qty 5

## 2017-08-25 MED ORDER — HYDROMORPHONE HCL 1 MG/ML IJ SOLN: 0.2500 mg | INTRAMUSCULAR | Status: DC | PRN

## 2017-08-25 MED ORDER — PROMETHAZINE HCL 25 MG PO TABS: 25.0000 mg | ORAL_TABLET | Freq: Four times a day (QID) | ORAL | 1 refills | Status: DC | PRN

## 2017-08-25 MED ORDER — FENTANYL CITRATE (PF) 100 MCG/2ML IJ SOLN
INTRAMUSCULAR | Status: AC
  Administered 2017-08-25: 100 ug via INTRAVENOUS
  Filled 2017-08-25: qty 2

## 2017-08-25 MED ORDER — ONDANSETRON HCL 4 MG/2ML IJ SOLN
INTRAMUSCULAR | Status: AC
  Filled 2017-08-25: qty 2

## 2017-08-25 MED ORDER — 0.9 % SODIUM CHLORIDE (POUR BTL) OPTIME
TOPICAL | Status: DC | PRN
  Administered 2017-08-25: 1000 mL

## 2017-08-25 MED ORDER — ONDANSETRON HCL 4 MG/2ML IJ SOLN
INTRAMUSCULAR | Status: DC | PRN
  Administered 2017-08-25: 4 mg via INTRAVENOUS

## 2017-08-25 MED ORDER — FENTANYL CITRATE (PF) 100 MCG/2ML IJ SOLN
100.0000 ug | Freq: Once | INTRAMUSCULAR | Status: AC
  Administered 2017-08-25: 100 ug via INTRAVENOUS

## 2017-08-25 MED ORDER — PHENYLEPHRINE 40 MCG/ML (10ML) SYRINGE FOR IV PUSH (FOR BLOOD PRESSURE SUPPORT)
PREFILLED_SYRINGE | INTRAVENOUS | Status: AC
  Filled 2017-08-25: qty 10

## 2017-08-25 MED ORDER — ROCURONIUM BROMIDE 10 MG/ML (PF) SYRINGE
PREFILLED_SYRINGE | INTRAVENOUS | Status: AC
  Filled 2017-08-25: qty 5

## 2017-08-25 MED ORDER — CEFAZOLIN SODIUM-DEXTROSE 2-4 GM/100ML-% IV SOLN
INTRAVENOUS | Status: AC
  Filled 2017-08-25: qty 100

## 2017-08-25 MED ORDER — PHENYLEPHRINE HCL 10 MG/ML IJ SOLN
INTRAMUSCULAR | Status: AC
  Filled 2017-08-25: qty 1

## 2017-08-25 MED ORDER — MIDAZOLAM HCL 2 MG/2ML IJ SOLN
INTRAMUSCULAR | Status: AC
  Administered 2017-08-25: 1 mg via INTRAVENOUS
  Filled 2017-08-25: qty 2

## 2017-08-25 MED ORDER — DEXAMETHASONE SODIUM PHOSPHATE 10 MG/ML IJ SOLN
INTRAMUSCULAR | Status: AC
  Filled 2017-08-25: qty 1

## 2017-08-25 MED ORDER — ROCURONIUM BROMIDE 10 MG/ML (PF) SYRINGE
PREFILLED_SYRINGE | INTRAVENOUS | Status: DC | PRN
  Administered 2017-08-25: 50 mg via INTRAVENOUS

## 2017-08-25 MED ORDER — SUGAMMADEX SODIUM 200 MG/2ML IV SOLN
INTRAVENOUS | Status: DC | PRN
  Administered 2017-08-25: 200 mg via INTRAVENOUS

## 2017-08-25 MED ORDER — LIDOCAINE 2% (20 MG/ML) 5 ML SYRINGE
INTRAMUSCULAR | Status: AC
  Filled 2017-08-25: qty 5

## 2017-08-25 MED ORDER — METHOCARBAMOL 750 MG PO TABS: 750.0000 mg | ORAL_TABLET | Freq: Two times a day (BID) | ORAL | 0 refills | Status: DC | PRN

## 2017-08-25 MED ORDER — ONDANSETRON HCL 4 MG PO TABS: 4.0000 mg | ORAL_TABLET | Freq: Three times a day (TID) | ORAL | 0 refills | Status: DC | PRN

## 2017-08-25 MED ORDER — OXYCODONE HCL ER 10 MG PO T12A: 10.0000 mg | EXTENDED_RELEASE_TABLET | Freq: Two times a day (BID) | ORAL | 0 refills | Status: DC

## 2017-08-25 MED ORDER — ZINC SULFATE 220 (50 ZN) MG PO CAPS: 220.0000 mg | ORAL_CAPSULE | Freq: Every day | ORAL | 0 refills | Status: DC

## 2017-08-25 MED ORDER — PHENYLEPHRINE 40 MCG/ML (10ML) SYRINGE FOR IV PUSH (FOR BLOOD PRESSURE SUPPORT)
PREFILLED_SYRINGE | INTRAVENOUS | Status: DC | PRN
  Administered 2017-08-25: 80 ug via INTRAVENOUS
  Administered 2017-08-25: 40 ug via INTRAVENOUS

## 2017-08-25 MED ORDER — DEXAMETHASONE SODIUM PHOSPHATE 10 MG/ML IJ SOLN
INTRAMUSCULAR | Status: DC | PRN
  Administered 2017-08-25: 10 mg via INTRAVENOUS

## 2017-08-25 MED ORDER — LIDOCAINE 2% (20 MG/ML) 5 ML SYRINGE
INTRAMUSCULAR | Status: DC | PRN
  Administered 2017-08-25: 40 mg via INTRAVENOUS

## 2017-08-25 MED ORDER — LACTATED RINGERS IV SOLN
INTRAVENOUS | Status: DC
  Administered 2017-08-25 (×2): via INTRAVENOUS

## 2017-08-25 MED ORDER — CALCIUM CARBONATE-VITAMIN D 500-200 MG-UNIT PO TABS: 1.0000 | ORAL_TABLET | Freq: Three times a day (TID) | ORAL | 12 refills | Status: DC

## 2017-08-25 MED ORDER — PROPOFOL 10 MG/ML IV BOLUS
INTRAVENOUS | Status: DC | PRN
  Administered 2017-08-25: 40 mg via INTRAVENOUS
  Administered 2017-08-25: 130 mg via INTRAVENOUS

## 2017-08-25 SURGICAL SUPPLY — 56 items
BIT DRILL 2.5X110 QC LCP DISP (BIT) ×2 IMPLANT
BLADE SURG 10 STRL SS (BLADE) ×2 IMPLANT
BNDG ESMARK 4X9 LF (GAUZE/BANDAGES/DRESSINGS) IMPLANT
COVER SURGICAL LIGHT HANDLE (MISCELLANEOUS) ×2 IMPLANT
CUFF TOURNIQUET SINGLE 18IN (TOURNIQUET CUFF) ×2 IMPLANT
CUFF TOURNIQUET SINGLE 24IN (TOURNIQUET CUFF) IMPLANT
DRAPE C-ARM 42X72 X-RAY (DRAPES) ×2 IMPLANT
DRAPE IMP U-DRAPE 54X76 (DRAPES) IMPLANT
DRAPE INCISE IOBAN 66X45 STRL (DRAPES) ×2 IMPLANT
DRAPE U-SHAPE 47X51 STRL (DRAPES) ×2 IMPLANT
DRESSING AQUACEL AG SP 3.5X10 (GAUZE/BANDAGES/DRESSINGS) ×1 IMPLANT
DRSG AQUACEL AG ADV 3.5X10 (GAUZE/BANDAGES/DRESSINGS) ×2 IMPLANT
DRSG AQUACEL AG SP 3.5X10 (GAUZE/BANDAGES/DRESSINGS) ×2
DRSG TEGADERM 4X4.75 (GAUZE/BANDAGES/DRESSINGS) ×8 IMPLANT
ELECT CAUTERY BLADE 6.4 (BLADE) ×2 IMPLANT
ELECT REM PT RETURN 9FT ADLT (ELECTROSURGICAL) ×2
ELECTRODE REM PT RTRN 9FT ADLT (ELECTROSURGICAL) ×1 IMPLANT
FACESHIELD WRAPAROUND (MASK) ×2 IMPLANT
GAUZE SPONGE 4X4 12PLY STRL (GAUZE/BANDAGES/DRESSINGS) IMPLANT
GAUZE XEROFORM 5X9 LF (GAUZE/BANDAGES/DRESSINGS) IMPLANT
GLOVE SKINSENSE NS SZ7.5 (GLOVE) ×2
GLOVE SKINSENSE STRL SZ7.5 (GLOVE) ×2 IMPLANT
GOWN STRL REIN XL XLG (GOWN DISPOSABLE) ×6 IMPLANT
KIT BASIN OR (CUSTOM PROCEDURE TRAY) ×2 IMPLANT
KIT ROOM TURNOVER OR (KITS) ×2 IMPLANT
MANIFOLD NEPTUNE II (INSTRUMENTS) ×2 IMPLANT
NS IRRIG 1000ML POUR BTL (IV SOLUTION) ×2 IMPLANT
PACK SHOULDER (CUSTOM PROCEDURE TRAY) ×2 IMPLANT
PACK UNIVERSAL I (CUSTOM PROCEDURE TRAY) IMPLANT
PAD ARMBOARD 7.5X6 YLW CONV (MISCELLANEOUS) ×4 IMPLANT
PAD CAST 4YDX4 CTTN HI CHSV (CAST SUPPLIES) IMPLANT
PADDING CAST COTTON 4X4 STRL (CAST SUPPLIES)
PROS LCP PLATE 10 137M (Plate) ×2 IMPLANT
PROSTHESIS LCP PLATE 10 137M (Plate) ×1 IMPLANT
SCREW CORTEX 3.5 24MM (Screw) ×1 IMPLANT
SCREW CORTEX 3.5 26MM (Screw) ×2 IMPLANT
SCREW CORTEX 3.5 28MM (Screw) ×6 IMPLANT
SCREW LOCK CORT ST 3.5X24 (Screw) ×1 IMPLANT
SCREW LOCK CORT ST 3.5X26 (Screw) ×2 IMPLANT
SCREW LOCK CORT ST 3.5X28 (Screw) ×6 IMPLANT
SLING ARM FOAM STRAP LRG (SOFTGOODS) ×2 IMPLANT
SLING ARM IMMOBILIZER LRG (SOFTGOODS) ×2 IMPLANT
SPONGE LAP 18X18 X RAY DECT (DISPOSABLE) ×2 IMPLANT
STAPLER VISISTAT 35W (STAPLE) IMPLANT
SUCTION FRAZIER HANDLE 10FR (MISCELLANEOUS)
SUCTION TUBE FRAZIER 10FR DISP (MISCELLANEOUS) IMPLANT
SUT ETHILON 3 0 PS 1 (SUTURE) ×4 IMPLANT
SUT VIC AB 0 CT1 27 (SUTURE) ×2
SUT VIC AB 0 CT1 27XBRD ANBCTR (SUTURE) ×2 IMPLANT
SUT VIC AB 2-0 CT1 27 (SUTURE) ×2
SUT VIC AB 2-0 CT1 TAPERPNT 27 (SUTURE) ×2 IMPLANT
SYR CONTROL 10ML LL (SYRINGE) IMPLANT
TOWEL OR 17X24 6PK STRL BLUE (TOWEL DISPOSABLE) IMPLANT
TOWEL OR 17X26 10 PK STRL BLUE (TOWEL DISPOSABLE) ×2 IMPLANT
UNDERPAD 30X30 (UNDERPADS AND DIAPERS) ×2 IMPLANT
WATER STERILE IRR 1000ML POUR (IV SOLUTION) IMPLANT

## 2017-08-25 NOTE — Anesthesia Procedure Notes (Signed)
Procedure Name: Intubation Date/Time: 08/25/2017 3:10 PM Performed by: Lovie Chol Pre-anesthesia Checklist: Patient identified, Emergency Drugs available, Suction available and Patient being monitored Patient Re-evaluated:Patient Re-evaluated prior to induction Oxygen Delivery Method: Circle System Utilized Preoxygenation: Pre-oxygenation with 100% oxygen Induction Type: IV induction Ventilation: Mask ventilation without difficulty Laryngoscope Size: Miller and 3 Grade View: Grade I Tube type: Oral Tube size: 7.5 mm Number of attempts: 1 Airway Equipment and Method: Stylet and Oral airway Placement Confirmation: ETT inserted through vocal cords under direct vision,  positive ETCO2 and breath sounds checked- equal and bilateral Secured at: 22 cm Tube secured with: Tape Dental Injury: Teeth and Oropharynx as per pre-operative assessment

## 2017-08-25 NOTE — Anesthesia Preprocedure Evaluation (Addendum)
Anesthesia Evaluation  Patient identified by MRN, date of birth, ID band Patient awake    Reviewed: Allergy & Precautions, H&P , NPO status , Patient's Chart, lab work & pertinent test results, reviewed documented beta blocker date and time   Airway Mallampati: III  TM Distance: >3 FB Neck ROM: Full    Dental no notable dental hx. (+) Teeth Intact, Dental Advisory Given   Pulmonary neg pulmonary ROS, former smoker,    Pulmonary exam normal breath sounds clear to auscultation       Cardiovascular hypertension, Pt. on medications and Pt. on home beta blockers + dysrhythmias Atrial Fibrillation  Rhythm:Regular Rate:Normal     Neuro/Psych Depression negative neurological ROS  negative psych ROS   GI/Hepatic Neg liver ROS, GERD  Medicated and Controlled,  Endo/Other  diabetes, Type 2, Oral Hypoglycemic AgentsPatient stated he was instructed to take his daily dose of glucophage this am.   CBG  = 94  Renal/GU negative Renal ROS  negative genitourinary   Musculoskeletal  (+) Arthritis , Osteoarthritis,    Abdominal   Peds  Hematology negative hematology ROS (+)   Anesthesia Other Findings   Reproductive/Obstetrics negative OB ROS                           Anesthesia Physical Anesthesia Plan  ASA: III  Anesthesia Plan: General   Post-op Pain Management:  Regional for Post-op pain   Induction: Intravenous  PONV Risk Score and Plan: 2 and Ondansetron, Dexamethasone and Midazolam  Airway Management Planned: Oral ETT  Additional Equipment:   Intra-op Plan:   Post-operative Plan: Extubation in OR  Informed Consent: I have reviewed the patients History and Physical, chart, labs and discussed the procedure including the risks, benefits and alternatives for the proposed anesthesia with the patient or authorized representative who has indicated his/her understanding and acceptance.    Dental advisory given  Plan Discussed with: CRNA  Anesthesia Plan Comments:         Anesthesia Quick Evaluation

## 2017-08-25 NOTE — Transfer of Care (Signed)
Immediate Anesthesia Transfer of Care Note  Patient: Jonathon Middleton  Procedure(s) Performed: Procedure(s): OPEN REDUCTION INTERNAL FIXATION (ORIF) RIGHT HUMERAL SHAFT FRACTURE (Right)  Patient Location: PACU  Anesthesia Type:GA combined with regional for post-op pain  Level of Consciousness: awake, alert  and oriented  Airway & Oxygen Therapy: Patient Spontanous Breathing and Patient connected to face mask oxygen  Post-op Assessment: Report given to RN, Post -op Vital signs reviewed and stable and Patient moving all extremities  Post vital signs: Reviewed and stable  Last Vitals:  Vitals:   08/25/17 1330 08/25/17 1335  BP: (!) 161/81 (!) 177/76  Pulse: 69 73  Resp: 11 15  Temp:    SpO2: 93% 94%    Last Pain:  Vitals:   08/25/17 1211  TempSrc: Oral      Patients Stated Pain Goal: 4 (08/25/17 1240)  Complications: No apparent anesthesia complications

## 2017-08-25 NOTE — H&P (Signed)
PREOPERATIVE H&P  Chief Complaint: right humeral shaft fracture  HPI: Jonathon Middleton is a 66 y.o. male who presents for surgical treatment of right humeral shaft fracture.  He denies any changes in medical history.  Past Medical History:  Diagnosis Date  . Arthritis   . Atrial fibrillation (HCC)   . Depression   . Depression with anxiety   . GERD (gastroesophageal reflux disease)    08/24/18- not current  . History of chicken pox   . Hyperlipidemia   . Hypertension   . Nonalcoholic fatty liver disease    ?h/o elevated LFTs, has not had [place]  . Pre-diabetes    Metobolic Syndrome  . Prediabetes    treated with diet & exercise. saw [facility] nutritionist 04/2014   Past Surgical History:  Procedure Laterality Date  . COLONOSCOPY    . KNEE SURGERY Right 2009   x 2 (Dr. Ernest Pine)  Arthroscopy- torn MCL   Social History   Social History  . Marital status: Married    Spouse name: N/A  . Number of children: N/A  . Years of education: N/A   Social History Main Topics  . Smoking status: Former Smoker    Packs/day: 1.00    Years: 5.00    Types: Cigars  . Smokeless tobacco: Never Used     Comment: ocassional last time 03/2017  . Alcohol use 1.2 oz/week    2 Cans of beer per week     Comment: regular 2 glasses wine/day 4-5ozper week  . Drug use: No  . Sexual activity: Not Asked   Other Topics Concern  . 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 Hypertension Mother   . CAD Father 61       massive MI, deceased  . CAD Paternal Uncle 37       MI, smoker  . Stroke Paternal Aunt   . Diabetes Paternal Grandmother   . Cancer Maternal Uncle        lung cancer (non small cell), smoker   No Known  Allergies Prior to Admission medications   Medication Sig Start Date End Date Taking? Authorizing Provider  amiodarone (PACERONE) 200 MG tablet Take 1 tablet (200 mg total) by mouth daily. 03/16/17  Yes Antonieta Iba, MD  atorvastatin (LIPITOR) 40 MG tablet TAKE 1 TABLET BY MOUTH AT  BEDTIME 05/19/17  Yes Eustaquio Boyden, MD  Cholecalciferol (VITAMIN D) 2000 units CAPS Take 2,000 Units by mouth 2 (two) times daily.    Yes [provider]  Coenzyme Q10 (CO Q-10) 100 MG CAPS Take 100 mg by mouth daily with breakfast.    Yes [provider]  diltiazem (CARDIZEM CD) 120 MG 24 hr capsule Take 1 capsule (120 mg total) by mouth daily. Patient taking differently: Take 120 mg by mouth at bedtime.  08/19/16 08/23/18 Yes Gollan, Tollie Pizza, MD  ibuprofen (ADVIL,MOTRIN) 200 MG tablet Take 400 mg by mouth every 8 (eight) hours as needed (for pain.).   Yes [provider]  Melatonin 10 MG TABS Take 10 mg by mouth at bedtime.    Yes [provider]  metFORMIN (GLUCOPHAGE) 500 MG tablet TAKE 1 TABLET BY MOUTH  DAILY  WITH BREAKFAST 05/19/17  Yes Eustaquio Boyden, MD  metoprolol succinate (TOPROL-XL) 50 MG 24 hr tablet Take 1 tablet (50 mg total) by mouth daily. NEEDS OFFICE VISIT Patient taking differently: Take 50 mg by mouth at bedtime. NEEDS OFFICE VISIT 06/19/17  Yes Eustaquio Boyden, MD  Multiple Vitamin (MULTIVITAMIN WITH MINERALS) TABS tablet Take 1 tablet by mouth daily with breakfast.   Yes [provider]  omega-3 fish oil (MAXEPA) 1000 MG CAPS capsule Take 1 capsule by mouth 2 (two) times daily.    Yes [provider]  oxyCODONE (ROXICODONE) 5 MG immediate release tablet Take 1 tablet (5 mg total) by mouth every 8 (eight) hours as needed. Patient taking differently: Take 5 mg by mouth every 8 (eight) hours as needed (for pain.).  08/19/17 08/19/18 Yes Evon Slack, PA-C  sertraline (ZOLOFT) 50 MG tablet TAKE 1 TABLET BY MOUTH  DAILY Patient taking  differently: TAKE 1 TABLET BY MOUTH  DAILY AT BEDTIME. 05/19/17  Yes Eustaquio Boyden, MD  apixaban (ELIQUIS) 5 MG TABS tablet Take 1 tablet (5 mg total) by mouth 2 (two) times daily. 08/19/16   Antonieta Iba, MD  glucose blood (ONE TOUCH ULTRA TEST) test strip Use to check sugar once daily and as needed. Dx: E11.9 03/20/17   Eustaquio Boyden, MD  Lancets [facility] ULTRASOFT) lancets USE AS INSTRUCTED 10/06/14   Eustaquio Boyden, MD     Positive ROS: All other systems have been reviewed and were otherwise negative with the exception of those mentioned in the HPI and as above.  Physical Exam: General: Alert, no acute distress Cardiovascular: No pedal edema Respiratory: No cyanosis, no use of accessory musculature GI: abdomen soft Skin: No lesions in the area of chief complaint Neurologic: Sensation intact distally Psychiatric: Patient is competent for consent with normal mood and affect Lymphatic: no lymphedema  MUSCULOSKELETAL: exam stable  Assessment: right humeral shaft fracture  Plan: Plan for Procedure(s): OPEN REDUCTION INTERNAL FIXATION (ORIF) RIGHT HUMERAL SHAFT FRACTURE  The risks benefits and alternatives were discussed with the patient including but not limited to the risks of nonoperative treatment, versus surgical intervention including infection, bleeding, nerve injury,  blood clots, cardiopulmonary complications, morbidity, mortality, among others, and they were willing to proceed.   Glee Arvin, MD   08/25/2017 2:55 PM

## 2017-08-25 NOTE — Anesthesia Postprocedure Evaluation (Signed)
Anesthesia Post Note  Patient: Jonathon Middleton  Procedure(s) Performed: Procedure(s) (LRB): OPEN REDUCTION INTERNAL FIXATION (ORIF) RIGHT HUMERAL SHAFT FRACTURE (Right)     Patient location during evaluation: PACU Anesthesia Type: General Level of consciousness: awake, awake and alert and oriented Pain management: pain level controlled Vital Signs Assessment: post-procedure vital signs reviewed and stable Respiratory status: spontaneous breathing, nonlabored ventilation and respiratory function stable Cardiovascular status: blood pressure returned to baseline Postop Assessment: no headache Anesthetic complications: no    Last Vitals:  Vitals:   08/25/17 1857 08/25/17 1900  BP: (!) 159/96 (!) 159/96  Pulse: (!) 59 60  Resp: (!) 24 (!) 21  Temp:    SpO2: 92% 91%    Last Pain:  Vitals:   08/25/17 1830  TempSrc:   PainSc: 0-No pain                 Desarie Feild COKER

## 2017-08-25 NOTE — Anesthesia Procedure Notes (Signed)
Anesthesia Regional Block: Interscalene brachial plexus block   Pre-Anesthetic Checklist: ,, timeout performed, Correct Patient, Correct Site, Correct Laterality, Correct Procedure, Correct Position, site marked, Risks and benefits discussed, pre-op evaluation,  At surgeon's request and post-op pain management  Laterality: Right  Prep: Maximum Sterile Barrier Precautions used, chloraprep       Needles:  Injection technique: Single-shot  Needle Type: Echogenic Stimulator Needle     Needle Length: 5cm  Needle Gauge: 22     Additional Needles:   Procedures:, nerve stimulator,,, ultrasound used (permanent image in chart),,,,   Nerve Stimulator or Paresthesia:  Response: Biceps response,   Additional Responses:   Narrative:  Start time: 08/25/2017 1:24 PM End time: 08/25/2017 1:34 PM Injection made incrementally with aspirations every 5 mL. Anesthesiologist: Gaynelle Adu  Additional Notes: 2% Lidocaine skin wheel.

## 2017-08-25 NOTE — Op Note (Signed)
   Date of Surgery: 08/25/2017  INDICATIONS: Mr. Sermon is a 66 y.o.-year-old male with a right displaced humeral shaft fracture;  The patient did consent to the procedure after discussion of the risks and benefits.  PREOPERATIVE DIAGNOSIS: Right humeral shaft fracture  POSTOPERATIVE DIAGNOSIS: Same.  PROCEDURE: 1. Open reduction internal fixation of right humeral shaft fracture 2. Neurolysis of radial nerve  SURGEON: N. Glee Arvin, M.D.  ASSIST: Hart Carwin, RNFA.  ANESTHESIA:  general, regional  IV FLUIDS AND URINE: See anesthesia.  ESTIMATED BLOOD LOSS: 100 mL.  IMPLANTS: Synthes 10 hole LC-DCP 3.5 plate  DRAINS: none  COMPLICATIONS: None.  DESCRIPTION OF PROCEDURE: The patient was brought to the operating room and placed supine on the operating table.  The patient had been signed prior to the procedure and this was documented. The patient had the anesthesia placed by the anesthesiologist.  A time-out was performed to confirm that this was the correct patient, site, side and location. The patient did receive antibiotics prior to the incision and was re-dosed during the procedure as needed at indicated intervals.  A tourniquet was not placed.  The patient had the operative extremity prepped and draped in the standard surgical fashion.    A standard anterolateral approach to the humeral shaft was used. Blunt dissection was carried through the brachialis muscle. The radial nerve was identified in its anatomic position. Neurolysis was performed both proximally and distally and protected during the entirety of the procedure. With the radial nerve mobilized we turned our attention to the humeral shaft fracture. Organized hematoma was removed from the fracture site. Subperiosteal elevation was performed. Fracture was reduced and aligned and confirmed under fluoroscopic guidance. We then placed a 10 hole dynamic compression plate on the lateral aspect of the humeral shaft at the  appropriate position. Nonlocking screws placed in compression mode were used and placed using standard AO technique. We placed 4 nonlocking screws above and below the fracture. Each screw had excellent fixation. Final fluoroscopy was used to confirm appropriate placement of the hardware and alignment of the fracture. The wound was then thoroughly irrigated with normal saline. The wound was closed in layer fashion using 0 Vicryl, 2-0 Vicryl, 3-0 nylon. Sterile dressings were applied. Patient was placed in a shoulder sling. He tolerated the procedure well had no immediate complications.  POSTOPERATIVE PLAN: He will be nonweightbearing. He is allowed to perform range of motion of the shoulder and elbow immediately. He will be discharged home today.  Mayra Reel, MD [facility] [phone] 4:59 PM

## 2017-08-28 ENCOUNTER — Encounter (HOSPITAL_COMMUNITY): Payer: Self-pay | Admitting: Orthopaedic Surgery

## 2017-08-31 ENCOUNTER — Ambulatory Visit (INDEPENDENT_AMBULATORY_CARE_PROVIDER_SITE_OTHER): Payer: Commercial Managed Care - PPO | Admitting: Psychology

## 2017-08-31 DIAGNOSIS — F4323 Adjustment disorder with mixed anxiety and depressed mood: Secondary | ICD-10-CM | POA: Diagnosis not present

## 2017-09-08 ENCOUNTER — Ambulatory Visit (INDEPENDENT_AMBULATORY_CARE_PROVIDER_SITE_OTHER): Payer: Medicare Other | Admitting: Orthopaedic Surgery

## 2017-09-08 ENCOUNTER — Encounter (INDEPENDENT_AMBULATORY_CARE_PROVIDER_SITE_OTHER): Payer: Self-pay | Admitting: Orthopaedic Surgery

## 2017-09-08 ENCOUNTER — Ambulatory Visit (INDEPENDENT_AMBULATORY_CARE_PROVIDER_SITE_OTHER): Payer: Medicare Other

## 2017-09-08 DIAGNOSIS — S42301A Unspecified fracture of shaft of humerus, right arm, initial encounter for closed fracture: Secondary | ICD-10-CM

## 2017-09-08 NOTE — Progress Notes (Signed)
Patient is 2 weeks status post ORIF right humeral shaft fracture. He is doing very well today. He is not wearing a sling and has minimal pain.  Incision is healed without signs of infection. He is neurovascularly intact. Radial nerve is intact. He is able to reach over his head to about 150 or 60 already. X-rays show stable fixation without complications. The sutures were removed today. Continue with passive range of motion and light weightbearing less than 10 pounds. Follow-up in 4 weeks with repeat 2 view x-rays of the right humerus. Anticipate beginning physical therapy at that time.

## 2017-09-14 ENCOUNTER — Other Ambulatory Visit: Payer: Self-pay | Admitting: *Deleted

## 2017-09-14 ENCOUNTER — Other Ambulatory Visit: Payer: Self-pay | Admitting: Cardiovascular Disease

## 2017-09-14 ENCOUNTER — Other Ambulatory Visit: Payer: Self-pay

## 2017-09-14 ENCOUNTER — Ambulatory Visit (INDEPENDENT_AMBULATORY_CARE_PROVIDER_SITE_OTHER): Payer: Medicare Other | Admitting: Psychology

## 2017-09-14 DIAGNOSIS — F4323 Adjustment disorder with mixed anxiety and depressed mood: Secondary | ICD-10-CM | POA: Diagnosis not present

## 2017-09-14 MED ORDER — APIXABAN 5 MG PO TABS: 5.0000 mg | ORAL_TABLET | Freq: Two times a day (BID) | ORAL | 3 refills | Status: DC

## 2017-09-14 NOTE — Telephone Encounter (Signed)
Patient had insurance change   Now uses [employer] ID # [id number]

## 2017-09-14 NOTE — Telephone Encounter (Signed)
°*  STAT* If patient is at the pharmacy, call can be transferred to refill team.   1. Which medications need to be refilled? (please list name of each medication and dose if known)   Eliquis 5 mg po BID  Lipitor  po qhs    2. Which pharmacy/location (including street and city if local pharmacy) is medication to be sent to?  [facility] [address] NOT [facility]   3. Do they need a 30 day or 90 day supply?   90

## 2017-09-14 NOTE — Telephone Encounter (Signed)
Please review for refill. Thanks!  Dr. Sharen Hones prescribed the refill for Lipitor last.

## 2017-09-14 NOTE — Telephone Encounter (Signed)
Pt contacted office and states he will be switching from mail order to [facility]. He will provide them with his insurance information and have them send over an electronic request to have medications filled

## 2017-09-18 ENCOUNTER — Encounter (INDEPENDENT_AMBULATORY_CARE_PROVIDER_SITE_OTHER): Payer: Self-pay | Admitting: Orthopaedic Surgery

## 2017-09-18 ENCOUNTER — Ambulatory Visit (INDEPENDENT_AMBULATORY_CARE_PROVIDER_SITE_OTHER): Payer: Medicare Other | Admitting: Orthopaedic Surgery

## 2017-09-18 ENCOUNTER — Other Ambulatory Visit (INDEPENDENT_AMBULATORY_CARE_PROVIDER_SITE_OTHER): Payer: Self-pay | Admitting: Orthopaedic Surgery

## 2017-09-18 ENCOUNTER — Ambulatory Visit (INDEPENDENT_AMBULATORY_CARE_PROVIDER_SITE_OTHER): Payer: Medicare Other

## 2017-09-18 DIAGNOSIS — S42301A Unspecified fracture of shaft of humerus, right arm, initial encounter for closed fracture: Secondary | ICD-10-CM | POA: Diagnosis not present

## 2017-09-18 DIAGNOSIS — S42391D Other fracture of shaft of right humerus, subsequent encounter for fracture with routine healing: Secondary | ICD-10-CM

## 2017-09-18 NOTE — Progress Notes (Signed)
Patient follows up today approximately 24 days status post ORIF of a right humeral shaft fracture. He was picking up trees and sticks over the weekend and he tried to pick up a heavy stick which was more like a small tree when it spun and essentially levered his arm and fracture of the entire arm and he felt an acute pop in his arm. He denies any significant pain. Denies any numbness and tingling.  Physical exam shows no open lesions. He does have gross deformity of his right arm. X-ray show new finding of broken hardware and displacement of the fracture.  These findings were reviewed the patient and recommendation is for revision ORIF with removal of the current plate and increasing the size of the plate. Patient understands the increased risk of infection, nerve injury, and failure to heal the fracture. Questions encouraged and answered. We will plan on surgery this week. He has been instructed to stop his anticoagulation.

## 2017-09-19 ENCOUNTER — Ambulatory Visit: Payer: Self-pay | Admitting: Family Medicine

## 2017-09-20 ENCOUNTER — Encounter (HOSPITAL_COMMUNITY): Payer: Self-pay | Admitting: *Deleted

## 2017-09-20 NOTE — Progress Notes (Signed)
Pt denies SOB and chest pain, pt under the care of Dr. Mariah MillingGollan, Cardiology. Pt denies having a cardiac cath. Pt stated that last dose of Eliquis was Wednesday, 09/13/17. Pt made aware to stop taking Aspirin,  Vitamins, Fish oil (Maxepa), CO Q 10, Melatonin and herbal medications. Do not take any NSAIDs ie: Ibuprofen, Advil, Naproxen (Aleve), Motrin, BC and Goody Powder or any medication containing Aspirin. Pt made aware to not take Metformin DOS. Pt made aware to check BG every 2 hours prior to arrival to hospital on DOS. Pt made aware to treat a BG < 70 with 4 glucose tabs or glucose gel or 4 ounces of apple or cranberry juice, wait 15 minutes after intervention to recheck BG, if BG remains < 70, call Short Stay unit to speak with a nurse. Pt verbalized understanding of all pre-op instructions.

## 2017-09-21 ENCOUNTER — Ambulatory Visit (HOSPITAL_COMMUNITY): Payer: Medicare Other | Admitting: Certified Registered"

## 2017-09-21 ENCOUNTER — Ambulatory Visit (HOSPITAL_COMMUNITY): Payer: Medicare Other

## 2017-09-21 ENCOUNTER — Ambulatory Visit (HOSPITAL_COMMUNITY)
Admission: RE | Admit: 2017-09-21 | Discharge: 2017-09-21 | Disposition: A | Discharge status: Home / Self Care | Payer: Medicare Other | Source: Ambulatory Visit | Attending: Orthopaedic Surgery | Admitting: Orthopaedic Surgery

## 2017-09-21 ENCOUNTER — Encounter (HOSPITAL_COMMUNITY): Payer: Self-pay | Admitting: Urology

## 2017-09-21 ENCOUNTER — Encounter (HOSPITAL_COMMUNITY)
Admission: RE | Disposition: A | Discharge status: Home / Self Care | Payer: Self-pay | Source: Ambulatory Visit | Attending: Orthopaedic Surgery

## 2017-09-21 DIAGNOSIS — S42301D Unspecified fracture of shaft of humerus, right arm, subsequent encounter for fracture with routine healing: Secondary | ICD-10-CM | POA: Diagnosis not present

## 2017-09-21 DIAGNOSIS — M1990 Unspecified osteoarthritis, unspecified site: Secondary | ICD-10-CM | POA: Diagnosis not present

## 2017-09-21 DIAGNOSIS — Z791 Long term (current) use of non-steroidal anti-inflammatories (NSAID): Secondary | ICD-10-CM | POA: Diagnosis not present

## 2017-09-21 DIAGNOSIS — Y9289 Other specified places as the place of occurrence of the external cause: Secondary | ICD-10-CM | POA: Insufficient documentation

## 2017-09-21 DIAGNOSIS — Z79899 Other long term (current) drug therapy: Secondary | ICD-10-CM | POA: Insufficient documentation

## 2017-09-21 DIAGNOSIS — Z87891 Personal history of nicotine dependence: Secondary | ICD-10-CM | POA: Insufficient documentation

## 2017-09-21 DIAGNOSIS — Z79891 Long term (current) use of opiate analgesic: Secondary | ICD-10-CM | POA: Insufficient documentation

## 2017-09-21 DIAGNOSIS — I4891 Unspecified atrial fibrillation: Secondary | ICD-10-CM | POA: Insufficient documentation

## 2017-09-21 DIAGNOSIS — Y838 Other surgical procedures as the cause of abnormal reaction of the patient, or of later complication, without mention of misadventure at the time of the procedure: Secondary | ICD-10-CM | POA: Diagnosis not present

## 2017-09-21 DIAGNOSIS — Z419 Encounter for procedure for purposes other than remedying health state, unspecified: Secondary | ICD-10-CM

## 2017-09-21 DIAGNOSIS — S42301S Unspecified fracture of shaft of humerus, right arm, sequela: Secondary | ICD-10-CM | POA: Diagnosis not present

## 2017-09-21 DIAGNOSIS — G8918 Other acute postprocedural pain: Secondary | ICD-10-CM | POA: Diagnosis not present

## 2017-09-21 DIAGNOSIS — Z7901 Long term (current) use of anticoagulants: Secondary | ICD-10-CM | POA: Insufficient documentation

## 2017-09-21 DIAGNOSIS — Z7984 Long term (current) use of oral hypoglycemic drugs: Secondary | ICD-10-CM | POA: Insufficient documentation

## 2017-09-21 DIAGNOSIS — Z472 Encounter for removal of internal fixation device: Secondary | ICD-10-CM | POA: Diagnosis not present

## 2017-09-21 DIAGNOSIS — X58XXXA Exposure to other specified factors, initial encounter: Secondary | ICD-10-CM | POA: Diagnosis not present

## 2017-09-21 DIAGNOSIS — E785 Hyperlipidemia, unspecified: Secondary | ICD-10-CM | POA: Diagnosis not present

## 2017-09-21 DIAGNOSIS — T84110A Breakdown (mechanical) of internal fixation device of right humerus, initial encounter: Secondary | ICD-10-CM | POA: Diagnosis not present

## 2017-09-21 DIAGNOSIS — I10 Essential (primary) hypertension: Secondary | ICD-10-CM | POA: Diagnosis not present

## 2017-09-21 DIAGNOSIS — F418 Other specified anxiety disorders: Secondary | ICD-10-CM | POA: Insufficient documentation

## 2017-09-21 DIAGNOSIS — S42391D Other fracture of shaft of right humerus, subsequent encounter for fracture with routine healing: Secondary | ICD-10-CM

## 2017-09-21 DIAGNOSIS — K219 Gastro-esophageal reflux disease without esophagitis: Secondary | ICD-10-CM | POA: Insufficient documentation

## 2017-09-21 DIAGNOSIS — S42201A Unspecified fracture of upper end of right humerus, initial encounter for closed fracture: Secondary | ICD-10-CM | POA: Diagnosis not present

## 2017-09-21 HISTORY — PX: ORIF HUMERUS FRACTURE: SHX2126

## 2017-09-21 HISTORY — PX: HARDWARE REMOVAL: SHX979

## 2017-09-21 HISTORY — DX: Unspecified fracture of shaft of humerus, unspecified arm, initial encounter for closed fracture: S42.309A

## 2017-09-21 LAB — BASIC METABOLIC PANEL
Anion gap: 9 (ref 5–15)
BUN: 19 mg/dL (ref 6–20)
CALCIUM: 8.4 mg/dL — AB (ref 8.9–10.3)
CHLORIDE: 103 mmol/L (ref 101–111)
CO2: 25 mmol/L (ref 22–32)
CREATININE: 1.08 mg/dL (ref 0.61–1.24)
GFR calc Af Amer: >60 mL/min (ref >60)
Glucose, Bld: 90 mg/dL (ref 65–99)
Potassium: 4 mmol/L (ref 3.5–5.1)
SODIUM: 137 mmol/L (ref 135–145)

## 2017-09-21 LAB — CBC
HEMATOCRIT: 39.5 % (ref 39.0–52.0)
Hemoglobin: 12.6 g/dL — ABNORMAL LOW (ref 13.0–17.0)
MCH: 28.9 pg (ref 26.0–34.0)
MCHC: 31.9 g/dL (ref 30.0–36.0)
MCV: 90.6 fL (ref 78.0–100.0)
PLATELETS: 259 10*3/uL (ref 150–400)
RBC: 4.36 MIL/uL (ref 4.22–5.81)
RDW: 14.6 % (ref 11.5–15.5)
WBC: 4.8 10*3/uL (ref 4.0–10.5)

## 2017-09-21 LAB — PROTIME-INR
INR: 1.02
PROTHROMBIN TIME: 13.3 s (ref 11.4–15.2)

## 2017-09-21 LAB — GLUCOSE, CAPILLARY
GLUCOSE-CAPILLARY: 105 mg/dL — AB (ref 65–99)
Glucose-Capillary: 98 mg/dL (ref 65–99)

## 2017-09-21 SURGERY — OPEN REDUCTION INTERNAL FIXATION (ORIF) HUMERAL SHAFT FRACTURE
Anesthesia: General | Site: Arm Upper | Laterality: Right

## 2017-09-21 MED ORDER — MIDAZOLAM HCL 2 MG/2ML IJ SOLN: 2.0000 mg | Freq: Once | INTRAMUSCULAR | Status: DC

## 2017-09-21 MED ORDER — METOPROLOL SUCCINATE ER 50 MG PO TB24
50.0000 mg | ORAL_TABLET | Freq: Once | ORAL | Status: AC
  Administered 2017-09-21: 50 mg via ORAL

## 2017-09-21 MED ORDER — 0.9 % SODIUM CHLORIDE (POUR BTL) OPTIME
TOPICAL | Status: DC | PRN
Start: 2017-09-21 — End: 2017-09-21
  Administered 2017-09-21: 1000 mL

## 2017-09-21 MED ORDER — SUGAMMADEX SODIUM 200 MG/2ML IV SOLN
INTRAVENOUS | Status: AC
  Filled 2017-09-21: qty 2

## 2017-09-21 MED ORDER — TRANEXAMIC ACID 1000 MG/10ML IV SOLN
2000.0000 mg | Freq: Once | INTRAVENOUS | Status: DC
  Filled 2017-09-21: qty 20

## 2017-09-21 MED ORDER — MIDAZOLAM HCL 2 MG/2ML IJ SOLN
INTRAMUSCULAR | Status: AC
  Administered 2017-09-21: 2 mg
  Filled 2017-09-21: qty 2

## 2017-09-21 MED ORDER — METOPROLOL SUCCINATE ER 25 MG PO TB24
ORAL_TABLET | ORAL | Status: AC
  Filled 2017-09-21: qty 2

## 2017-09-21 MED ORDER — PROPOFOL 10 MG/ML IV BOLUS
INTRAVENOUS | Status: DC | PRN
  Administered 2017-09-21: 160 mg via INTRAVENOUS

## 2017-09-21 MED ORDER — SENNOSIDES-DOCUSATE SODIUM 8.6-50 MG PO TABS: 1.0000 | ORAL_TABLET | Freq: Every evening | ORAL | 1 refills | Status: DC | PRN

## 2017-09-21 MED ORDER — SUGAMMADEX SODIUM 200 MG/2ML IV SOLN
INTRAVENOUS | Status: DC | PRN
  Administered 2017-09-21: 200 mg via INTRAVENOUS

## 2017-09-21 MED ORDER — CEFAZOLIN SODIUM-DEXTROSE 2-4 GM/100ML-% IV SOLN
2.0000 g | INTRAVENOUS | Status: AC
  Administered 2017-09-21: 2 g via INTRAVENOUS

## 2017-09-21 MED ORDER — FENTANYL CITRATE (PF) 100 MCG/2ML IJ SOLN
INTRAMUSCULAR | Status: AC
  Administered 2017-09-21: 100 ug
  Filled 2017-09-21: qty 2

## 2017-09-21 MED ORDER — ROCURONIUM BROMIDE 50 MG/5ML IV SOLN
INTRAVENOUS | Status: AC
  Filled 2017-09-21: qty 1

## 2017-09-21 MED ORDER — BUPIVACAINE-EPINEPHRINE (PF) 0.5% -1:200000 IJ SOLN
INTRAMUSCULAR | Status: DC | PRN
  Administered 2017-09-21: 25 mL via PERINEURAL

## 2017-09-21 MED ORDER — KETOROLAC TROMETHAMINE 30 MG/ML IJ SOLN
INTRAMUSCULAR | Status: DC | PRN
  Administered 2017-09-21: 30 mg via INTRAVENOUS

## 2017-09-21 MED ORDER — DEXAMETHASONE SODIUM PHOSPHATE 10 MG/ML IJ SOLN
INTRAMUSCULAR | Status: DC | PRN
  Administered 2017-09-21: 10 mg via INTRAVENOUS

## 2017-09-21 MED ORDER — PROPOFOL 10 MG/ML IV BOLUS
INTRAVENOUS | Status: AC
  Filled 2017-09-21: qty 20

## 2017-09-21 MED ORDER — MIDAZOLAM HCL 2 MG/2ML IJ SOLN
INTRAMUSCULAR | Status: AC
  Filled 2017-09-21: qty 2

## 2017-09-21 MED ORDER — TRANEXAMIC ACID 1000 MG/10ML IV SOLN
INTRAVENOUS | Status: AC | PRN
  Administered 2017-09-21: 2000 mg via TOPICAL

## 2017-09-21 MED ORDER — FENTANYL CITRATE (PF) 100 MCG/2ML IJ SOLN: 50.0000 ug | Freq: Once | INTRAMUSCULAR | Status: DC

## 2017-09-21 MED ORDER — PHENYLEPHRINE HCL 10 MG/ML IJ SOLN
INTRAMUSCULAR | Status: DC | PRN
  Administered 2017-09-21: 15 ug/min via INTRAVENOUS

## 2017-09-21 MED ORDER — FENTANYL CITRATE (PF) 250 MCG/5ML IJ SOLN
INTRAMUSCULAR | Status: DC | PRN
Start: 2017-09-21 — End: 2017-09-21
  Administered 2017-09-21: 150 ug via INTRAVENOUS

## 2017-09-21 MED ORDER — FENTANYL CITRATE (PF) 250 MCG/5ML IJ SOLN
INTRAMUSCULAR | Status: AC
  Filled 2017-09-21: qty 5

## 2017-09-21 MED ORDER — OXYCODONE-ACETAMINOPHEN 5-325 MG PO TABS: 1.0000 | ORAL_TABLET | ORAL | 0 refills | Status: DC | PRN

## 2017-09-21 MED ORDER — LIDOCAINE 2% (20 MG/ML) 5 ML SYRINGE
INTRAMUSCULAR | Status: AC
  Filled 2017-09-21: qty 5

## 2017-09-21 MED ORDER — DEXAMETHASONE SODIUM PHOSPHATE 10 MG/ML IJ SOLN
INTRAMUSCULAR | Status: AC
  Filled 2017-09-21: qty 1

## 2017-09-21 MED ORDER — LIDOCAINE 2% (20 MG/ML) 5 ML SYRINGE
INTRAMUSCULAR | Status: DC | PRN
  Administered 2017-09-21: 100 mg via INTRAVENOUS

## 2017-09-21 MED ORDER — ROCURONIUM BROMIDE 10 MG/ML (PF) SYRINGE
PREFILLED_SYRINGE | INTRAVENOUS | Status: DC | PRN
  Administered 2017-09-21: 50 mg via INTRAVENOUS

## 2017-09-21 MED ORDER — LACTATED RINGERS IV SOLN
INTRAVENOUS | Status: DC
  Administered 2017-09-21: 10:00:00 via INTRAVENOUS

## 2017-09-21 MED ORDER — ONDANSETRON HCL 4 MG/2ML IJ SOLN
INTRAMUSCULAR | Status: DC | PRN
  Administered 2017-09-21: 4 mg via INTRAVENOUS

## 2017-09-21 MED ORDER — CEFAZOLIN SODIUM-DEXTROSE 2-4 GM/100ML-% IV SOLN
INTRAVENOUS | Status: AC
  Filled 2017-09-21: qty 100

## 2017-09-21 SURGICAL SUPPLY — 60 items
BIT DRILL 3.5 QC 155 (BIT) ×2 IMPLANT
BLADE SURG 10 STRL SS (BLADE) IMPLANT
BNDG ESMARK 4X9 LF (GAUZE/BANDAGES/DRESSINGS) IMPLANT
COVER SURGICAL LIGHT HANDLE (MISCELLANEOUS) ×2 IMPLANT
CUFF TOURNIQUET SINGLE 18IN (TOURNIQUET CUFF) ×2 IMPLANT
CUFF TOURNIQUET SINGLE 24IN (TOURNIQUET CUFF) IMPLANT
DRAPE C-ARM 42X72 X-RAY (DRAPES) ×2 IMPLANT
DRAPE IMP U-DRAPE 54X76 (DRAPES) ×2 IMPLANT
DRAPE INCISE IOBAN 66X45 STRL (DRAPES) ×2 IMPLANT
DRAPE SURG 17X23 STRL (DRAPES) ×2 IMPLANT
DRAPE U-SHAPE 47X51 STRL (DRAPES) ×2 IMPLANT
DRSG TEGADERM 4X4.75 (GAUZE/BANDAGES/DRESSINGS) ×2 IMPLANT
ELECT CAUTERY BLADE 6.4 (BLADE) ×2 IMPLANT
ELECT REM PT RETURN 9FT ADLT (ELECTROSURGICAL) ×2
ELECTRODE REM PT RTRN 9FT ADLT (ELECTROSURGICAL) ×1 IMPLANT
FACESHIELD WRAPAROUND (MASK) ×2 IMPLANT
GAUZE SPONGE 4X4 12PLY STRL (GAUZE/BANDAGES/DRESSINGS) ×2 IMPLANT
GAUZE SPONGE 4X4 12PLY STRL LF (GAUZE/BANDAGES/DRESSINGS) ×2 IMPLANT
GAUZE XEROFORM 1X8 LF (GAUZE/BANDAGES/DRESSINGS) ×2 IMPLANT
GAUZE XEROFORM 5X9 LF (GAUZE/BANDAGES/DRESSINGS) ×2 IMPLANT
GLOVE BIO SURGEON STRL SZ 6 (GLOVE) ×2 IMPLANT
GLOVE ECLIPSE 8.0 STRL XLNG CF (GLOVE) ×4 IMPLANT
GLOVE SKINSENSE NS SZ7.5 (GLOVE) ×2
GLOVE SKINSENSE STRL SZ7.5 (GLOVE) ×2 IMPLANT
GOWN STRL REIN XL XLG (GOWN DISPOSABLE) ×6 IMPLANT
K-WIRE 2.0 (WIRE) ×4
K-WIRE TROCAR PT 2.0 150MM (WIRE) ×4
KIT BASIN OR (CUSTOM PROCEDURE TRAY) ×2 IMPLANT
KIT ROOM TURNOVER OR (KITS) ×2 IMPLANT
KWIRE TROCAR PT 2.0 150MM (WIRE) ×4 IMPLANT
MANIFOLD NEPTUNE II (INSTRUMENTS) ×2 IMPLANT
NS IRRIG 1000ML POUR BTL (IV SOLUTION) ×2 IMPLANT
PACK SHOULDER (CUSTOM PROCEDURE TRAY) ×2 IMPLANT
PACK UNIVERSAL I (CUSTOM PROCEDURE TRAY) ×2 IMPLANT
PAD ARMBOARD 7.5X6 YLW CONV (MISCELLANEOUS) ×4 IMPLANT
PAD CAST 4YDX4 CTTN HI CHSV (CAST SUPPLIES) ×1 IMPLANT
PADDING CAST COTTON 4X4 STRL (CAST SUPPLIES) ×1
PLATE BONE COMPR 4.5X177MM 9H (Plate) ×2 IMPLANT
PUTTY DBX 10CC (Bone Implant) ×2 IMPLANT
SCREW 4.5X22 (Screw) ×2 IMPLANT
SCREW 4.5X26 (Screw) ×4 IMPLANT
SCREW 4.5X28 (Screw) ×8 IMPLANT
SCREW 4.5X30 (Screw) ×2 IMPLANT
SCREW NONLOCK 4.5X32 (Screw) ×4 IMPLANT
SCREW NONLOCKING 4.5X24 (Screw) ×2 IMPLANT
SLING ARM IMMOBILIZER LRG (SOFTGOODS) ×2 IMPLANT
SPONGE LAP 18X18 X RAY DECT (DISPOSABLE) ×2 IMPLANT
STAPLER VISISTAT 35W (STAPLE) IMPLANT
SUCTION FRAZIER HANDLE 10FR (MISCELLANEOUS)
SUCTION TUBE FRAZIER 10FR DISP (MISCELLANEOUS) IMPLANT
SUT ETHILON 3 0 PS 1 (SUTURE) ×8 IMPLANT
SUT VIC AB 0 CT1 27 (SUTURE) ×3
SUT VIC AB 0 CT1 27XBRD ANBCTR (SUTURE) ×3 IMPLANT
SUT VIC AB 2-0 CT1 27 (SUTURE) ×4
SUT VIC AB 2-0 CT1 TAPERPNT 27 (SUTURE) ×4 IMPLANT
SYR CONTROL 10ML LL (SYRINGE) IMPLANT
TOWEL OR 17X24 6PK STRL BLUE (TOWEL DISPOSABLE) IMPLANT
TOWEL OR 17X26 10 PK STRL BLUE (TOWEL DISPOSABLE) ×2 IMPLANT
UNDERPAD 30X30 (UNDERPADS AND DIAPERS) ×2 IMPLANT
WATER STERILE IRR 1000ML POUR (IV SOLUTION) ×2 IMPLANT

## 2017-09-21 NOTE — H&P (Signed)
PREOPERATIVE H&P  Chief Complaint: right humerus fracture  HPI: Jonathon Middleton is a 66 y.o. male who presents for surgical treatment of right humerus fracture.  He denies any changes in medical history.  Past Medical History:  Diagnosis Date  . Arthritis   . Atrial fibrillation (HCC)   . Depression   . Depression with anxiety   . GERD (gastroesophageal reflux disease)    08/24/18- not current  . History of chicken pox   . Humerus fracture    x 2  . Hyperlipidemia   . Hypertension   . Nonalcoholic fatty liver disease    ?h/o elevated LFTs, has not had [place]  . Pre-diabetes    Metobolic Syndrome  . Prediabetes    treated with diet & exercise. saw [facility] nutritionist 04/2014   Past Surgical History:  Procedure Laterality Date  . COLONOSCOPY    . KNEE SURGERY Right 2009   x 2 (Dr. Ernest Pine)  Arthroscopy- torn MCL  . ORIF HUMERUS FRACTURE Right 08/25/2017   Procedure: OPEN REDUCTION INTERNAL FIXATION (ORIF) RIGHT HUMERAL SHAFT FRACTURE;  Surgeon: Tarry Kos, MD;  Location: [facility] OR;  Service: Orthopedics;  Laterality: Right;   Social History   Social History  . Marital status: Married    Spouse name: N/A  . Number of children: N/A  . Years of education: N/A   Social History Main Topics  . Smoking status: Former Smoker    Packs/day: 1.00    Years: 5.00    Types: Cigars  . Smokeless tobacco: Never Used     Comment: ocassional last time 03/2017  . Alcohol use 1.2 oz/week    2 Cans of beer per week     Comment: last use August 25, 2017  . Drug use: No  . Sexual activity: Not Asked   Other Topics Concern  . 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 Hypertension Mother   . CAD Father 11   massive MI, deceased  . CAD Paternal Uncle 78       MI, smoker  . Stroke Paternal Aunt   . Diabetes Paternal Grandmother   . Cancer Maternal Uncle        lung cancer (non small cell), smoker   No Known Allergies Prior to Admission medications   Medication Sig Start Date End Date Taking? Authorizing Provider  amiodarone (PACERONE) 200 MG tablet Take 1 tablet (200 mg total) by mouth daily. 03/16/17  Yes Gollan, Tollie Pizza, MD  apixaban (ELIQUIS) 5 MG TABS tablet Take 1 tablet (5 mg total) by mouth 2 (two) times daily. 09/14/17  Yes Antonieta Iba, MD  atorvastatin (LIPITOR) 40 MG tablet TAKE 1 TABLET BY MOUTH AT  BEDTIME 05/19/17  Yes Eustaquio Boyden, MD  Cholecalciferol (VITAMIN D) 2000 units CAPS Take 2,000 Units by mouth 2 (two) times daily.    Yes [provider]  Coenzyme Q10 (CO Q-10) 100 MG CAPS Take 100 mg by mouth daily with breakfast.    Yes [provider]  diltiazem (CARDIZEM CD) 120 MG 24 hr capsule Take 1 capsule (120 mg total) by mouth daily. Patient taking differently: Take 120 mg by mouth at bedtime.  08/19/16 08/23/18  Yes Gollan, Tollie Pizzaimothy J, MD  glucose blood (ONE TOUCH ULTRA TEST) test strip Use to check sugar once daily and as needed. Dx: E11.9 03/20/17  Yes Eustaquio BoydenGutierrez, Javier, MD  ibuprofen (ADVIL,MOTRIN) 200 MG tablet Take 400 mg by mouth every 8 (eight) hours as needed (for pain.).   Yes [provider]  Lancets Letta Pate(ONETOUCH ULTRASOFT) lancets USE AS INSTRUCTED 10/06/14  Yes Eustaquio BoydenGutierrez, Javier, MD  Melatonin 10 MG TABS Take 10 mg by mouth at bedtime.    Yes [provider]  methocarbamol (ROBAXIN) 750 MG tablet Take 1 tablet (750 mg total) by mouth 2 (two) times daily as needed for muscle spasms. 08/25/17  Yes Tarry KosXu, Naiping M, MD  metoprolol succinate (TOPROL-XL) 50 MG 24 hr tablet Take 1 tablet (50 mg total) by mouth daily. NEEDS OFFICE VISIT Patient taking differently: Take 50 mg by mouth at bedtime. NEEDS OFFICE VISIT 06/19/17  Yes Eustaquio BoydenGutierrez,  Javier, MD  Multiple Vitamin (MULTIVITAMIN WITH MINERALS) TABS tablet Take 1 tablet by mouth daily with breakfast.   Yes [provider]  omega-3 fish oil (MAXEPA) 1000 MG CAPS capsule Take 1 capsule by mouth 2 (two) times daily.    Yes [provider]  oxyCODONE (OXY IR/ROXICODONE) 5 MG immediate release tablet Take 1-3 tablets (5-15 mg total) by mouth every 4 (four) hours as needed. 08/25/17  Yes Tarry KosXu, Naiping M, MD  sertraline (ZOLOFT) 50 MG tablet TAKE 1 TABLET BY MOUTH  DAILY Patient taking differently: TAKE 1 TABLET BY MOUTH  DAILY AT BEDTIME. 05/19/17  Yes Eustaquio BoydenGutierrez, Javier, MD  calcium-vitamin D (OSCAL WITH D) 500-200 MG-UNIT tablet Take 1 tablet by mouth 3 (three) times daily. Patient not taking: Reported on 09/19/2017 08/25/17   Tarry KosXu, Naiping M, MD  metFORMIN (GLUCOPHAGE) 500 MG tablet TAKE 1 TABLET BY MOUTH  DAILY WITH BREAKFAST 05/19/17   Eustaquio BoydenGutierrez, Javier, MD  ondansetron (ZOFRAN) 4 MG tablet Take 1-2 tablets (4-8 mg total) by mouth every 8 (eight) hours as needed for nausea or vomiting. Patient not taking: Reported on 09/08/2017 08/25/17   Tarry KosXu, Naiping M, MD  oxyCODONE (OXYCONTIN) 10 mg 12 hr tablet Take 1 tablet (10 mg total) by mouth every 12 (twelve) hours. Patient not taking: Reported on 09/08/2017 08/25/17   Tarry KosXu, Naiping M, MD  oxyCODONE (ROXICODONE) 5 MG immediate release tablet Take 1 tablet (5 mg total) by mouth every 8 (eight) hours as needed. Patient not taking: Reported on 09/19/2017 08/19/17 08/19/18  Evon SlackGaines, Thomas C, PA-C  promethazine (PHENERGAN) 25 MG tablet Take 1 tablet (25 mg total) by mouth every 6 (six) hours as needed for nausea. Patient not taking: Reported on 09/08/2017 08/25/17   Tarry KosXu, Naiping M, MD  senna-docusate (SENOKOT S) 8.6-50 MG tablet Take 1 tablet by mouth at bedtime as needed. Patient not taking: Reported on 09/08/2017 08/25/17   Tarry KosXu, Naiping M, MD  zinc sulfate 220 (50 Zn) MG capsule Take 1 capsule (220 mg total) by mouth daily. Patient not taking:  Reported on 09/19/2017 08/25/17   Tarry KosXu, Naiping M, MD     Positive ROS: All other systems have been reviewed and were otherwise negative with the exception of those mentioned in the HPI and as above.  Physical Exam: General: Alert, no acute distress Cardiovascular: No pedal edema Respiratory: No cyanosis, no use of accessory musculature GI: abdomen soft Skin: No lesions in the area of chief complaint Neurologic: Sensation intact distally Psychiatric: Patient is competent for consent with normal mood and affect Lymphatic: no lymphedema  MUSCULOSKELETAL:  exam stable  Assessment: right humerus fracture  Plan: Plan for Procedure(s): OPEN REDUCTION INTERNAL FIXATION (ORIF) RIGHT HUMERAL SHAFT FRACTURE  The risks benefits and alternatives were discussed with the patient including but not limited to the risks of nonoperative treatment, versus surgical intervention including infection, bleeding, nerve injury,  blood clots, cardiopulmonary complications, morbidity, mortality, among others, and they were willing to proceed.   Glee Arvin, MD   09/21/2017 11:11 AM

## 2017-09-21 NOTE — Anesthesia Procedure Notes (Addendum)
Anesthesia Regional Block: Interscalene brachial plexus block   Pre-Anesthetic Checklist: ,, timeout performed, Correct Patient, Correct Site, Correct Laterality, Correct Procedure, Correct Position, site marked, Risks and benefits discussed, pre-op evaluation,  At surgeon's request and post-op pain management  Laterality: Right  Prep: chloraprep       Needles:      Needle Length: 9cm  Needle Gauge: 21     Additional Needles:   Procedures:,,,, ultrasound used (permanent image in chart),,,,  Narrative:  Start time: 09/21/2017 10:30 AM End time: 09/21/2017 10:38 AM Injection made incrementally with aspirations every 5 mL. Anesthesiologist: Cristela BlueJACKSON, Vandana Haman

## 2017-09-21 NOTE — Transfer of Care (Signed)
Immediate Anesthesia Transfer of Care Note  Patient: Jonathon Middleton  Procedure(s) Performed: OPEN REDUCTION INTERNAL FIXATION (ORIF) RIGHT HUMERAL SHAFT FRACTURE (Right Arm Upper) HARDWARE REMOVAL OF RIGHT HUMERUS (Right Arm Upper)  Patient Location: PACU  Anesthesia Type:General  Level of Consciousness: awake, oriented and patient cooperative  Airway & Oxygen Therapy: Patient Spontanous Breathing and Patient connected to face mask oxygen  Post-op Assessment: Report given to RN and Post -op Vital signs reviewed and stable  Post vital signs: Reviewed  Last Vitals:  Vitals:   09/21/17 1045 09/21/17 1050  BP: (!) 191/93 (!) 181/85  Pulse: 78 73  Resp: (!) 21 19  Temp:    SpO2: 99% 97%    Last Pain:  Vitals:   09/21/17 0944  TempSrc: Oral      Patients Stated Pain Goal: 3 (09/21/17 1013)  Complications: No apparent anesthesia complications

## 2017-09-21 NOTE — Op Note (Signed)
   Date of Surgery: 09/21/2017  INDICATIONS: [name] is a 66 y.o.-year-old male with a right peri-implant humeral shaft fracture that he sustained while being noncompliant with weightbearing restrictions from the initial ORIF of his right humeral shaft fracture just 4 weeks ago;  The patient did consent to the procedure after discussion of the risks and benefits.  PREOPERATIVE DIAGNOSIS:  1. Right humeral shaft fracture with breakage of 3.5 mm Synthes dynamic compression plate  POSTOPERATIVE DIAGNOSIS: Same.  PROCEDURE:  1. Revision open reduction internal fixation of right humeral shaft fracture 2. Removal of broken 3.5 mm Synthes dynamic compression plate  SURGEON: N. Glee ArvinMichael Orel Middleton, M.D.  ASSIST: none.  ANESTHESIA:  general, regional  IV FLUIDS AND URINE: See anesthesia.  ESTIMATED BLOOD LOSS: 200 mL.  IMPLANTS: Katrinka BlazingSmith and nephew 4.5 mm 9 hole compression plate  DRAINS: none  COMPLICATIONS: None.  DESCRIPTION OF PROCEDURE: The patient was brought to the operating room and placed supine on the operating table.  The patient had been signed prior to the procedure and this was documented. The patient had the anesthesia placed by the anesthesiologist.  A time-out was performed to confirm that this was the correct patient, site, side and location. The patient did receive antibiotics prior to the incision and was re-dosed during the procedure as needed at indicated intervals.  The patient had the operative extremity prepped and draped in the standard surgical fashion.    I made an incision through the previous healed incision. I utilized the previous tissue planes through an anterolateral approach to the humeral shaft. Dissection was carried down to the humerus. Soft tissue was elevated off of the humeral shaft and the previous broken hardware. Exposure was then obtained and retractors were placed. Care was taken to perform subperiosteal elevation in order to protect the radial nerve. The  previous broken plate and the 3.5 mm screws were backed out sequentially by hand.  The hardware was then removed and I did not find any evidence of infection within the fracture site or in the surgical wound bed. Organized hematoma was removed from the fracture site. The fracture was then reduced and a broad 4.5 mm compression plate was placed on the lateral aspect of the humeral shaft and provisionally fixed with 4 K wires. This was then confirmed under fluoroscopy. I then placed nonlocking screws both above and below the fracture using standard AO technique. The first 2 screws that I placed were in compression mode. The K wires were then removed. Final x-rays were taken. The wound was then thoroughly irrigated. 10 mL of demineralized bony matrix was placed in the fracture site in order to promote healing. The wound was then closed in layer fashion using 0 Vicryl for the fascia, 2-0 Vicryl for the subcutaneous layer and 3-0 nylon for the skin. Sterile dressings were applied. Patient was placed in a shoulder sling.He tolerated the procedure well and no immediate complications.  POSTOPERATIVE PLAN: patient will be nonweightbearing for 6 weeks.  Jonathon ReelN. Jonathon Mateya Torti, MD [facility] [phone] 3:02 PM

## 2017-09-21 NOTE — Anesthesia Preprocedure Evaluation (Addendum)
Anesthesia Evaluation  Patient identified by MRN, date of birth, ID band Patient awake    Reviewed: Allergy & Precautions, H&P , NPO status , Patient's Chart, lab work & pertinent test results, reviewed documented beta blocker date and time   Airway Mallampati: III  TM Distance: >3 FB Neck ROM: Full    Dental no notable dental hx. (+) Teeth Intact, Dental Advisory Given   Pulmonary neg pulmonary ROS, former smoker,    Pulmonary exam normal breath sounds clear to auscultation       Cardiovascular hypertension, Pt. on medications and Pt. on home beta blockers + dysrhythmias Atrial Fibrillation  Rhythm:Regular Rate:Normal     Neuro/Psych Depression negative neurological ROS  negative psych ROS   GI/Hepatic Neg liver ROS, GERD  Medicated and Controlled,  Endo/Other  diabetes, Type 2, Oral Hypoglycemic Agents  Renal/GU negative Renal ROS  negative genitourinary   Musculoskeletal  (+) Arthritis , Osteoarthritis,    Abdominal   Peds  Hematology negative hematology ROS (+)   Anesthesia Other Findings   Reproductive/Obstetrics negative OB ROS                            Anesthesia Physical  Anesthesia Plan  ASA: III  Anesthesia Plan: General   Post-op Pain Management:  Regional for Post-op pain   Induction: Intravenous  PONV Risk Score and Plan: 2 and Ondansetron, Dexamethasone, Midazolam and Treatment may vary due to age or medical condition  Airway Management Planned: Oral ETT  Additional Equipment:   Intra-op Plan:   Post-operative Plan: Extubation in OR  Informed Consent: I have reviewed the patients History and Physical, chart, labs and discussed the procedure including the risks, benefits and alternatives for the proposed anesthesia with the patient or authorized representative who has indicated his/her understanding and acceptance.   Dental advisory given  Plan Discussed with:  CRNA  Anesthesia Plan Comments:         Anesthesia Quick Evaluation

## 2017-09-21 NOTE — Progress Notes (Signed)
Pt up in recliner, uses IS 1200-1400cc, strong cough. He is awake, alert, no c/o. O2 sat 90-94 % on RA. Dr Okey Dupreose fully updated-OK to dc pt to phase 2 then home.

## 2017-09-21 NOTE — Anesthesia Procedure Notes (Signed)
Procedure Name: Intubation Date/Time: 09/21/2017 1:03 PM Performed by: Julieta Bellini Pre-anesthesia Checklist: Patient identified, Emergency Drugs available, Suction available and Patient being monitored Patient Re-evaluated:Patient Re-evaluated prior to induction Oxygen Delivery Method: Circle system utilized Preoxygenation: Pre-oxygenation with 100% oxygen Induction Type: IV induction Ventilation: Mask ventilation without difficulty and Oral airway inserted - appropriate to patient size Laryngoscope Size: Mac and 4 Grade View: Grade I Tube type: Oral Tube size: 7.5 mm Number of attempts: 1 Airway Equipment and Method: Stylet Placement Confirmation: ETT inserted through vocal cords under direct vision,  positive ETCO2 and breath sounds checked- equal and bilateral Secured at: 22 cm Tube secured with: Tape Dental Injury: Teeth and Oropharynx as per pre-operative assessment

## 2017-09-21 NOTE — Discharge Instructions (Signed)
Postoperative instructions:  Weightbearing instructions: non weight bearing for 6 weeks  Keep your dressing and/or splint clean and dry at all times.  You can remove your dressing on post-operative day #3 and change with a dry/sterile dressing or Band-Aids as needed thereafter.    Incision instructions:  Do not soak your incision for 3 weeks after surgery.  If the incision gets wet, pat dry and do not scrub the incision.  Pain control:  You have been given a prescription to be taken as directed for post-operative pain control.  In addition, elevate the operative extremity above the heart at all times to prevent swelling and throbbing pain.  Take over-the-counter Colace, 100mg  by mouth twice a day while taking narcotic pain medications to help prevent constipation.  Follow up appointments: 1) 10-14 days for suture removal and wound check. 2) Dr. Roda ShuttersXu as scheduled.   -------------------------------------------------------------------------------------------------------------  After Surgery Pain Control:  After your surgery, post-surgical discomfort or pain is likely. This discomfort can last several days to a few weeks. At certain times of the day your discomfort may be more intense.  Did you receive a nerve block?  A nerve block can provide pain relief for one hour to two days after your surgery. As long as the nerve block is working, you will experience little or no sensation in the area the surgeon operated on.  As the nerve block wears off, you will begin to experience pain or discomfort. It is very important that you begin taking your prescribed pain medication before the nerve block fully wears off. Treating your pain at the first sign of the block wearing off will ensure your pain is better controlled and more tolerable when full-sensation returns. Do not wait until the pain is intolerable, as the medicine will be less effective. It is better to treat pain in advance than to try and catch  up.  General Anesthesia:  If you did not receive a nerve block during your surgery, you will need to start taking your pain medication shortly after your surgery and should continue to do so as prescribed by your surgeon.  Pain Medication:  Most commonly we prescribe Vicodin and Percocet for post-operative pain. Both of these medications contain a combination of acetaminophen (Tylenol) and a narcotic to help control pain.   It takes between 30 and 45 minutes before pain medication starts to work. It is important to take your medication before your pain level gets too intense.   Nausea is a common side effect of many pain medications. You will want to eat something before taking your pain medicine to help prevent nausea.   If you are taking a prescription pain medication that contains acetaminophen, we recommend that you do not take additional over the counter acetaminophen (Tylenol).  Other pain relieving options:   Using a cold pack to ice the affected area a few times a day (15 to 20 minutes at a time) can help to relieve pain, reduce swelling and bruising.   Elevation of the affected area can also help to reduce pain and swelling.

## 2017-09-22 ENCOUNTER — Encounter (HOSPITAL_COMMUNITY): Payer: Self-pay | Admitting: Orthopaedic Surgery

## 2017-09-22 NOTE — Anesthesia Postprocedure Evaluation (Signed)
Anesthesia Post Note  Patient: Corinda Gublerevin Flagg  Procedure(s) Performed: OPEN REDUCTION INTERNAL FIXATION (ORIF) RIGHT HUMERAL SHAFT FRACTURE (Right Arm Upper) HARDWARE REMOVAL OF RIGHT HUMERUS (Right Arm Upper)     Patient location during evaluation: PACU Anesthesia Type: General and Regional Level of consciousness: awake and alert Pain management: pain level controlled Vital Signs Assessment: post-procedure vital signs reviewed and stable Respiratory status: spontaneous breathing, nonlabored ventilation, respiratory function stable and patient connected to nasal cannula oxygen Cardiovascular status: blood pressure returned to baseline and stable Postop Assessment: no apparent nausea or vomiting Anesthetic complications: no    Last Vitals:  Vitals:   09/21/17 1545 09/21/17 1600  BP: (!) 163/89 (!) 155/88  Pulse: (!) 55 (!) 57  Resp: 15 16  Temp: 36.6 C   SpO2: 93% 95%    Last Pain:  Vitals:   09/21/17 1600  TempSrc:   PainSc: 0-No pain                 Carsyn Boster,JAMES TERRILL

## 2017-09-27 ENCOUNTER — Encounter (HOSPITAL_COMMUNITY): Payer: Self-pay | Admitting: Orthopaedic Surgery

## 2017-09-28 ENCOUNTER — Ambulatory Visit (INDEPENDENT_AMBULATORY_CARE_PROVIDER_SITE_OTHER): Payer: Medicare Other

## 2017-09-28 ENCOUNTER — Encounter (INDEPENDENT_AMBULATORY_CARE_PROVIDER_SITE_OTHER): Payer: Self-pay | Admitting: Orthopaedic Surgery

## 2017-09-28 ENCOUNTER — Ambulatory Visit (INDEPENDENT_AMBULATORY_CARE_PROVIDER_SITE_OTHER): Payer: Medicare Other | Admitting: Psychology

## 2017-09-28 ENCOUNTER — Ambulatory Visit (INDEPENDENT_AMBULATORY_CARE_PROVIDER_SITE_OTHER): Payer: Medicare Other | Admitting: Orthopaedic Surgery

## 2017-09-28 DIAGNOSIS — F4323 Adjustment disorder with mixed anxiety and depressed mood: Secondary | ICD-10-CM | POA: Diagnosis not present

## 2017-09-28 DIAGNOSIS — S42301S Unspecified fracture of shaft of humerus, right arm, sequela: Secondary | ICD-10-CM

## 2017-09-28 NOTE — Progress Notes (Signed)
Patient is one-week status post ORIF right humeral shaft fracture.  His pain is well controlled.  He takes Advil for pain.  Incision is clean dry and intact without any signs of infection.  Neurovascularly intact distally.  X-rays show stable fixation and alignment without complication.  I would like to see him back next week for suture removal.

## 2017-09-29 ENCOUNTER — Telehealth: Payer: Self-pay

## 2017-09-29 ENCOUNTER — Encounter: Payer: Self-pay | Admitting: Family Medicine

## 2017-09-29 ENCOUNTER — Ambulatory Visit (INDEPENDENT_AMBULATORY_CARE_PROVIDER_SITE_OTHER): Payer: Medicare Other | Admitting: Family Medicine

## 2017-09-29 VITALS — BP 136/78 | HR 58 | Temp 97.8°F | Wt 216.5 lb

## 2017-09-29 DIAGNOSIS — I480 Paroxysmal atrial fibrillation: Secondary | ICD-10-CM | POA: Diagnosis not present

## 2017-09-29 DIAGNOSIS — E119 Type 2 diabetes mellitus without complications: Secondary | ICD-10-CM | POA: Diagnosis not present

## 2017-09-29 DIAGNOSIS — Z23 Encounter for immunization: Secondary | ICD-10-CM | POA: Diagnosis not present

## 2017-09-29 DIAGNOSIS — F331 Major depressive disorder, recurrent, moderate: Secondary | ICD-10-CM

## 2017-09-29 DIAGNOSIS — S42301S Unspecified fracture of shaft of humerus, right arm, sequela: Secondary | ICD-10-CM

## 2017-09-29 LAB — MICROALBUMIN / CREATININE URINE RATIO
Creatinine,U: 185.1 mg/dL
MICROALB UR: 0.9 mg/dL (ref 0.0–1.9)
Microalb Creat Ratio: 0.5 mg/g (ref 0.0–30.0)

## 2017-09-29 MED ORDER — ATORVASTATIN CALCIUM 40 MG PO TABS: 40.0000 mg | ORAL_TABLET | Freq: Every day | ORAL | 3 refills | Status: DC

## 2017-09-29 MED ORDER — METOPROLOL SUCCINATE ER 50 MG PO TB24: 50.0000 mg | ORAL_TABLET | Freq: Every day | ORAL | 3 refills | Status: DC

## 2017-09-29 MED ORDER — SERTRALINE HCL 100 MG PO TABS: 100.0000 mg | ORAL_TABLET | Freq: Every day | ORAL | 1 refills | Status: DC

## 2017-09-29 MED ORDER — GLUCOSE BLOOD VI STRP: ORAL_STRIP | 3 refills | Status: DC

## 2017-09-29 NOTE — Assessment & Plan Note (Addendum)
Chronic, deteriorated. Will increase sertraline to 100mg  daily. Pt will update me with effect after 1 month, reassess at f/u visit 3 months denies SI/HI.

## 2017-09-29 NOTE — Patient Instructions (Addendum)
Urine test today. Schedule eye exam as you're due.  Increase sertraline to 100mg  daily. Ok to hold metformin and we will monitor sugars off medication. Continue following diabetic diet.  Good to see you today, return as needed or in 4 months for welcome to medicare visit.  Flu shot today.

## 2017-09-29 NOTE — Assessment & Plan Note (Signed)
Sounds regular. He has run out of eliquis, planned to get refill in ~1 wk from mail order.

## 2017-09-29 NOTE — Assessment & Plan Note (Signed)
Chronic, stable off metformin - will continue to monitor with diabetic diet.  Reassess at f/u visit.

## 2017-09-29 NOTE — Addendum Note (Signed)
Addended by: Nanci PinaGOINS, Marshall Kampf on: 09/29/2017 08:52 AM   Modules accepted: Orders

## 2017-09-29 NOTE — Assessment & Plan Note (Signed)
Appreciate ortho care - seems to be healing well after 2nd fracture.

## 2017-09-29 NOTE — Progress Notes (Signed)
BP 136/78 (BP Location: Left Arm, Patient Position: Sitting, Cuff Size: Normal)   Pulse (!) 58   Temp 97.8 F (36.6 C) (Oral)   Wt 216 lb 8 oz (98.2 kg)   SpO2 96%   BMI 30.20 kg/m    CC: f/u visit Subjective:    Patient ID: Jonathon Middleton, male    DOB: 1951/11/30, 66 y.o.   MRN: [id number]  HPI: Jonathon Middleton is a 66 y.o. male presenting on 09/29/2017 for Medication refill (Wants to discuss metformin and sertraline)   Last seen 06/2016.  Has received medicare.   DM - overdue for f/u - labs today. On metformin 500mg  once daily - ran out "for a while". Sugars running 102-105 fasting in the mornings. Managing with diabetic diet. Eye exam: overdue Lab Results  Component Value Date   HGBA1C 6.1 (H) 08/25/2017    Afib - followed by cardiology. On amiodarone and lipitor. Off eliquis for the past few weeks. Planning to restart in the next week.   Depression - sees Salomon Fick regularly. He has noticed worsening depression despite sertraline 50mg  daily - has been on this for the past few years. Difficult year. Trouble sleeping, increased appetite. No SI/HI.   Recent ORIF R humeral shaft fracture x2 - s/p re-injury and displacement of fracture with breakage of initial hardware.   Relevant past medical, surgical, family and social history reviewed and updated as indicated. Interim medical history since our last visit reviewed. Allergies and medications reviewed and updated. Outpatient Medications Prior to Visit  Medication Sig Dispense Refill  . amiodarone (PACERONE) 200 MG tablet Take 1 tablet (200 mg total) by mouth daily. 180 tablet 3  . apixaban (ELIQUIS) 5 MG TABS tablet Take 1 tablet (5 mg total) by mouth 2 (two) times daily. 180 tablet 3  . Cholecalciferol (VITAMIN D) 2000 units CAPS Take 2,000 Units by mouth 2 (two) times daily.     . Coenzyme Q10 (CO Q-10) 100 MG CAPS Take 100 mg by mouth daily with breakfast.     . diltiazem (CARDIZEM CD) 120 MG 24 hr capsule Take 1  capsule (120 mg total) by mouth daily. (Patient taking differently: Take 120 mg by mouth at bedtime. ) 90 capsule 3  . ibuprofen (ADVIL,MOTRIN) 200 MG tablet Take 400 mg by mouth every 8 (eight) hours as needed (for pain.).    Marland Kitchen Lancets (ONETOUCH ULTRASOFT) lancets USE AS INSTRUCTED 100 each 0  . Melatonin 10 MG TABS Take 10 mg by mouth at bedtime.     . Multiple Vitamin (MULTIVITAMIN WITH MINERALS) TABS tablet Take 1 tablet by mouth daily with breakfast.    . omega-3 fish oil (MAXEPA) 1000 MG CAPS capsule Take 1 capsule by mouth 2 (two) times daily.     Marland Kitchen atorvastatin (LIPITOR) 40 MG tablet TAKE 1 TABLET BY MOUTH AT  BEDTIME 90 tablet 0  . calcium-vitamin D (OSCAL WITH D) 500-200 MG-UNIT tablet Take 1 tablet by mouth 3 (three) times daily. 90 tablet 12  . glucose blood (ONE TOUCH ULTRA TEST) test strip Use to check sugar once daily and as needed. Dx: E11.9 100 each 3  . metFORMIN (GLUCOPHAGE) 500 MG tablet TAKE 1 TABLET BY MOUTH  DAILY WITH BREAKFAST 90 tablet 1  . methocarbamol (ROBAXIN) 750 MG tablet Take 1 tablet (750 mg total) by mouth 2 (two) times daily as needed for muscle spasms. 60 tablet 0  . metoprolol succinate (TOPROL-XL) 50 MG 24 hr tablet Take 1 tablet (50 mg  total) by mouth daily. NEEDS OFFICE VISIT (Patient taking differently: Take 50 mg by mouth at bedtime. NEEDS OFFICE VISIT) 90 tablet 0  . ondansetron (ZOFRAN) 4 MG tablet Take 1-2 tablets (4-8 mg total) by mouth every 8 (eight) hours as needed for nausea or vomiting. 40 tablet 0  . oxyCODONE (OXY IR/ROXICODONE) 5 MG immediate release tablet Take 1-3 tablets (5-15 mg total) by mouth every 4 (four) hours as needed. 50 tablet 0  . oxyCODONE (OXYCONTIN) 10 mg 12 hr tablet Take 1 tablet (10 mg total) by mouth every 12 (twelve) hours. 10 tablet 0  . oxyCODONE (ROXICODONE) 5 MG immediate release tablet Take 1 tablet (5 mg total) by mouth every 8 (eight) hours as needed. 15 tablet 0  . oxyCODONE-acetaminophen (PERCOCET) 5-325 MG tablet  Take 1-2 tablets by mouth every 4 (four) hours as needed for severe pain. 30 tablet 0  . promethazine (PHENERGAN) 25 MG tablet Take 1 tablet (25 mg total) by mouth every 6 (six) hours as needed for nausea. 30 tablet 1  . senna-docusate (SENOKOT S) 8.6-50 MG tablet Take 1 tablet by mouth at bedtime as needed. 30 tablet 1  . senna-docusate (SENOKOT S) 8.6-50 MG tablet Take 1 tablet by mouth at bedtime as needed. 30 tablet 1  . sertraline (ZOLOFT) 50 MG tablet TAKE 1 TABLET BY MOUTH  DAILY (Patient taking differently: TAKE 1 TABLET BY MOUTH  DAILY AT BEDTIME.) 90 tablet 0  . zinc sulfate 220 (50 Zn) MG capsule Take 1 capsule (220 mg total) by mouth daily. 42 capsule 0   No facility-administered medications prior to visit.      Per HPI unless specifically indicated in ROS section below Review of Systems     Objective:    BP 136/78 (BP Location: Left Arm, Patient Position: Sitting, Cuff Size: Normal)   Pulse (!) 58   Temp 97.8 F (36.6 C) (Oral)   Wt 216 lb 8 oz (98.2 kg)   SpO2 96%   BMI 30.20 kg/m   Wt Readings from Last 3 Encounters:  09/29/17 216 lb 8 oz (98.2 kg)  09/21/17 209 lb (94.8 kg)  08/25/17 213 lb (96.6 kg)    Physical Exam  Constitutional: He appears well-developed and well-nourished. No distress.  HENT:  Head: Normocephalic and atraumatic.  Right Ear: External ear normal.  Left Ear: External ear normal.  Nose: Nose normal.  Mouth/Throat: Oropharynx is clear and moist. No oropharyngeal exudate.  Eyes: Pupils are equal, round, and reactive to light. Conjunctivae and EOM are normal. No scleral icterus.  Neck: Normal range of motion. Neck supple.  Cardiovascular: Normal rate, regular rhythm, normal heart sounds and intact distal pulses.   No murmur heard. Pulmonary/Chest: Effort normal and breath sounds normal. No respiratory distress. He has no wheezes. He has no rales.  Musculoskeletal: He exhibits no edema.  See HPI for foot exam if done  Lymphadenopathy:    He  has no cervical adenopathy.  Skin: Skin is warm and dry. No rash noted.  Psychiatric: He has a normal mood and affect.  Nursing note and vitals reviewed.  Results for orders placed or performed during the hospital encounter of 09/21/17  Glucose, capillary  Result Value Ref Range   Glucose-Capillary 98 65 - 99 mg/dL   Comment 1 Notify RN    Comment 2 Document in Chart   CBC  Result Value Ref Range   WBC 4.8 4.0 - 10.5 K/uL   RBC 4.36 4.22 - 5.81 MIL/uL  Hemoglobin 12.6 (L) 13.0 - 17.0 g/dL   HCT 16.139.5 09.639.0 - 04.552.0 %   MCV 90.6 78.0 - 100.0 fL   MCH 28.9 26.0 - 34.0 pg   MCHC 31.9 30.0 - 36.0 g/dL   RDW 40.914.6 81.111.5 - 91.415.5 %   Platelets 259 150 - 400 K/uL  Basic metabolic panel  Result Value Ref Range   Sodium 137 135 - 145 mmol/L   Potassium 4.0 3.5 - 5.1 mmol/L   Chloride 103 101 - 111 mmol/L   CO2 25 22 - 32 mmol/L   Glucose, Bld 90 65 - 99 mg/dL   BUN 19 6 - 20 mg/dL   Creatinine, Ser 7.821.08 0.61 - 1.24 mg/dL   Calcium 8.4 (L) 8.9 - 10.3 mg/dL   GFR calc non Af Amer >60 >60 mL/min   GFR calc Af Amer >60 >60 mL/min   Anion gap 9 5 - 15  Protime-INR  Result Value Ref Range   Prothrombin Time 13.3 11.4 - 15.2 seconds   INR 1.02   Glucose, capillary  Result Value Ref Range   Glucose-Capillary 105 (H) 65 - 99 mg/dL      Assessment & Plan:   Problem List Items Addressed This Visit    Diabetes mellitus type 2, controlled, without complications (HCC) - Primary    Chronic, stable off metformin - will continue to monitor with diabetic diet.  Reassess at f/u visit.       Relevant Medications   atorvastatin (LIPITOR) 40 MG tablet   Other Relevant Orders   Microalbumin / creatinine urine ratio   MDD (major depressive disorder), recurrent episode (HCC)    Chronic, deteriorated. Will increase sertraline to 100mg  daily. Pt will update me with effect after 1 month, reassess at f/u visit 3 months denies SI/HI.       Relevant Medications   sertraline (ZOLOFT) 100 MG tablet    Paroxysmal atrial fibrillation (HCC)    Sounds regular. He has run out of eliquis, planned to get refill in ~1 wk from mail order.       Relevant Medications   metoprolol succinate (TOPROL-XL) 50 MG 24 hr tablet   atorvastatin (LIPITOR) 40 MG tablet   Traumatic closed displaced fracture of shaft of right humerus, sequela    Appreciate ortho care - seems to be healing well after 2nd fracture.           Follow up plan: Return in about 4 months (around 01/30/2018) for medicare wellness visit.  Eustaquio BoydenJavier Ariq Khamis, MD

## 2017-09-29 NOTE — Telephone Encounter (Signed)
error 

## 2017-10-05 ENCOUNTER — Encounter (INDEPENDENT_AMBULATORY_CARE_PROVIDER_SITE_OTHER): Payer: Self-pay | Admitting: Orthopaedic Surgery

## 2017-10-05 ENCOUNTER — Ambulatory Visit (INDEPENDENT_AMBULATORY_CARE_PROVIDER_SITE_OTHER): Payer: Medicare Other

## 2017-10-05 ENCOUNTER — Ambulatory Visit (INDEPENDENT_AMBULATORY_CARE_PROVIDER_SITE_OTHER): Payer: Medicare Other | Admitting: Orthopaedic Surgery

## 2017-10-05 DIAGNOSIS — S42301S Unspecified fracture of shaft of humerus, right arm, sequela: Secondary | ICD-10-CM

## 2017-10-05 MED ORDER — SULFAMETHOXAZOLE-TRIMETHOPRIM 800-160 MG PO TABS: 1.0000 | ORAL_TABLET | Freq: Two times a day (BID) | ORAL | 0 refills | Status: DC

## 2017-10-05 MED ORDER — HYDROCODONE-ACETAMINOPHEN 5-325 MG PO TABS: 1.0000 | ORAL_TABLET | Freq: Every day | ORAL | 0 refills | Status: DC | PRN

## 2017-10-05 NOTE — Progress Notes (Signed)
Patient is two-week status post revision ORIF of right humeral fracture.  He denies any significant pain.  He does take oxycodone at night which has been causing him a lot of itching.  He has been scratching this area.  He denies any constitutional symptoms.  Incision is healing without any signs of infection.  He does have significant redness around the incision.  There is no fluctuance or drainage.  Neurovascular intact.  X-rays show stable fixation and alignment with callus formation.  The sutures were removed today.  Continue nonweightbearing.  He may wean his sling as tolerated.  I think is best to put him on 2 weeks of Bactrim in case this is a cellulitis and not just a dermatitis from the oxycodone.  We will switch him to Norco.  Follow-up in 4 weeks with 2 view x-rays of the right humerus.  We talked about the warning signs of infection.  He understands he needs to see someone immediately if there is any concern for infection.

## 2017-10-10 ENCOUNTER — Ambulatory Visit (INDEPENDENT_AMBULATORY_CARE_PROVIDER_SITE_OTHER): Payer: Medicare Other | Admitting: Orthopaedic Surgery

## 2017-10-10 ENCOUNTER — Encounter (INDEPENDENT_AMBULATORY_CARE_PROVIDER_SITE_OTHER): Payer: Self-pay

## 2017-10-12 ENCOUNTER — Ambulatory Visit (INDEPENDENT_AMBULATORY_CARE_PROVIDER_SITE_OTHER): Payer: Medicare Other | Admitting: Psychology

## 2017-10-12 DIAGNOSIS — F4323 Adjustment disorder with mixed anxiety and depressed mood: Secondary | ICD-10-CM

## 2017-10-24 ENCOUNTER — Other Ambulatory Visit: Payer: Self-pay

## 2017-10-24 MED ORDER — DILTIAZEM HCL ER COATED BEADS 120 MG PO CP24: 120.0000 mg | ORAL_CAPSULE | Freq: Every day | ORAL | 3 refills | Status: DC

## 2017-10-30 ENCOUNTER — Ambulatory Visit (INDEPENDENT_AMBULATORY_CARE_PROVIDER_SITE_OTHER): Payer: Medicare Other | Admitting: Psychology

## 2017-10-30 ENCOUNTER — Ambulatory Visit: Payer: Medicare Other | Admitting: Psychology

## 2017-10-30 DIAGNOSIS — F4323 Adjustment disorder with mixed anxiety and depressed mood: Secondary | ICD-10-CM | POA: Diagnosis not present

## 2017-11-02 ENCOUNTER — Encounter (INDEPENDENT_AMBULATORY_CARE_PROVIDER_SITE_OTHER): Payer: Self-pay | Admitting: Orthopaedic Surgery

## 2017-11-02 ENCOUNTER — Ambulatory Visit (INDEPENDENT_AMBULATORY_CARE_PROVIDER_SITE_OTHER): Payer: Medicare Other | Admitting: Orthopaedic Surgery

## 2017-11-02 ENCOUNTER — Ambulatory Visit (INDEPENDENT_AMBULATORY_CARE_PROVIDER_SITE_OTHER): Payer: Medicare Other

## 2017-11-02 DIAGNOSIS — S42301S Unspecified fracture of shaft of humerus, right arm, sequela: Secondary | ICD-10-CM

## 2017-11-02 NOTE — Progress Notes (Signed)
Patient is 6 weeks status post revision ORIF right humerus fracture with breakage of the plate.  He is doing well.  He does endorse some discomfort and weakness of his right shoulder.  Surgical scar is fully healed.  He does have some dark discoloration around the incision.  There is no fluctuance.  He does have some tethering of the scar cutaneous layer.  X-rays demonstrate improved in appearance without any complication.  At this point increase activity as tolerated.  No lifting more than 10 pounds.  Physical therapy for mobilization and strengthening and scar mobilization.  Follow-up in 6 weeks with 2 view x-rays of the right humerus.

## 2017-11-06 DIAGNOSIS — M84421D Pathological fracture, right humerus, subsequent encounter for fracture with routine healing: Secondary | ICD-10-CM | POA: Diagnosis not present

## 2017-11-06 DIAGNOSIS — M25511 Pain in right shoulder: Secondary | ICD-10-CM | POA: Diagnosis not present

## 2017-11-08 DIAGNOSIS — M25511 Pain in right shoulder: Secondary | ICD-10-CM | POA: Diagnosis not present

## 2017-11-08 DIAGNOSIS — M84421D Pathological fracture, right humerus, subsequent encounter for fracture with routine healing: Secondary | ICD-10-CM | POA: Diagnosis not present

## 2017-11-09 ENCOUNTER — Ambulatory Visit (INDEPENDENT_AMBULATORY_CARE_PROVIDER_SITE_OTHER): Payer: Commercial Managed Care - PPO | Admitting: Psychology

## 2017-11-09 DIAGNOSIS — F4323 Adjustment disorder with mixed anxiety and depressed mood: Secondary | ICD-10-CM | POA: Diagnosis not present

## 2017-11-15 DIAGNOSIS — M84421D Pathological fracture, right humerus, subsequent encounter for fracture with routine healing: Secondary | ICD-10-CM | POA: Diagnosis not present

## 2017-11-15 DIAGNOSIS — M25511 Pain in right shoulder: Secondary | ICD-10-CM | POA: Diagnosis not present

## 2017-11-20 DIAGNOSIS — M84421D Pathological fracture, right humerus, subsequent encounter for fracture with routine healing: Secondary | ICD-10-CM | POA: Diagnosis not present

## 2017-11-20 DIAGNOSIS — M25511 Pain in right shoulder: Secondary | ICD-10-CM | POA: Diagnosis not present

## 2017-11-22 DIAGNOSIS — M84421D Pathological fracture, right humerus, subsequent encounter for fracture with routine healing: Secondary | ICD-10-CM | POA: Diagnosis not present

## 2017-11-22 DIAGNOSIS — M25511 Pain in right shoulder: Secondary | ICD-10-CM | POA: Diagnosis not present

## 2017-11-23 ENCOUNTER — Ambulatory Visit (INDEPENDENT_AMBULATORY_CARE_PROVIDER_SITE_OTHER): Payer: Commercial Managed Care - PPO | Admitting: Psychology

## 2017-11-23 DIAGNOSIS — F4323 Adjustment disorder with mixed anxiety and depressed mood: Secondary | ICD-10-CM | POA: Diagnosis not present

## 2017-11-27 DIAGNOSIS — M25511 Pain in right shoulder: Secondary | ICD-10-CM | POA: Diagnosis not present

## 2017-11-27 DIAGNOSIS — M84421D Pathological fracture, right humerus, subsequent encounter for fracture with routine healing: Secondary | ICD-10-CM | POA: Diagnosis not present

## 2017-11-29 DIAGNOSIS — M84421D Pathological fracture, right humerus, subsequent encounter for fracture with routine healing: Secondary | ICD-10-CM | POA: Diagnosis not present

## 2017-11-29 DIAGNOSIS — M25511 Pain in right shoulder: Secondary | ICD-10-CM | POA: Diagnosis not present

## 2017-12-04 DIAGNOSIS — M84421D Pathological fracture, right humerus, subsequent encounter for fracture with routine healing: Secondary | ICD-10-CM | POA: Diagnosis not present

## 2017-12-04 DIAGNOSIS — M25511 Pain in right shoulder: Secondary | ICD-10-CM | POA: Diagnosis not present

## 2017-12-06 DIAGNOSIS — M25511 Pain in right shoulder: Secondary | ICD-10-CM | POA: Diagnosis not present

## 2017-12-06 DIAGNOSIS — M84421D Pathological fracture, right humerus, subsequent encounter for fracture with routine healing: Secondary | ICD-10-CM | POA: Diagnosis not present

## 2017-12-07 ENCOUNTER — Ambulatory Visit (INDEPENDENT_AMBULATORY_CARE_PROVIDER_SITE_OTHER): Payer: Medicare Other | Admitting: Psychology

## 2017-12-07 DIAGNOSIS — F4323 Adjustment disorder with mixed anxiety and depressed mood: Secondary | ICD-10-CM | POA: Diagnosis not present

## 2017-12-11 DIAGNOSIS — M84421D Pathological fracture, right humerus, subsequent encounter for fracture with routine healing: Secondary | ICD-10-CM | POA: Diagnosis not present

## 2017-12-11 DIAGNOSIS — M25511 Pain in right shoulder: Secondary | ICD-10-CM | POA: Diagnosis not present

## 2017-12-13 DIAGNOSIS — M25511 Pain in right shoulder: Secondary | ICD-10-CM | POA: Diagnosis not present

## 2017-12-13 DIAGNOSIS — M84421D Pathological fracture, right humerus, subsequent encounter for fracture with routine healing: Secondary | ICD-10-CM | POA: Diagnosis not present

## 2017-12-14 ENCOUNTER — Encounter (INDEPENDENT_AMBULATORY_CARE_PROVIDER_SITE_OTHER): Payer: Self-pay | Admitting: Orthopaedic Surgery

## 2017-12-14 ENCOUNTER — Ambulatory Visit (INDEPENDENT_AMBULATORY_CARE_PROVIDER_SITE_OTHER): Payer: PRIVATE HEALTH INSURANCE

## 2017-12-14 ENCOUNTER — Ambulatory Visit (INDEPENDENT_AMBULATORY_CARE_PROVIDER_SITE_OTHER): Payer: PRIVATE HEALTH INSURANCE | Admitting: Orthopaedic Surgery

## 2017-12-14 DIAGNOSIS — S42301S Unspecified fracture of shaft of humerus, right arm, sequela: Secondary | ICD-10-CM

## 2017-12-14 NOTE — Progress Notes (Signed)
Patient is 84 days status post revision ORIF of right humeral shaft fracture from periprosthetic breakage.  He comes in today he is doing well.  He is progressing with physical therapy.  His surgical scar is fully healed.  He does have significantly improved range of motion and strength.  His x-rays do demonstrate improvement of the fracture with abundant callus formation and bony consolidation.  At this point he may rake leaves and increase activity as tolerated.  I do not want him to do any push-ups or any heavy resistance exercises for now.  Follow-up in 6 weeks for 2 view x-rays of the right humerus.

## 2017-12-19 DIAGNOSIS — M25511 Pain in right shoulder: Secondary | ICD-10-CM | POA: Diagnosis not present

## 2017-12-19 DIAGNOSIS — M84421D Pathological fracture, right humerus, subsequent encounter for fracture with routine healing: Secondary | ICD-10-CM | POA: Diagnosis not present

## 2017-12-21 ENCOUNTER — Ambulatory Visit (INDEPENDENT_AMBULATORY_CARE_PROVIDER_SITE_OTHER): Payer: Medicare Other | Admitting: Psychology

## 2017-12-21 DIAGNOSIS — F4323 Adjustment disorder with mixed anxiety and depressed mood: Secondary | ICD-10-CM

## 2017-12-21 DIAGNOSIS — M25511 Pain in right shoulder: Secondary | ICD-10-CM | POA: Diagnosis not present

## 2017-12-21 DIAGNOSIS — M84421D Pathological fracture, right humerus, subsequent encounter for fracture with routine healing: Secondary | ICD-10-CM | POA: Diagnosis not present

## 2017-12-26 DIAGNOSIS — M84421D Pathological fracture, right humerus, subsequent encounter for fracture with routine healing: Secondary | ICD-10-CM | POA: Diagnosis not present

## 2017-12-26 DIAGNOSIS — M25511 Pain in right shoulder: Secondary | ICD-10-CM | POA: Diagnosis not present

## 2017-12-29 DIAGNOSIS — M25511 Pain in right shoulder: Secondary | ICD-10-CM | POA: Diagnosis not present

## 2017-12-29 DIAGNOSIS — M84421D Pathological fracture, right humerus, subsequent encounter for fracture with routine healing: Secondary | ICD-10-CM | POA: Diagnosis not present

## 2018-01-02 DIAGNOSIS — M25511 Pain in right shoulder: Secondary | ICD-10-CM | POA: Diagnosis not present

## 2018-01-02 DIAGNOSIS — M84421D Pathological fracture, right humerus, subsequent encounter for fracture with routine healing: Secondary | ICD-10-CM | POA: Diagnosis not present

## 2018-01-04 ENCOUNTER — Ambulatory Visit (INDEPENDENT_AMBULATORY_CARE_PROVIDER_SITE_OTHER): Payer: Medicare Other | Admitting: Psychology

## 2018-01-04 DIAGNOSIS — F4323 Adjustment disorder with mixed anxiety and depressed mood: Secondary | ICD-10-CM

## 2018-01-05 DIAGNOSIS — M84421D Pathological fracture, right humerus, subsequent encounter for fracture with routine healing: Secondary | ICD-10-CM | POA: Diagnosis not present

## 2018-01-05 DIAGNOSIS — M25511 Pain in right shoulder: Secondary | ICD-10-CM | POA: Diagnosis not present

## 2018-01-09 DIAGNOSIS — M25511 Pain in right shoulder: Secondary | ICD-10-CM | POA: Diagnosis not present

## 2018-01-09 DIAGNOSIS — M84421D Pathological fracture, right humerus, subsequent encounter for fracture with routine healing: Secondary | ICD-10-CM | POA: Diagnosis not present

## 2018-01-12 DIAGNOSIS — M25511 Pain in right shoulder: Secondary | ICD-10-CM | POA: Diagnosis not present

## 2018-01-12 DIAGNOSIS — M84421D Pathological fracture, right humerus, subsequent encounter for fracture with routine healing: Secondary | ICD-10-CM | POA: Diagnosis not present

## 2018-01-16 DIAGNOSIS — M25511 Pain in right shoulder: Secondary | ICD-10-CM | POA: Diagnosis not present

## 2018-01-16 DIAGNOSIS — M84421D Pathological fracture, right humerus, subsequent encounter for fracture with routine healing: Secondary | ICD-10-CM | POA: Diagnosis not present

## 2018-01-18 ENCOUNTER — Ambulatory Visit (INDEPENDENT_AMBULATORY_CARE_PROVIDER_SITE_OTHER): Payer: Medicare Other | Admitting: Psychology

## 2018-01-18 DIAGNOSIS — M25511 Pain in right shoulder: Secondary | ICD-10-CM | POA: Diagnosis not present

## 2018-01-18 DIAGNOSIS — F4323 Adjustment disorder with mixed anxiety and depressed mood: Secondary | ICD-10-CM

## 2018-01-18 DIAGNOSIS — M84421D Pathological fracture, right humerus, subsequent encounter for fracture with routine healing: Secondary | ICD-10-CM | POA: Diagnosis not present

## 2018-01-22 DIAGNOSIS — M25511 Pain in right shoulder: Secondary | ICD-10-CM | POA: Diagnosis not present

## 2018-01-22 DIAGNOSIS — M84421D Pathological fracture, right humerus, subsequent encounter for fracture with routine healing: Secondary | ICD-10-CM | POA: Diagnosis not present

## 2018-01-24 DIAGNOSIS — M84421D Pathological fracture, right humerus, subsequent encounter for fracture with routine healing: Secondary | ICD-10-CM | POA: Diagnosis not present

## 2018-01-24 DIAGNOSIS — M25511 Pain in right shoulder: Secondary | ICD-10-CM | POA: Diagnosis not present

## 2018-01-25 ENCOUNTER — Encounter (INDEPENDENT_AMBULATORY_CARE_PROVIDER_SITE_OTHER): Payer: Self-pay | Admitting: Orthopaedic Surgery

## 2018-01-25 ENCOUNTER — Ambulatory Visit (INDEPENDENT_AMBULATORY_CARE_PROVIDER_SITE_OTHER): Payer: PRIVATE HEALTH INSURANCE

## 2018-01-25 ENCOUNTER — Ambulatory Visit (INDEPENDENT_AMBULATORY_CARE_PROVIDER_SITE_OTHER): Payer: PRIVATE HEALTH INSURANCE | Admitting: Orthopaedic Surgery

## 2018-01-25 DIAGNOSIS — M79621 Pain in right upper arm: Secondary | ICD-10-CM

## 2018-01-25 DIAGNOSIS — S42301S Unspecified fracture of shaft of humerus, right arm, sequela: Secondary | ICD-10-CM | POA: Diagnosis not present

## 2018-01-25 DIAGNOSIS — M898X2 Other specified disorders of bone, upper arm: Secondary | ICD-10-CM

## 2018-01-25 NOTE — Progress Notes (Signed)
Post-Op Visit Note   Patient: Jonathon Middleton           Date of Birth: 1951-03-28           MRN: [id number] Visit Date: 01/25/2018 PCP: Eustaquio Boyden, MD   Assessment & Plan:  Chief Complaint:  Chief Complaint  Patient presents with  . Right Upper Arm - Follow-up   Visit Diagnoses:  1. Traumatic closed displaced fracture of shaft of right humerus, sequela   2. Pain of right humerus     Plan: [name] comes in for follow-up.  He is 4 months status post ORIF right humeral shaft periprosthetic fracture.  Date of surgery, 09/21/2017.  He has been in outpatient physical therapy making great progress.  Still lacking a little with internal rotation, however.  No pain.  Examination of his right shoulder reveals full forward flexion abduction.  He can internally rotate to L5.  He is neurovascular intact distally.  No pain over the humeral shaft.  At this point, we will decrease the frequency of physical therapy to once a week for them to check on his progress at home.  He will follow-up with [place] in 2 months time for repeat evaluation and 2 view x-rays.  In the meantime, I have reinforced to him that he needs to avoid lifting more than 20-25 pounds.  I have also reinforced him that he needs to avoid doing push-ups and pull-ups.  Follow-Up Instructions: Return in about 8 weeks (around 03/22/2018).   Orders:  Orders Placed This Encounter  Procedures  . XR Humerus Right   No orders of the defined types were placed in this encounter.   Imaging: Xr Humerus Right  Result Date: 01/25/2018 Healing humeral shaft fracture with stable alignment   PMFS History: Patient Active Problem List   Diagnosis Date Noted  . Traumatic closed displaced fracture of shaft of right humerus, sequela 08/22/2017  . Elevated PSA 06/27/2016  . Adjustment disorder with depressed mood 06/27/2016  . Health maintenance examination 04/17/2014  . Obesity, Class I, BMI 30-34.9 04/17/2014  . Nonalcoholic fatty liver  disease   . GERD (gastroesophageal reflux disease)   . Paroxysmal atrial fibrillation (HCC) 09/16/2013  . Hyperlipidemia 09/16/2013  . Essential hypertension 09/16/2013  . Smoking history 09/16/2013  . MDD (major depressive disorder), recurrent episode (HCC) 09/16/2013  . Diabetes mellitus type 2, controlled, without complications (HCC) 09/16/2013   Past Medical History:  Diagnosis Date  . Arthritis   . Atrial fibrillation (HCC)   . Depression   . Depression with anxiety   . GERD (gastroesophageal reflux disease)    08/24/18- not current  . History of chicken pox   . Humerus fracture    x 2  . Hyperlipidemia   . Hypertension   . Nonalcoholic fatty liver disease    ?h/o elevated LFTs, has not had [place]  . Pre-diabetes    Metobolic Syndrome  . Prediabetes    treated with diet & exercise. saw [facility] nutritionist 04/2014    Family History  Problem Relation Age of Onset  . Hypertension Mother   . CAD Father 80       massive MI, deceased  . CAD Paternal Uncle 4       MI, smoker  . Stroke Paternal Aunt   . Diabetes Paternal Grandmother   . Cancer Maternal Uncle        lung cancer (non small cell), smoker    Past Surgical History:  Procedure Laterality Date  . COLONOSCOPY    .  HARDWARE REMOVAL Right 09/21/2017   Procedure: HARDWARE REMOVAL OF RIGHT HUMERUS;  Surgeon: Tarry KosXu, Naiping M, MD;  Location: [facility] OR;  Service: Orthopedics;  Laterality: Right;  . KNEE SURGERY Right 2009   x 2 (Dr. Ernest PineHooten)  Arthroscopy- torn MCL  . ORIF HUMERUS FRACTURE Right 08/25/2017   Procedure: OPEN REDUCTION INTERNAL FIXATION (ORIF) RIGHT HUMERAL SHAFT FRACTURE;  Surgeon: Tarry KosXu, Naiping M, MD;  Location: [facility] OR;  Service: Orthopedics;  Laterality: Right;  . ORIF HUMERUS FRACTURE Right 09/21/2017   Procedure: OPEN REDUCTION INTERNAL FIXATION (ORIF) RIGHT HUMERAL SHAFT FRACTURE;  Surgeon: Tarry KosXu, Naiping M, MD;  Location: [facility] OR;  Service: Orthopedics;  Laterality: Right;   Social History   Occupational History    . Not on file  Tobacco Use  . Smoking status: Former Smoker    Packs/day: 1.00    Years: 5.00    Pack years: 5.00    Types: Cigars  . Smokeless tobacco: Never Used  . Tobacco comment: ocassional last time 03/2017  Substance and Sexual Activity  . Alcohol use: Yes    Alcohol/week: 1.2 oz    Types: 2 Cans of beer per week    Comment: last use August 25, 2017  . Drug use: No  . Sexual activity: Not on file

## 2018-02-01 ENCOUNTER — Ambulatory Visit (INDEPENDENT_AMBULATORY_CARE_PROVIDER_SITE_OTHER): Payer: Medicare Other | Admitting: Psychology

## 2018-02-01 DIAGNOSIS — F4323 Adjustment disorder with mixed anxiety and depressed mood: Secondary | ICD-10-CM | POA: Diagnosis not present

## 2018-02-15 ENCOUNTER — Ambulatory Visit (INDEPENDENT_AMBULATORY_CARE_PROVIDER_SITE_OTHER): Payer: Medicare Other | Admitting: Psychology

## 2018-02-15 DIAGNOSIS — F4323 Adjustment disorder with mixed anxiety and depressed mood: Secondary | ICD-10-CM

## 2018-03-01 ENCOUNTER — Ambulatory Visit (INDEPENDENT_AMBULATORY_CARE_PROVIDER_SITE_OTHER): Payer: Medicare Other | Admitting: Psychology

## 2018-03-01 DIAGNOSIS — F4323 Adjustment disorder with mixed anxiety and depressed mood: Secondary | ICD-10-CM | POA: Diagnosis not present

## 2018-03-15 ENCOUNTER — Ambulatory Visit (INDEPENDENT_AMBULATORY_CARE_PROVIDER_SITE_OTHER): Payer: Medicare Other | Admitting: Psychology

## 2018-03-15 DIAGNOSIS — F4323 Adjustment disorder with mixed anxiety and depressed mood: Secondary | ICD-10-CM | POA: Diagnosis not present

## 2018-03-22 ENCOUNTER — Ambulatory Visit (INDEPENDENT_AMBULATORY_CARE_PROVIDER_SITE_OTHER): Payer: PRIVATE HEALTH INSURANCE

## 2018-03-22 ENCOUNTER — Other Ambulatory Visit: Payer: Self-pay | Admitting: Family Medicine

## 2018-03-22 ENCOUNTER — Ambulatory Visit (INDEPENDENT_AMBULATORY_CARE_PROVIDER_SITE_OTHER): Payer: PRIVATE HEALTH INSURANCE | Admitting: Orthopaedic Surgery

## 2018-03-22 DIAGNOSIS — S42301S Unspecified fracture of shaft of humerus, right arm, sequela: Secondary | ICD-10-CM

## 2018-03-22 NOTE — Progress Notes (Signed)
Post-Op Visit Note   Patient: Jonathon Middleton           Date of Birth: [birth year]           MRN: [id number] Visit Date: 03/22/2018 PCP: Eustaquio BoydenGutierrez, Javier, MD   Assessment & Plan:  Chief Complaint:  Chief Complaint  Patient presents with  . Right Shoulder - Routine Post Op, Follow-up   Visit Diagnoses:  1. Traumatic closed displaced fracture of shaft of right humerus, sequela    HPI: Patient is 6 months status post revision of his humeral shaft fracture from hardware failure.  Patient is doing well and has no complaints.  His surgical scar is fully healed.  He has excellent range of motion of the elbow and the shoulder.   Plan: Patient has demonstrated full healing of the fracture both radiographically and clinically.  At this point he is released to full activity.  X-rays were reviewed with the patient today.  Patient in agreement.  Follow-up as needed.  Follow-Up Instructions: Return if symptoms worsen or fail to improve.   Orders:  Orders Placed This Encounter  Procedures  . XR Humerus Right   No orders of the defined types were placed in this encounter.   Imaging: Xr Humerus Right  Result Date: 03/22/2018 Healed fracture with abundant callus formation and stable fixation.  Bridging bone callus and consolidation.   PMFS History: Patient Active Problem List   Diagnosis Date Noted  . Traumatic closed displaced fracture of shaft of right humerus, sequela 08/22/2017  . Elevated PSA 06/27/2016  . Adjustment disorder with depressed mood 06/27/2016  . Health maintenance examination 04/17/2014  . Obesity, Class I, BMI 30-34.9 04/17/2014  . Nonalcoholic fatty liver disease   . GERD (gastroesophageal reflux disease)   . Paroxysmal atrial fibrillation (HCC) 09/16/2013  . Hyperlipidemia 09/16/2013  . Essential hypertension 09/16/2013  . Smoking history 09/16/2013  . MDD (major depressive disorder), recurrent episode (HCC) 09/16/2013  . Diabetes mellitus type 2,  controlled, without complications (HCC) 09/16/2013   Past Medical History:  Diagnosis Date  . Arthritis   . Atrial fibrillation (HCC)   . Depression   . Depression with anxiety   . GERD (gastroesophageal reflux disease)    08/24/18- not current  . History of chicken pox   . Humerus fracture    x 2  . Hyperlipidemia   . Hypertension   . Nonalcoholic fatty liver disease    ?h/o elevated LFTs, has not had US  . Pre-diabetes    Metobolic Syndrome  . Prediabetes    treated with diet & exercise. [facility]    Family History  Problem Relation Age of Onset  . Hypertension Mother   . CAD Father 3842       massive MI, deceased  . CAD Paternal Uncle 559       MI, smoker  . Stroke Paternal Aunt   . Diabetes Paternal Grandmother   . Cancer Maternal Uncle        lung cancer (non small cell), smoker    Past Surgical History:  Procedure Laterality Date  . COLONOSCOPY    . HARDWARE REMOVAL Right 09/21/2017   Procedure: HARDWARE REMOVAL OF RIGHT HUMERUS;  Surgeon: Tarry KosXu, Yulitza Shorts M, MD;  Location: [facility] OR;  Service: Orthopedics;  Laterality: Right;  . KNEE SURGERY Right 2009   x 2 (Dr. Ernest PineHooten)  Arthroscopy- torn MCL  . ORIF HUMERUS FRACTURE Right 08/25/2017   Procedure: OPEN REDUCTION INTERNAL FIXATION (ORIF) RIGHT HUMERAL  SHAFT FRACTURE;  Surgeon: Tarry Kos, MD;  Location[facility];  Service: Orthopedics;  Laterality: Right;  . ORIF HUMERUS FRACTURE Right 09/21/2017   Procedure: OPEN REDUCTION INTERNAL FIXATION (ORIF) RIGHT HUMERAL SHAFT FRACTURE;  Surgeon: Tarry Kos, MD;  Location: [facility] OR;  Service: Orthopedics;  Laterality: Right;   Social History   Occupational History  . Not on file  Tobacco Use  . Smoking status: Former Smoker    Packs/day: 1.00    Years: 5.00    Pack years: 5.00    Types: Cigars  . Smokeless tobacco: Never Used  . Tobacco comment: ocassional last time 03/2017  Substance and Sexual Activity  . Alcohol use: Yes    Alcohol/week: 1.2 oz     Types: 2 Cans of beer per week    Comment: last use August 25, 2017  . Drug use: No  . Sexual activity: Not on file

## 2018-03-28 ENCOUNTER — Other Ambulatory Visit: Payer: Self-pay | Admitting: Family Medicine

## 2018-03-28 NOTE — Telephone Encounter (Signed)
I called [facility] and pt has sertraline rx on hold from 03/22/18. Nothing further needed.

## 2018-03-29 ENCOUNTER — Ambulatory Visit (INDEPENDENT_AMBULATORY_CARE_PROVIDER_SITE_OTHER): Payer: PRIVATE HEALTH INSURANCE | Admitting: Psychology

## 2018-03-29 DIAGNOSIS — F4323 Adjustment disorder with mixed anxiety and depressed mood: Secondary | ICD-10-CM | POA: Diagnosis not present

## 2018-04-03 NOTE — Progress Notes (Signed)
Cardiology Office Note  Date:  04/04/2018   ID:  Jonathon Middleton, DOB 08-08-1951, MRN [id number]  PCP:  Eustaquio Boyden, MD   Chief Complaint  Patient presents with  . Other    12 month follow up. Patient denies chest pain and SOB. Meds reviewed verbally with patient.     HPI:  Mr Jonathon Middleton is a very pleasant 67 year old gentleman with  smoking history for 5 years who stopped in 1984, PAF, 12/2012, 09/2013, 06/2016 , 07/2016, Chads vasc score of 1.  who presents for follow-up of his atrial fibrillation.  BP at home 130s/80s Today blood pressure running high in the office Thinks it was because he was rushing and eager to be here  One episode of atrial fib in Jan 2019 Took another amio <12 hrs went away Not having frequent episodes on his current regiment  Sept 2018 fell off ladder Broke humerus on the right rebroke his arm, "overdoing it" At surgery again  Weight up 15 pounds during the past several months recovering Poor diet  Previous lab work September 2018 before weight gain HBA1C 6.1 Prior cholesterol 2017 was 120   previously used to walk on treadmill  denies shortness of breath, Occasionally with hand swelling, leg swelling  Previous thyroid LFTs normal  EKG personally reviewed by myself on todays visit Shows normal sinus rhythm rate 58 bpm no significant ST or T wave changes  Chads vasc score of 1.   Other past medical history reviewed Seen by his primary care physician, Dr. Sharen Hones, on 09/19/2013, noted to be in atrial fibrillation with rate in the 140 range. He was relatively asymptomatic. rate improved back down to normal on October 18.   he had asymptomatic atrial fibrillation in January 2014. He did have some mild malaise, bloating in his chest. He was found on routine physical exam by Dr. Alison Murray to have a heart rate of 140 beats per minute. He was referred to [facility] cardiology, started on metoprolol. He had echocardiogram at that time which  was essentially normal. Also had stress test with normal treadmill   father had heart attack at age 52 and died. He was a long-time smoker. Mother lived to 18  PMH:   has a past medical history of Arthritis, Atrial fibrillation (HCC), Depression, Depression with anxiety, GERD (gastroesophageal reflux disease), History of chicken pox, Humerus fracture, Hyperlipidemia, Hypertension, Nonalcoholic fatty liver disease, Pre-diabetes, and Prediabetes.  PSH:    Past Surgical History:  Procedure Laterality Date  . COLONOSCOPY    . HARDWARE REMOVAL Right 09/21/2017   Procedure: HARDWARE REMOVAL OF RIGHT HUMERUS;  Surgeon: Tarry Kos, MD;  Location: [facility] OR;  Service: Orthopedics;  Laterality: Right;  . KNEE SURGERY Right 2009   x 2 (Dr. Ernest Pine)  Arthroscopy- torn MCL  . ORIF HUMERUS FRACTURE Right 08/25/2017   Procedure: OPEN REDUCTION INTERNAL FIXATION (ORIF) RIGHT HUMERAL SHAFT FRACTURE;  Surgeon: Tarry Kos, MD;  Location: [facility] OR;  Service: Orthopedics;  Laterality: Right;  . ORIF HUMERUS FRACTURE Right 09/21/2017   Procedure: OPEN REDUCTION INTERNAL FIXATION (ORIF) RIGHT HUMERAL SHAFT FRACTURE;  Surgeon: Tarry Kos, MD;  Location: [facility] OR;  Service: Orthopedics;  Laterality: Right;    Current Outpatient Medications  Medication Sig Dispense Refill  . amiodarone (PACERONE) 200 MG tablet Take 1 tablet (200 mg total) by mouth daily. 180 tablet 3  . apixaban (ELIQUIS) 5 MG TABS tablet Take 1 tablet (5 mg total) by mouth 2 (two) times daily. 180 tablet 3  .  atorvastatin (LIPITOR) 40 MG tablet Take 1 tablet (40 mg total) by mouth at bedtime. 90 tablet 3  . Cholecalciferol (VITAMIN D) 2000 units CAPS Take 2,000 Units by mouth 2 (two) times daily.     . Coenzyme Q10 (CO Q-10) 100 MG CAPS Take 100 mg by mouth daily with breakfast.     . diltiazem (CARDIZEM CD) 120 MG 24 hr capsule Take 1 capsule (120 mg total) daily by mouth. 90 capsule 3  . glucose blood (ONE TOUCH ULTRA TEST) test strip Use to  check sugar once daily and as needed. Dx: E11.9 100 each 3  . ibuprofen (ADVIL,MOTRIN) 200 MG tablet Take 400 mg by mouth every 8 (eight) hours as needed (for pain.).    Marland Kitchen Lancets (ONETOUCH ULTRASOFT) lancets USE AS INSTRUCTED 100 each 0  . Melatonin 10 MG TABS Take 10 mg by mouth at bedtime.     . metoprolol succinate (TOPROL-XL) 50 MG 24 hr tablet Take 1 tablet (50 mg total) by mouth daily. 90 tablet 3  . Multiple Vitamin (MULTIVITAMIN WITH MINERALS) TABS tablet Take 1 tablet by mouth daily with breakfast.    . omega-3 fish oil (MAXEPA) 1000 MG CAPS capsule Take 1 capsule by mouth 2 (two) times daily.     . sertraline (ZOLOFT) 100 MG tablet TAKE 1 TABLET BY MOUTH EVERY DAY 90 tablet 0  . furosemide (LASIX) 20 MG tablet Take 1 tablet (20 mg total) by mouth daily. 30 tablet 1   No current facility-administered medications for this visit.      Allergies:   Patient has no known allergies.   Social History:  The patient  reports that he has quit smoking. His smoking use included cigars. He has a 5.00 pack-year smoking history. He has never used smokeless tobacco. He reports that he drinks about 1.2 oz of alcohol per week. He reports that he does not use drugs.   Family History:   family history includes CAD (age of onset: 70) in his father; CAD (age of onset: 83) in his paternal uncle; Cancer in his maternal uncle; Diabetes in his paternal grandmother; Hypertension in his mother; Stroke in his paternal aunt.    Review of Systems: Review of Systems  Constitutional: Negative.        Weight gain  Respiratory: Negative.   Cardiovascular: Negative.   Gastrointestinal: Negative.   Musculoskeletal: Negative.   Neurological: Negative.   Psychiatric/Behavioral: Negative.   All other systems reviewed and are negative.    PHYSICAL EXAM: VS:  BP (!) 170/90 (BP Location: Left Arm, Patient Position: Sitting, Cuff Size: Normal)   Pulse (!) 58   Ht  (1.803 m)   Wt 224 lb (101.6 kg)   BMI  31.24 kg/m  , BMI Body mass index is 31.24 kg/m. Constitutional:  oriented to person, place, and time. No distress.  HENT:  Head: Normocephalic and atraumatic.  Eyes:  no discharge. No scleral icterus.  Neck: Normal range of motion. Neck supple. No JVD present.  Cardiovascular: Normal rate, regular rhythm, normal heart sounds and intact distal pulses. Exam reveals no gallop and no friction rub. No edema No murmur heard. Pulmonary/Chest: Effort normal and breath sounds normal. No stridor. No respiratory distress.  no wheezes.  no rales.  no tenderness.  Abdominal: Soft.  no distension.  no tenderness.  Musculoskeletal: Normal range of motion.  no  tenderness or deformity.  Neurological:  normal muscle tone. Coordination normal. No atrophy Skin: Skin is warm and dry.  No rash noted. not diaphoretic.  Psychiatric:  normal mood and affect. behavior is normal. Thought content normal.    Recent Labs: 08/25/2017: ALT 36 09/21/2017: BUN 19; Creatinine, Ser 1.08; Hemoglobin 12.6; Platelets 259; Potassium 4.0; Sodium 137    Lipid Panel Lab Results  Component Value Date   CHOL 120 06/01/2016   HDL 39.20 06/01/2016   LDLCALC 57 06/01/2016   TRIG 121.0 06/01/2016      Wt Readings from Last 3 Encounters:  04/04/18 224 lb (101.6 kg)  09/29/17 216 lb 8 oz (98.2 kg)  09/21/17 209 lb (94.8 kg)       ASSESSMENT AND PLAN:  Paroxysmal atrial fibrillation (HCC) - Plan: EKG 12-Lead Doing well rare episodes We will continue his current medication regimen Recommended weight loss Amiodarone surveillance labs with primary care this fall  Hyperlipidemia  recommended weight loss exercise,  Diet restriction Stay on Lipitor  Essential hypertension Blood pressure markedly elevated today but he reports it is relatively well controlled at home He will call us with some numbers  we would start losartan if numbers continue to run high at home  Smoking history We have encouraged him to continue  to work on weaning his cigarettes and smoking cessation. He will continue to work on this and does not want any assistance with chantix.  Again discussed with him Denied smoking  Controlled type 2 diabetes mellitus without complication, without long-term current use of insulin (HCC) Weight is up significantly Recommended less eating out more exercise dietary restriction   Total encounter time more than 25 minutes  Greater than 50% was spent in counseling and coordination of care with the patient   Disposition:   F/U  12 months   Orders Placed This Encounter  Procedures  . EKG 12-Lead     Signed, Dossie Arbour, M.D., Ph.D. 04/04/2018  [facility] [name], [place] [phone]

## 2018-04-04 ENCOUNTER — Ambulatory Visit (INDEPENDENT_AMBULATORY_CARE_PROVIDER_SITE_OTHER): Payer: Medicare Other | Admitting: Cardiovascular Disease

## 2018-04-04 ENCOUNTER — Encounter: Payer: Self-pay | Admitting: Cardiovascular Disease

## 2018-04-04 VITALS — BP 170/90 | HR 58 | Ht 71.0 in | Wt 224.0 lb

## 2018-04-04 DIAGNOSIS — I10 Essential (primary) hypertension: Secondary | ICD-10-CM

## 2018-04-04 DIAGNOSIS — E782 Mixed hyperlipidemia: Secondary | ICD-10-CM | POA: Diagnosis not present

## 2018-04-04 DIAGNOSIS — K219 Gastro-esophageal reflux disease without esophagitis: Secondary | ICD-10-CM

## 2018-04-04 DIAGNOSIS — E119 Type 2 diabetes mellitus without complications: Secondary | ICD-10-CM

## 2018-04-04 DIAGNOSIS — I480 Paroxysmal atrial fibrillation: Secondary | ICD-10-CM

## 2018-04-04 DIAGNOSIS — Z87891 Personal history of nicotine dependence: Secondary | ICD-10-CM | POA: Diagnosis not present

## 2018-04-04 MED ORDER — FUROSEMIDE 20 MG PO TABS: ORAL_TABLET | ORAL | 1 refills | Status: DC

## 2018-04-04 MED ORDER — FUROSEMIDE 20 MG PO TABS: 20.0000 mg | ORAL_TABLET | Freq: Every day | ORAL | 1 refills | Status: DC

## 2018-04-04 NOTE — Patient Instructions (Signed)
Medication Instructions:   Take lasix as needed for swelling  Labwork:  No new labs needed  Testing/Procedures:  No further testing at this time   Follow-Up: It was a pleasure seeing you in the office today. Please call us if you have new issues that need to be addressed before your next appt.  [phone]  Your physician wants you to follow-up in: 12 months.  You will receive a reminder letter in the mail two months in advance. If you don't receive a letter, please call our office to schedule the follow-up appointment.  If you need a refill on your cardiac medications before your next appointment, please call your pharmacy.  For educational health videos Log in to : [email or web address] Or : [email or web address], password : [email or web address]

## 2018-04-12 ENCOUNTER — Ambulatory Visit (INDEPENDENT_AMBULATORY_CARE_PROVIDER_SITE_OTHER): Payer: Medicare Other | Admitting: Psychology

## 2018-04-12 DIAGNOSIS — F4323 Adjustment disorder with mixed anxiety and depressed mood: Secondary | ICD-10-CM

## 2018-04-25 ENCOUNTER — Encounter: Payer: Self-pay | Admitting: Cardiovascular Disease

## 2018-04-25 ENCOUNTER — Telehealth: Payer: Self-pay | Admitting: Cardiovascular Disease

## 2018-04-25 NOTE — Telephone Encounter (Signed)
Error

## 2018-04-25 NOTE — Telephone Encounter (Signed)
Please review

## 2018-04-29 ENCOUNTER — Other Ambulatory Visit: Payer: Self-pay | Admitting: Cardiovascular Disease

## 2018-05-01 ENCOUNTER — Other Ambulatory Visit: Payer: Self-pay | Admitting: *Deleted

## 2018-05-01 MED ORDER — LOSARTAN POTASSIUM 50 MG PO TABS: 50.0000 mg | ORAL_TABLET | Freq: Every day | ORAL | 3 refills | Status: DC

## 2018-05-01 NOTE — Progress Notes (Signed)
losarta

## 2018-05-10 ENCOUNTER — Ambulatory Visit (INDEPENDENT_AMBULATORY_CARE_PROVIDER_SITE_OTHER): Payer: PRIVATE HEALTH INSURANCE | Admitting: Psychology

## 2018-05-10 DIAGNOSIS — F4323 Adjustment disorder with mixed anxiety and depressed mood: Secondary | ICD-10-CM

## 2018-05-24 ENCOUNTER — Ambulatory Visit (INDEPENDENT_AMBULATORY_CARE_PROVIDER_SITE_OTHER): Payer: PRIVATE HEALTH INSURANCE | Admitting: Psychology

## 2018-05-24 DIAGNOSIS — F4323 Adjustment disorder with mixed anxiety and depressed mood: Secondary | ICD-10-CM | POA: Diagnosis not present

## 2018-06-19 ENCOUNTER — Other Ambulatory Visit: Payer: Self-pay | Admitting: Family Medicine

## 2018-06-21 ENCOUNTER — Ambulatory Visit (INDEPENDENT_AMBULATORY_CARE_PROVIDER_SITE_OTHER): Payer: PRIVATE HEALTH INSURANCE | Admitting: Psychology

## 2018-06-21 DIAGNOSIS — F4323 Adjustment disorder with mixed anxiety and depressed mood: Secondary | ICD-10-CM

## 2018-07-05 ENCOUNTER — Ambulatory Visit (INDEPENDENT_AMBULATORY_CARE_PROVIDER_SITE_OTHER): Payer: PRIVATE HEALTH INSURANCE | Admitting: Psychology

## 2018-07-05 DIAGNOSIS — F4323 Adjustment disorder with mixed anxiety and depressed mood: Secondary | ICD-10-CM

## 2018-07-19 ENCOUNTER — Ambulatory Visit (INDEPENDENT_AMBULATORY_CARE_PROVIDER_SITE_OTHER): Payer: PRIVATE HEALTH INSURANCE | Admitting: Psychology

## 2018-07-19 DIAGNOSIS — F4323 Adjustment disorder with mixed anxiety and depressed mood: Secondary | ICD-10-CM | POA: Diagnosis not present

## 2018-07-30 ENCOUNTER — Other Ambulatory Visit: Payer: Self-pay | Admitting: Cardiovascular Disease

## 2018-08-02 ENCOUNTER — Ambulatory Visit (INDEPENDENT_AMBULATORY_CARE_PROVIDER_SITE_OTHER): Payer: PRIVATE HEALTH INSURANCE | Admitting: Psychology

## 2018-08-02 DIAGNOSIS — F4323 Adjustment disorder with mixed anxiety and depressed mood: Secondary | ICD-10-CM | POA: Diagnosis not present

## 2018-08-16 ENCOUNTER — Ambulatory Visit (INDEPENDENT_AMBULATORY_CARE_PROVIDER_SITE_OTHER): Payer: Medicare Other | Admitting: Psychology

## 2018-08-16 DIAGNOSIS — F4323 Adjustment disorder with mixed anxiety and depressed mood: Secondary | ICD-10-CM | POA: Diagnosis not present

## 2018-08-28 ENCOUNTER — Telehealth: Payer: Self-pay | Admitting: Cardiovascular Disease

## 2018-08-28 ENCOUNTER — Other Ambulatory Visit: Payer: Self-pay

## 2018-08-28 MED ORDER — AMIODARONE HCL 200 MG PO TABS: 200.0000 mg | ORAL_TABLET | Freq: Every day | ORAL | 1 refills | Status: DC

## 2018-08-28 NOTE — Telephone Encounter (Signed)
°*  STAT* If patient is at the pharmacy, call can be transferred to refill team.   1. Which medications need to be refilled? (please list name of each medication and dose if known)  Amiodarone (PACERONE) 200 MG - 1 tablet daily  2. Which pharmacy/location (including street and city if local pharmacy) is [facility] sent to[facility] [address]  3. Do they need a 30 day or 90 day supply? 90 day

## 2018-08-30 ENCOUNTER — Ambulatory Visit (INDEPENDENT_AMBULATORY_CARE_PROVIDER_SITE_OTHER): Payer: Medicare Other | Admitting: Psychology

## 2018-08-30 DIAGNOSIS — F4323 Adjustment disorder with mixed anxiety and depressed mood: Secondary | ICD-10-CM | POA: Diagnosis not present

## 2018-09-10 ENCOUNTER — Other Ambulatory Visit: Payer: Self-pay | Admitting: Family Medicine

## 2018-09-10 ENCOUNTER — Other Ambulatory Visit: Payer: Self-pay

## 2018-09-10 ENCOUNTER — Other Ambulatory Visit: Payer: Self-pay | Admitting: Cardiovascular Disease

## 2018-09-10 DIAGNOSIS — Z7901 Long term (current) use of anticoagulants: Secondary | ICD-10-CM

## 2018-09-10 NOTE — Telephone Encounter (Signed)
Please review for refill. Thanks!  

## 2018-09-13 ENCOUNTER — Ambulatory Visit (INDEPENDENT_AMBULATORY_CARE_PROVIDER_SITE_OTHER): Payer: Medicare Other | Admitting: Psychology

## 2018-09-13 DIAGNOSIS — F4323 Adjustment disorder with mixed anxiety and depressed mood: Secondary | ICD-10-CM

## 2018-09-16 ENCOUNTER — Other Ambulatory Visit: Payer: Self-pay | Admitting: Family Medicine

## 2018-09-17 NOTE — Telephone Encounter (Signed)
Labs 11/19 cpx 11/22 Pt aware 

## 2018-09-17 NOTE — Telephone Encounter (Signed)
Please schedule CPX

## 2018-09-17 NOTE — Telephone Encounter (Signed)
Please call patient and schedule appointment. Once appointment is scheduled send back for refill.

## 2018-09-17 NOTE — Telephone Encounter (Signed)
See appointments scheduled

## 2018-09-17 NOTE — Telephone Encounter (Signed)
cpx or med refill appointment? °

## 2018-09-27 ENCOUNTER — Ambulatory Visit (INDEPENDENT_AMBULATORY_CARE_PROVIDER_SITE_OTHER): Payer: Medicare Other | Admitting: Psychology

## 2018-09-27 DIAGNOSIS — F4323 Adjustment disorder with mixed anxiety and depressed mood: Secondary | ICD-10-CM

## 2018-10-11 ENCOUNTER — Ambulatory Visit (INDEPENDENT_AMBULATORY_CARE_PROVIDER_SITE_OTHER): Payer: Medicare Other | Admitting: Psychology

## 2018-10-11 DIAGNOSIS — F4323 Adjustment disorder with mixed anxiety and depressed mood: Secondary | ICD-10-CM

## 2018-10-23 ENCOUNTER — Other Ambulatory Visit: Payer: PRIVATE HEALTH INSURANCE

## 2018-10-23 ENCOUNTER — Other Ambulatory Visit: Payer: Self-pay | Admitting: Family Medicine

## 2018-10-23 DIAGNOSIS — R972 Elevated prostate specific antigen [PSA]: Secondary | ICD-10-CM

## 2018-10-23 DIAGNOSIS — E119 Type 2 diabetes mellitus without complications: Secondary | ICD-10-CM

## 2018-10-23 DIAGNOSIS — E782 Mixed hyperlipidemia: Secondary | ICD-10-CM

## 2018-10-23 DIAGNOSIS — I480 Paroxysmal atrial fibrillation: Secondary | ICD-10-CM

## 2018-10-24 ENCOUNTER — Other Ambulatory Visit: Payer: Self-pay | Admitting: Cardiovascular Disease

## 2018-10-25 ENCOUNTER — Ambulatory Visit (INDEPENDENT_AMBULATORY_CARE_PROVIDER_SITE_OTHER): Payer: PRIVATE HEALTH INSURANCE | Admitting: Psychology

## 2018-10-25 DIAGNOSIS — F4323 Adjustment disorder with mixed anxiety and depressed mood: Secondary | ICD-10-CM

## 2018-10-26 ENCOUNTER — Encounter: Payer: Self-pay | Admitting: Family Medicine

## 2018-10-26 ENCOUNTER — Encounter: Payer: PRIVATE HEALTH INSURANCE | Admitting: Family Medicine

## 2018-10-26 DIAGNOSIS — Z0289 Encounter for other administrative examinations: Secondary | ICD-10-CM

## 2018-10-26 NOTE — Progress Notes (Deleted)
There were no vitals taken for this visit.   CC: CPE Subjective:    Patient ID: Jonathon Middleton, male    DOB: 05/14/1951, 67 y.o.   MRN: [id number]  HPI: Jonathon Middleton is a 67 y.o. male presenting on 10/26/2018 for No chief complaint on file.   Preventative: Colonoscopy - 2008 ([place]) WNL, rpt 10 yrs.  Prostate cancer screening - desires to continue screening regularly. Flu not done Pneumovax 10/2013 Tdap - 04/2014 zostavax 10/2013 Seat belt use discussed Sunscreen use discussed, no changing moles on skin.  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 Interim medical history since our last visit reviewed. Allergies and medications reviewed and updated. Outpatient Medications Prior to Visit  Medication Sig Dispense Refill  . amiodarone (PACERONE) 200 MG tablet Take 1 tablet (200 mg total) by mouth daily. 180 tablet 1  . atorvastatin (LIPITOR) 40 MG tablet TAKE 1 TABLET AT BEDTIME 90 tablet 0  . Cholecalciferol (VITAMIN D) 2000 units CAPS Take 2,000 Units by mouth 2 (two) times daily.     . Coenzyme Q10 (CO Q-10) 100 MG CAPS Take 100 mg by mouth daily with breakfast.     . diltiazem (CARDIZEM CD) 120 MG 24 hr capsule TAKE 1 CAPSULE (120 MG TOTAL) DAILY BY MOUTH. 90 capsule 1  . ELIQUIS 5 MG TABS tablet TAKE 1 TABLET TWICE A DAY 180 tablet 0  . furosemide (LASIX) 20 MG tablet Take 1 tablet (20 mg) by mouth once daily as needed for swelling 30 tablet 1  . furosemide (LASIX) 20 MG tablet TAKE 1 TABLET BY MOUTH EVERY DAY AS NEEDED 90 tablet 3  . glucose blood (ONE TOUCH ULTRA TEST) test strip Use to check sugar once daily and as needed. Dx: E11.9 100 each 3  . ibuprofen  (ADVIL,MOTRIN) 200 MG tablet Take 400 mg by mouth every 8 (eight) hours as needed (for pain.).    Marland Kitchen. Lancets (ONETOUCH ULTRASOFT) lancets USE AS INSTRUCTED 100 each 0  . losartan (COZAAR) 50 MG tablet Take 1 tablet (50 mg total) by mouth daily. 90 tablet 3  . Melatonin 10 MG TABS Take 10 mg by mouth at bedtime.     . metoprolol succinate (TOPROL-XL) 50 MG 24 hr tablet Take 1 tablet (50 mg total) by mouth daily. 90 tablet 3  . Multiple Vitamin (MULTIVITAMIN WITH MINERALS) TABS tablet Take 1 tablet by mouth daily with breakfast.    . omega-3 fish oil (MAXEPA) 1000 MG CAPS capsule Take 1 capsule by mouth 2 (two) times daily.     . sertraline (ZOLOFT) 100 MG tablet Take 1 tablet (100 mg total) by mouth daily. 90 tablet 0   No facility-administered medications prior to visit.      Per HPI unless specifically indicated in ROS section below Review of Systems     Objective:    There were no vitals taken for this visit.  Wt Readings from Last 3 Encounters:  04/04/18 224 lb (101.6 kg)  09/29/17 216 lb 8 oz (98.2 kg)  09/21/17 209 lb (94.8 kg)    Physical Exam  Constitutional: He is oriented to person, place, and time. He appears well-developed and well-nourished. No distress.  HENT:  Head: Normocephalic and  atraumatic.  Right Ear: Hearing, tympanic membrane, external ear and ear canal normal.  Left Ear: Hearing, tympanic membrane, external ear and ear canal normal.  Nose: Nose normal.  Mouth/Throat: Uvula is midline, oropharynx is clear and moist and mucous membranes are normal. No oropharyngeal exudate, posterior oropharyngeal edema or posterior oropharyngeal erythema.  Eyes: Pupils are equal, round, and reactive to light. Conjunctivae and EOM are normal. No scleral icterus.  Neck: Normal range of motion. Neck supple.  Cardiovascular: Normal rate, regular rhythm, normal heart sounds and intact distal pulses.  No murmur heard. Pulses:      Radial pulses are 2+ on the right side, and 2+ on  the left side.  Pulmonary/Chest: Effort normal and breath sounds normal. No respiratory distress. He has no wheezes. He has no rales.  Abdominal: Soft. Bowel sounds are normal. He exhibits no distension and no mass. There is no tenderness. There is no rebound and no guarding.  Musculoskeletal: Normal range of motion. He exhibits no edema.  Lymphadenopathy:    He has no cervical adenopathy.  Neurological: He is alert and oriented to person, place, and time.  CN grossly intact, station and gait intact  Skin: Skin is warm and dry. No rash noted.  Psychiatric: He has a normal mood and affect. His behavior is normal. Judgment and thought content normal.  Nursing note and vitals reviewed.  Results for orders placed or performed in visit on 09/29/17  Microalbumin / creatinine urine ratio  Result Value Ref Range   Microalb, Ur 0.9 0.0 - 1.9 mg/dL   Creatinine,U 644.0 mg/dL   Microalb Creat Ratio 0.5 0.0 - 30.0 mg/g      Assessment & Plan:   Problem List Items Addressed This Visit    None       No orders of the defined types were placed in this encounter.  No orders of the defined types were placed in this encounter.   Follow up plan: No follow-ups on file.  Eustaquio Boyden, MD

## 2018-10-27 ENCOUNTER — Telehealth: Payer: Self-pay | Admitting: Family Medicine

## 2018-10-27 NOTE — Telephone Encounter (Signed)
Pt missed appt on Friday. plz call and offer to reschedule.  Last seen 09/2017

## 2018-10-31 NOTE — Telephone Encounter (Signed)
Left message asking pt to call office  °

## 2018-11-07 NOTE — Telephone Encounter (Signed)
Left message asking pt to call office  °

## 2018-11-08 ENCOUNTER — Ambulatory Visit (INDEPENDENT_AMBULATORY_CARE_PROVIDER_SITE_OTHER): Payer: PRIVATE HEALTH INSURANCE | Admitting: Psychology

## 2018-11-08 DIAGNOSIS — F4323 Adjustment disorder with mixed anxiety and depressed mood: Secondary | ICD-10-CM | POA: Diagnosis not present

## 2018-11-13 ENCOUNTER — Encounter: Payer: Self-pay | Admitting: Family Medicine

## 2018-11-13 NOTE — Telephone Encounter (Signed)
Thank you :)

## 2018-11-13 NOTE — Telephone Encounter (Signed)
Left message asking pt to call office Mailed letter 

## 2018-11-22 ENCOUNTER — Ambulatory Visit (INDEPENDENT_AMBULATORY_CARE_PROVIDER_SITE_OTHER): Payer: PRIVATE HEALTH INSURANCE | Admitting: Psychology

## 2018-11-22 DIAGNOSIS — F4323 Adjustment disorder with mixed anxiety and depressed mood: Secondary | ICD-10-CM | POA: Diagnosis not present

## 2018-12-06 ENCOUNTER — Ambulatory Visit (INDEPENDENT_AMBULATORY_CARE_PROVIDER_SITE_OTHER): Payer: PRIVATE HEALTH INSURANCE | Admitting: Psychology

## 2018-12-06 DIAGNOSIS — F4323 Adjustment disorder with mixed anxiety and depressed mood: Secondary | ICD-10-CM

## 2018-12-16 ENCOUNTER — Telehealth: Payer: Self-pay | Admitting: Family Medicine

## 2018-12-17 NOTE — Telephone Encounter (Signed)
E-scribed refill. Please schedule physical.

## 2018-12-18 NOTE — Telephone Encounter (Signed)
Left message asking pt to call office  °

## 2018-12-20 ENCOUNTER — Ambulatory Visit (INDEPENDENT_AMBULATORY_CARE_PROVIDER_SITE_OTHER): Payer: PRIVATE HEALTH INSURANCE | Admitting: Psychology

## 2018-12-20 DIAGNOSIS — F4323 Adjustment disorder with mixed anxiety and depressed mood: Secondary | ICD-10-CM

## 2018-12-25 NOTE — Telephone Encounter (Signed)
Left message asking pt to call office  °

## 2018-12-28 IMAGING — RF DG HUMERUS 2V *R*
1 series · 2 of 2 positions shown · non-contrast
Comparison: Right humerus films of 08/25/2017

CLINICAL DATA: ORIF of right humeral fracture

EXAM:
RIGHT HUMERUS - 2+ VIEW

[Series 1: run · 2 of 2 slices shown]
[im 1/2]
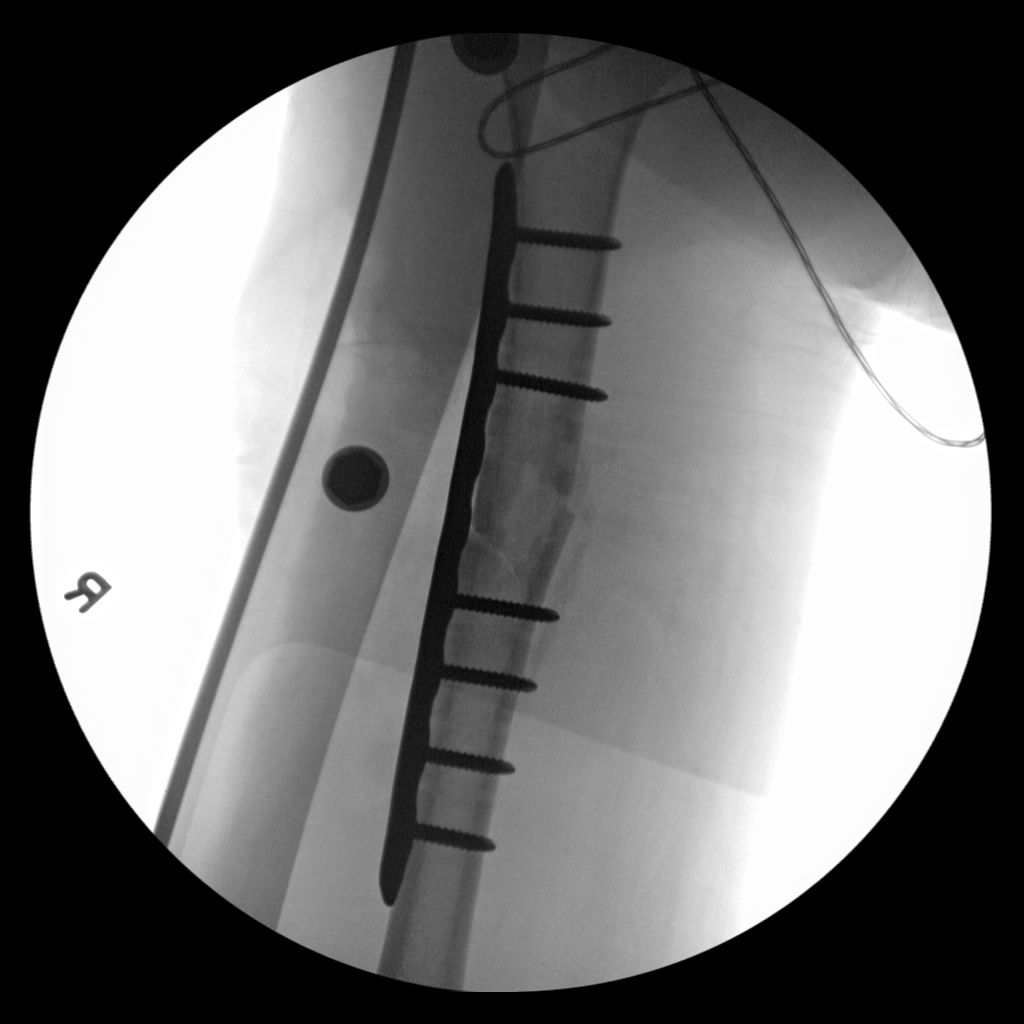
[im 2/2]
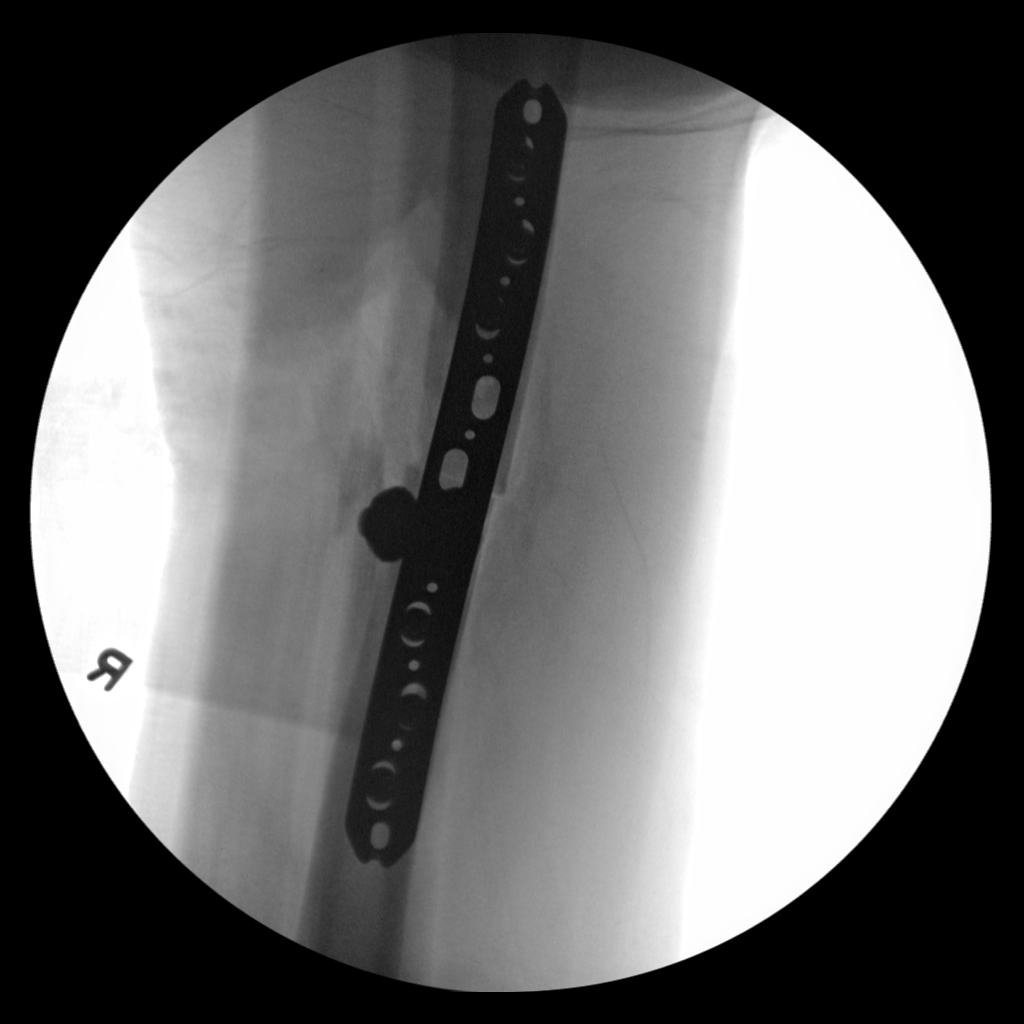

[2 of 2 positions shown; findings below may reference images not displayed]

FINDINGS: There is now new plate and screw fixation of the mid proximal right
humeral fracture in good position alignment on the 2 C-arm spot film
images obtained. No complicating features are evident.
IMPRESSION: Plate and screw fixation of mid proximal right humeral fracture.

## 2019-01-02 ENCOUNTER — Other Ambulatory Visit: Payer: Self-pay | Admitting: Cardiovascular Disease

## 2019-01-02 ENCOUNTER — Other Ambulatory Visit: Payer: Self-pay | Admitting: Family Medicine

## 2019-01-02 NOTE — Telephone Encounter (Signed)
Please review for refills.

## 2019-01-03 ENCOUNTER — Ambulatory Visit: Payer: Medicare Other | Admitting: Psychology

## 2019-01-10 ENCOUNTER — Encounter: Payer: Self-pay | Admitting: Family Medicine

## 2019-01-10 NOTE — Telephone Encounter (Signed)
Noted  

## 2019-01-10 NOTE — Telephone Encounter (Signed)
Mailed letter °

## 2019-01-17 ENCOUNTER — Ambulatory Visit (INDEPENDENT_AMBULATORY_CARE_PROVIDER_SITE_OTHER): Payer: PRIVATE HEALTH INSURANCE | Admitting: Psychology

## 2019-01-17 DIAGNOSIS — F4323 Adjustment disorder with mixed anxiety and depressed mood: Secondary | ICD-10-CM

## 2019-01-28 ENCOUNTER — Other Ambulatory Visit: Payer: Self-pay | Admitting: Family Medicine

## 2019-01-31 ENCOUNTER — Ambulatory Visit (INDEPENDENT_AMBULATORY_CARE_PROVIDER_SITE_OTHER): Payer: PRIVATE HEALTH INSURANCE | Admitting: Psychology

## 2019-01-31 DIAGNOSIS — F4323 Adjustment disorder with mixed anxiety and depressed mood: Secondary | ICD-10-CM

## 2019-02-14 ENCOUNTER — Ambulatory Visit (INDEPENDENT_AMBULATORY_CARE_PROVIDER_SITE_OTHER): Payer: PRIVATE HEALTH INSURANCE | Admitting: Psychology

## 2019-02-14 ENCOUNTER — Other Ambulatory Visit: Payer: Self-pay

## 2019-02-14 DIAGNOSIS — F4323 Adjustment disorder with mixed anxiety and depressed mood: Secondary | ICD-10-CM

## 2019-02-28 ENCOUNTER — Ambulatory Visit (INDEPENDENT_AMBULATORY_CARE_PROVIDER_SITE_OTHER): Payer: PRIVATE HEALTH INSURANCE | Admitting: Psychology

## 2019-02-28 DIAGNOSIS — F4323 Adjustment disorder with mixed anxiety and depressed mood: Secondary | ICD-10-CM | POA: Diagnosis not present

## 2019-03-14 ENCOUNTER — Ambulatory Visit (INDEPENDENT_AMBULATORY_CARE_PROVIDER_SITE_OTHER): Payer: PRIVATE HEALTH INSURANCE | Admitting: Psychology

## 2019-03-14 DIAGNOSIS — F4323 Adjustment disorder with mixed anxiety and depressed mood: Secondary | ICD-10-CM

## 2019-03-17 ENCOUNTER — Other Ambulatory Visit: Payer: Self-pay | Admitting: Family Medicine

## 2019-03-28 ENCOUNTER — Ambulatory Visit (INDEPENDENT_AMBULATORY_CARE_PROVIDER_SITE_OTHER): Payer: PRIVATE HEALTH INSURANCE | Admitting: Psychology

## 2019-03-28 DIAGNOSIS — F4323 Adjustment disorder with mixed anxiety and depressed mood: Secondary | ICD-10-CM

## 2019-04-11 ENCOUNTER — Ambulatory Visit (INDEPENDENT_AMBULATORY_CARE_PROVIDER_SITE_OTHER): Payer: PRIVATE HEALTH INSURANCE | Admitting: Psychology

## 2019-04-11 DIAGNOSIS — F4323 Adjustment disorder with mixed anxiety and depressed mood: Secondary | ICD-10-CM | POA: Diagnosis not present

## 2019-04-15 ENCOUNTER — Other Ambulatory Visit: Payer: Self-pay | Admitting: Family Medicine

## 2019-04-16 NOTE — Telephone Encounter (Signed)
Electronic refill request Sertraline Last office visit 09/29/17 Last refill 12/17/18 Letter mailed to patient to call the office and schedule appointment No upcoming appointment scheduled

## 2019-04-18 NOTE — Telephone Encounter (Signed)
plz call to try and schedule appt.

## 2019-04-19 ENCOUNTER — Encounter: Payer: Self-pay | Admitting: Family Medicine

## 2019-04-19 ENCOUNTER — Ambulatory Visit (INDEPENDENT_AMBULATORY_CARE_PROVIDER_SITE_OTHER): Payer: PRIVATE HEALTH INSURANCE | Admitting: Family Medicine

## 2019-04-19 VITALS — BP 165/102 | HR 85 | Ht 70.0 in | Wt 221.5 lb

## 2019-04-19 DIAGNOSIS — I10 Essential (primary) hypertension: Secondary | ICD-10-CM

## 2019-04-19 DIAGNOSIS — F331 Major depressive disorder, recurrent, moderate: Secondary | ICD-10-CM

## 2019-04-19 DIAGNOSIS — I480 Paroxysmal atrial fibrillation: Secondary | ICD-10-CM | POA: Diagnosis not present

## 2019-04-19 DIAGNOSIS — Z1211 Encounter for screening for malignant neoplasm of colon: Secondary | ICD-10-CM | POA: Diagnosis not present

## 2019-04-19 DIAGNOSIS — E782 Mixed hyperlipidemia: Secondary | ICD-10-CM

## 2019-04-19 DIAGNOSIS — E119 Type 2 diabetes mellitus without complications: Secondary | ICD-10-CM

## 2019-04-19 MED ORDER — METOPROLOL SUCCINATE ER 50 MG PO TB24: 50.0000 mg | ORAL_TABLET | Freq: Every day | ORAL | 1 refills | Status: DC

## 2019-04-19 MED ORDER — AMIODARONE HCL 200 MG PO TABS: 200.0000 mg | ORAL_TABLET | Freq: Every day | ORAL | 1 refills | Status: DC

## 2019-04-19 MED ORDER — APIXABAN 5 MG PO TABS: 5.0000 mg | ORAL_TABLET | Freq: Two times a day (BID) | ORAL | 1 refills | Status: DC

## 2019-04-19 MED ORDER — SERTRALINE HCL 100 MG PO TABS: 100.0000 mg | ORAL_TABLET | Freq: Every day | ORAL | 1 refills | Status: DC

## 2019-04-19 MED ORDER — LOSARTAN POTASSIUM 100 MG PO TABS: 50.0000 mg | ORAL_TABLET | Freq: Every day | ORAL | 1 refills | Status: DC

## 2019-04-19 MED ORDER — DILTIAZEM HCL ER COATED BEADS 120 MG PO CP24: 120.0000 mg | ORAL_CAPSULE | Freq: Every day | ORAL | 1 refills | Status: DC

## 2019-04-19 MED ORDER — ATORVASTATIN CALCIUM 40 MG PO TABS: 40.0000 mg | ORAL_TABLET | Freq: Every day | ORAL | 1 refills | Status: DC

## 2019-04-19 MED ORDER — FUROSEMIDE 20 MG PO TABS: 20.0000 mg | ORAL_TABLET | Freq: Every day | ORAL | 3 refills | Status: DC | PRN

## 2019-04-19 NOTE — Assessment & Plan Note (Signed)
Refilled eliquis and amiodarone and metoprolol - did recommend labwork -he will come for curbside labs on Monday. Overdue for cards f/u.

## 2019-04-19 NOTE — Assessment & Plan Note (Signed)
Update A1c. Was doing well off medications with diet controlled diabetes.

## 2019-04-19 NOTE — Assessment & Plan Note (Signed)
Chronic, deteriorated off meds for a while. Refilled, encouraged compliance, check labs when he comes on Monday.

## 2019-04-19 NOTE — Addendum Note (Signed)
Addended by: Eustaquio Boyden on: 04/19/2019 12:19 PM   Modules accepted: Level of Service

## 2019-04-19 NOTE — Addendum Note (Signed)
Addended by: Eustaquio Boyden on: 04/19/2019 12:16 PM   Modules accepted: Orders

## 2019-04-19 NOTE — Assessment & Plan Note (Addendum)
Off statin for 1+ month. Will refill. Will check FLP.  The ASCVD Risk score Denman George DC Jr., et al., 2013) failed to calculate for the following reasons:   The valid total cholesterol range is 130 to 320 mg/dL

## 2019-04-19 NOTE — Telephone Encounter (Signed)
Pt is scheduled for today 04/19/19 @ 11:30am

## 2019-04-19 NOTE — Progress Notes (Addendum)
Virtual visit completed through Doxy.Me. Due to national recommendations of social distancing due to COVID 19, a virtual visit is felt to be most appropriate for this patient at this time.   Patient location: home Provider location: [place] [facility] If any vitals were documented, they were collected by patient at home unless specified below.    BP (!) 165/102 (BP Location: Right Wrist)   Pulse 85   Ht 5\' 10"  (1.778 m)   Wt 221 lb 8 oz (100.5 kg)   BMI 31.78 kg/m    CC: med refill visit Subjective:    Patient ID: Jonathon Middleton, male    DOB: [birth year], 68 y.o.   MRN: [id number]  HPI: Jonathon Gublerevin Bard is a 68 y.o. male presenting on 04/19/2019 for Follow-up (Been off of meds for awhile due to loss of job/insurance. )   Last seen here 09/2017.  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 Currently has no insurance. Ran out of most meds. Off BP meds for over a month. Needs meds refilled. Continues exercising regularly. Not currently taking amiodarone.   DM - overdue for follow up with labs.  Afib - previously followed by cardiology but had not seen recently, was on amiodarone and eliquis and metoprolol, not currently. Requests refills.  Depression - was on sertraline 100mg  but not recently. Continues seeing Merchandiser, retailTerri Bauert.     Relevant past medical, surgical, family and social history reviewed and updated as indicated. Interim medical history since our last visit reviewed. Allergies and medications reviewed and updated. Outpatient Medications Prior to Visit  Medication Sig Dispense Refill  . Cholecalciferol (VITAMIN D) 2000 units CAPS Take 2,000 Units by mouth 2 (two) times daily.     . Coenzyme Q10 (CO Q-10) 100 MG CAPS Take 100 mg by mouth daily with breakfast.     . glucose blood (ONE TOUCH ULTRA TEST) test strip Use to check sugar once daily and as needed. Dx: E11.9 100 each 3  . Lancets (ONETOUCH ULTRASOFT) lancets USE AS INSTRUCTED 100 each 0  . Melatonin 10 MG TABS Take 10 mg by  mouth at bedtime.     . Multiple Vitamin (MULTIVITAMIN WITH MINERALS) TABS tablet Take 1 tablet by mouth daily with breakfast.    . omega-3 fish oil (MAXEPA) 1000 MG CAPS capsule Take 1 capsule by mouth 2 (two) times daily.     Marland Kitchen. amiodarone (PACERONE) 200 MG tablet Take 1 tablet (200 mg total) by mouth daily. 180 tablet 1  . atorvastatin (LIPITOR) 40 MG tablet TAKE 1 TABLET AT BEDTIME 90 tablet 0  . diltiazem (CARDIZEM CD) 120 MG 24 hr capsule TAKE 1 CAPSULE (120 MG TOTAL) DAILY BY MOUTH. 90 capsule 1  . ELIQUIS 5 MG TABS tablet TAKE 1 TABLET TWICE A DAY  (NEEDS LABS TO ASSESS      KIDNEY FUNCTION PRIOR TO   FURTHER REFILLS) 180 tablet 0  . furosemide (LASIX) 20 MG tablet TAKE 1 TABLET BY MOUTH EVERY DAY AS NEEDED 90 tablet 3  . ibuprofen (ADVIL,MOTRIN) 200 MG tablet Take 400 mg by mouth every 8 (eight) hours as needed (for pain.).    Marland Kitchen. losartan (COZAAR) 50 MG tablet Take 1 tablet (50 mg total) by mouth daily. 90 tablet 3  . metoprolol succinate (TOPROL-XL) 50 MG 24 hr tablet Take 1 tablet (50 mg total) by mouth daily. 90 tablet 3  . sertraline (ZOLOFT) 100 MG tablet TAKE 1 TABLET BY MOUTH EVERY DAY 30 tablet 1  . furosemide (LASIX) 20  MG tablet Take 1 tablet (20 mg) by mouth once daily as needed for swelling 30 tablet 1   No facility-administered medications prior to visit.      Per HPI unless specifically indicated in ROS section below Review of Systems Objective:    BP (!) 165/102 (BP Location: Right Wrist)   Pulse 85   Ht 5\' 10"  (1.778 m)   Wt 221 lb 8 oz (100.5 kg)   BMI 31.78 kg/m   Wt Readings from Last 3 Encounters:  04/19/19 221 lb 8 oz (100.5 kg)  04/04/18 224 lb (101.6 kg)  09/29/17 216 lb 8 oz (98.2 kg)     Physical exam: Gen: alert, NAD, not ill appearing Pulm: speaks in complete sentences without increased work of breathing Psych: normal mood, normal thought content      Results for orders placed or performed in visit on 09/29/17  Microalbumin / creatinine urine  ratio  Result Value Ref Range   Microalb, Ur 0.9 0.0 - 1.9 mg/dL   Creatinine,U 122.4 mg/dL   Microalb Creat Ratio 0.5 0.0 - 30.0 mg/g   Lab Results  Component Value Date   PSA 1.40 11/07/2016   PSA 4.13 (H) 06/01/2016   PSA 1.88 04/20/2015   Lab Results  Component Value Date   HGBA1C 6.1 (H) 08/25/2017    Assessment & Plan:   Problem List Items Addressed This Visit    Paroxysmal atrial fibrillation (HCC) - Primary    Refilled eliquis and amiodarone and metoprolol - did recommend labwork -he will come for curbside labs on Monday. Overdue for cards f/u.      Relevant Medications   amiodarone (PACERONE) 200 MG tablet   atorvastatin (LIPITOR) 40 MG tablet   diltiazem (CARDIZEM CD) 120 MG 24 hr capsule   apixaban (ELIQUIS) 5 MG TABS tablet   furosemide (LASIX) 20 MG tablet   losartan (COZAAR) 100 MG tablet   metoprolol succinate (TOPROL-XL) 50 MG 24 hr tablet   Other Relevant Orders   TSH   MDD (major depressive disorder), recurrent episode (HCC)    Chronic, stable period seeing Terri Bauert. Appreciate psychology care of patient. Now off sertraline, declines refill.       Relevant Medications   sertraline (ZOLOFT) 100 MG tablet   Hyperlipidemia    Off statin for 1+ month. Will refill. Will check FLP.  The ASCVD Risk score Denman George DC Jr., et al., 2013) failed to calculate for the following reasons:   The valid total cholesterol range is 130 to 320 mg/dL       Relevant Medications   amiodarone (PACERONE) 200 MG tablet   atorvastatin (LIPITOR) 40 MG tablet   diltiazem (CARDIZEM CD) 120 MG 24 hr capsule   apixaban (ELIQUIS) 5 MG TABS tablet   furosemide (LASIX) 20 MG tablet   losartan (COZAAR) 100 MG tablet   metoprolol succinate (TOPROL-XL) 50 MG 24 hr tablet   Other Relevant Orders   Lipid panel   Essential hypertension    Chronic, deteriorated off meds for a while. Refilled, encouraged compliance, check labs when he comes on Monday.       Relevant Medications    amiodarone (PACERONE) 200 MG tablet   atorvastatin (LIPITOR) 40 MG tablet   diltiazem (CARDIZEM CD) 120 MG 24 hr capsule   apixaban (ELIQUIS) 5 MG TABS tablet   furosemide (LASIX) 20 MG tablet   losartan (COZAAR) 100 MG tablet   metoprolol succinate (TOPROL-XL) 50 MG 24 hr tablet   Other Relevant  Orders   Basic metabolic panel   Diabetes mellitus type 2, controlled, without complications (HCC)    Update A1c. Was doing well off medications with diet controlled diabetes.       Relevant Medications   atorvastatin (LIPITOR) 40 MG tablet   losartan (COZAAR) 100 MG tablet   Other Relevant Orders   Hemoglobin A1c   Basic metabolic panel    Other Visit Diagnoses    Special screening for malignant neoplasms, colon       Relevant Orders   Fecal occult blood, imunochemical       Meds ordered this encounter  Medications  . amiodarone (PACERONE) 200 MG tablet    Sig: Take 1 tablet (200 mg total) by mouth daily.    Dispense:  90 tablet    Refill:  1  . atorvastatin (LIPITOR) 40 MG tablet    Sig: Take 1 tablet (40 mg total) by mouth at bedtime.    Dispense:  90 tablet    Refill:  1  . diltiazem (CARDIZEM CD) 120 MG 24 hr capsule    Sig: Take 1 capsule (120 mg total) by mouth daily.    Dispense:  90 capsule    Refill:  1  . apixaban (ELIQUIS) 5 MG TABS tablet    Sig: Take 1 tablet (5 mg total) by mouth 2 (two) times daily.    Dispense:  180 tablet    Refill:  1  . furosemide (LASIX) 20 MG tablet    Sig: Take 1 tablet (20 mg total) by mouth daily as needed.    Dispense:  90 tablet    Refill:  3  . losartan (COZAAR) 100 MG tablet    Sig: Take 0.5 tablets (50 mg total) by mouth daily.    Dispense:  45 tablet    Refill:  1  . metoprolol succinate (TOPROL-XL) 50 MG 24 hr tablet    Sig: Take 1 tablet (50 mg total) by mouth daily.    Dispense:  90 tablet    Refill:  1  . sertraline (ZOLOFT) 100 MG tablet    Sig: Take 1 tablet (100 mg total) by mouth daily.    Dispense:  90 tablet     Refill:  1   Orders Placed This Encounter  Procedures  . Fecal occult blood, imunochemical    Standing Status:   Future    Standing Expiration Date:   04/18/2020  . Lipid panel    Standing Status:   Future    Standing Expiration Date:   04/18/2020  . Hemoglobin A1c    Standing Status:   Future    Standing Expiration Date:   04/18/2020  . TSH    Standing Status:   Future    Standing Expiration Date:   04/18/2020  . Basic metabolic panel    Standing Status:   Future    Standing Expiration Date:   04/18/2020    Follow up plan: Return in about 3 months (around 07/20/2019) for follow up visit.  Eustaquio Boyden, MD

## 2019-04-19 NOTE — Assessment & Plan Note (Signed)
Chronic, stable period seeing Terri Bauert. Appreciate psychology care of patient. Now off sertraline, declines refill.

## 2019-04-22 ENCOUNTER — Other Ambulatory Visit (INDEPENDENT_AMBULATORY_CARE_PROVIDER_SITE_OTHER): Payer: PRIVATE HEALTH INSURANCE

## 2019-04-22 DIAGNOSIS — E782 Mixed hyperlipidemia: Secondary | ICD-10-CM

## 2019-04-22 DIAGNOSIS — R972 Elevated prostate specific antigen [PSA]: Secondary | ICD-10-CM | POA: Diagnosis not present

## 2019-04-22 DIAGNOSIS — E119 Type 2 diabetes mellitus without complications: Secondary | ICD-10-CM | POA: Diagnosis not present

## 2019-04-22 DIAGNOSIS — I480 Paroxysmal atrial fibrillation: Secondary | ICD-10-CM | POA: Diagnosis not present

## 2019-04-22 LAB — CBC WITH DIFFERENTIAL/PLATELET
Basophils Absolute: 0 10*3/uL (ref 0.0–0.1)
Basophils Relative: 0.6 % (ref 0.0–3.0)
Eosinophils Absolute: 0.2 10*3/uL (ref 0.0–0.7)
Eosinophils Relative: 3.4 % (ref 0.0–5.0)
HCT: 43.3 % (ref 39.0–52.0)
Hemoglobin: 14.8 g/dL (ref 13.0–17.0)
Lymphocytes Relative: 31.2 % (ref 12.0–46.0)
Lymphs Abs: 1.5 10*3/uL (ref 0.7–4.0)
MCHC: 34.2 g/dL (ref 30.0–36.0)
MCV: 89.3 fl (ref 78.0–100.0)
Monocytes Absolute: 0.6 10*3/uL (ref 0.1–1.0)
Monocytes Relative: 12 % (ref 3.0–12.0)
Neutro Abs: 2.6 10*3/uL (ref 1.4–7.7)
Neutrophils Relative %: 52.8 % (ref 43.0–77.0)
Platelets: 215 10*3/uL (ref 150.0–400.0)
RBC: 4.85 Mil/uL (ref 4.22–5.81)
RDW: 14.5 % (ref 11.5–15.5)
WBC: 4.9 10*3/uL (ref 4.0–10.5)

## 2019-04-22 LAB — LIPID PANEL
Cholesterol: 277 mg/dL — ABNORMAL HIGH (ref 0–200)
HDL: 35.2 mg/dL — ABNORMAL LOW (ref >39.00)
NonHDL: 241.89
Total CHOL/HDL Ratio: 8
Triglycerides: 262 mg/dL — ABNORMAL HIGH (ref 0.0–149.0)
VLDL: 52.4 mg/dL — ABNORMAL HIGH (ref 0.0–40.0)

## 2019-04-22 LAB — COMPREHENSIVE METABOLIC PANEL
ALT: 22 U/L (ref 0–53)
AST: 22 U/L (ref 0–37)
Albumin: 4.1 g/dL (ref 3.5–5.2)
Alkaline Phosphatase: 104 U/L (ref 39–117)
BUN: 19 mg/dL (ref 6–23)
CO2: 30 mEq/L (ref 19–32)
Calcium: 8.8 mg/dL (ref 8.4–10.5)
Chloride: 104 mEq/L (ref 96–112)
Creatinine, Ser: 1.25 mg/dL (ref 0.40–1.50)
GFR: 57.69 mL/min — ABNORMAL LOW (ref >60.00)
Glucose, Bld: 110 mg/dL — ABNORMAL HIGH (ref 70–99)
Potassium: 4.6 mEq/L (ref 3.5–5.1)
Sodium: 142 mEq/L (ref 135–145)
Total Bilirubin: 0.7 mg/dL (ref 0.2–1.2)
Total Protein: 6.3 g/dL (ref 6.0–8.3)

## 2019-04-22 LAB — TSH: TSH: 2.32 u[IU]/mL (ref 0.35–4.50)

## 2019-04-22 LAB — LDL CHOLESTEROL, DIRECT: Direct LDL: 125 mg/dL

## 2019-04-22 LAB — HEMOGLOBIN A1C: Hgb A1c MFr Bld: 6.2 % (ref 4.6–6.5)

## 2019-04-22 LAB — PSA: PSA: 2.4 ng/mL (ref 0.10–4.00)

## 2019-04-25 ENCOUNTER — Ambulatory Visit (INDEPENDENT_AMBULATORY_CARE_PROVIDER_SITE_OTHER): Payer: PRIVATE HEALTH INSURANCE | Admitting: Psychology

## 2019-04-25 DIAGNOSIS — F4323 Adjustment disorder with mixed anxiety and depressed mood: Secondary | ICD-10-CM

## 2019-05-09 ENCOUNTER — Ambulatory Visit (INDEPENDENT_AMBULATORY_CARE_PROVIDER_SITE_OTHER): Payer: PRIVATE HEALTH INSURANCE | Admitting: Psychology

## 2019-05-09 DIAGNOSIS — F4323 Adjustment disorder with mixed anxiety and depressed mood: Secondary | ICD-10-CM | POA: Diagnosis not present

## 2019-05-15 ENCOUNTER — Telehealth: Payer: Self-pay

## 2019-05-15 NOTE — Telephone Encounter (Signed)
Virtual Visit Pre-Appointment Phone Call  "Jonathon Middleton, Jonathon Middleton am calling you today to discuss your upcoming appointment. We are currently trying to limit exposure to the virus that causes COVID-19 by seeing patients at home rather than in the office."  1. "What is the BEST phone number to call the day of the visit?" - include this in appointment notes  2. "Do you have or have access to (through a family member/friend) a smartphone with video capability that we can use for your visit?" a. If yes - list this number in appt notes as "cell" (if different from BEST phone #) and list the appointment type as a VIDEO visit in appointment notes b. If no - list the appointment type as a PHONE visit in appointment notes  3. Confirm consent - "In the setting of the current Covid19 crisis, you are scheduled for a video visit with your provider on 06/04/2019 at 8:00AM.  Just as we do with many in-office visits, in order for you to participate in this visit, we must obtain consent.  If you'd like, Jonathon Middleton can send this to your mychart (if signed up) or email for you to review.  Otherwise, Jonathon Middleton can obtain your verbal consent now.  All virtual visits are billed to your insurance company just like a normal visit would be.  By agreeing to a virtual visit, we'd like you to understand that the technology does not allow for your provider to perform an examination, and thus may limit your provider's ability to fully assess your condition. If your provider identifies any concerns that need to be evaluated in person, we will make arrangements to do so.  Finally, though the technology is pretty good, we cannot assure that it will always work on either your or our end, and in the setting of a video visit, we may have to convert it to a phone-only visit.  In either situation, we cannot ensure that we have a secure connection.  Are you willing to proceed?" STAFF: Did the patient verbally acknowledge consent to telehealth visit? Document YES/NO  here: YES  4. Advise patient to be prepared - "Two hours prior to your appointment, go ahead and check your blood pressure, pulse, oxygen saturation, and your weight (if you have the equipment to check those) and write them all down. When your visit starts, your provider will ask you for this information. If you have an Apple Watch or Kardia device, please plan to have heart rate information ready on the day of your appointment. Please have a pen and paper handy nearby the day of the visit as well."  5. Give patient instructions for MyChart download to smartphone OR Doximity/[email or web address] as below if video visit (depending on what platform provider is using)  6. Inform patient they will receive a phone call 15 minutes prior to their appointment time (may be from unknown caller ID) so they should be prepared to answer    TELEPHONE CALL NOTE  Jonathon Middleton has been deemed a candidate for a follow-up tele-health visit to limit community exposure during the Covid-19 pandemic. Jonathon Middleton spoke with the patient via phone to ensure availability of phone/video source, confirm preferred email & phone number, and discuss instructions and expectations.  Jonathon Middleton reminded Jonathon Middleton to be prepared with any vital sign and/or heart rhythm information that could potentially be obtained via home monitoring, at the time of his visit. Jonathon Middleton reminded Jonathon Middleton to expect a phone call prior to his visit.  Jonathon Middleton  Jonathon Middleton 05/15/2019 2:49 PM   INSTRUCTIONS FOR DOWNLOADING THE MYCHART APP TO SMARTPHONE  - The patient must first make sure to have activated MyChart and know their login information - If Apple, go to [employer] Store and type in MyChart in the search bar and download the app. If Android, ask patient to go to [employer] Play Store and type in Front RoyalMyChart in the search bar and download the app. The app is free but as with any other app downloads, their phone may require them to verify saved payment information or  Apple/Android password.  - The patient will need to then log into the app with their MyChart username and password, and select [facility] as their healthcare provider to link the account. When it is time for your visit, go to the MyChart app, find appointments, and click Begin Video Visit. Be sure to Select Allow for your device to access the Microphone and Camera for your visit. You will then be connected, and your provider will be with you shortly.  **If they have any issues connecting, or need assistance please contact MyChart service desk [phone])**  **If using a computer, in order to ensure the best quality for their visit they will need to use either of the following Internet Browsers: [employer] Edge, or Google Chrome**  IF USING DOXIMITY or DOXY.ME - The patient will receive a link just prior to their visit by text.     FULL LENGTH CONSENT FOR TELE-HEALTH VISIT   Jonathon Middleton hereby voluntarily request, consent and authorize [facility] and its employed or contracted physicians, physician assistants, nurse practitioners or other licensed health care professionals (the Practitioner), to provide me with telemedicine health care services (the "Services") as deemed necessary by the treating Practitioner. Jonathon Middleton acknowledge and consent to receive the Services by the Practitioner via telemedicine. Jonathon Middleton understand that the telemedicine visit will involve communicating with the Practitioner through live audiovisual communication technology and the disclosure of certain medical information by electronic transmission. Jonathon Middleton acknowledge that Jonathon Middleton have been given the opportunity to request an in-person assessment or other available alternative prior to the telemedicine visit and am voluntarily participating in the telemedicine visit.  Jonathon Middleton understand that Jonathon Middleton have the right to withhold or withdraw my consent to the use of telemedicine in the course of my care at any time, without affecting my right to future care  or treatment, and that the Practitioner or Jonathon Middleton may terminate the telemedicine visit at any time. Jonathon Middleton understand that Jonathon Middleton have the right to inspect all information obtained and/or recorded in the course of the telemedicine visit and may receive copies of available information for a reasonable fee.  Jonathon Middleton understand that some of the potential risks of receiving the Services via telemedicine include:  Marland Kitchen. Delay or interruption in medical evaluation due to technological equipment failure or disruption; . Information transmitted may not be sufficient (e.g. poor resolution of images) to allow for appropriate medical decision making by the Practitioner; and/or  . In rare instances, security protocols could fail, causing a breach of personal health information.  Furthermore, Jonathon Middleton acknowledge that it is my responsibility to provide information about my medical history, conditions and care that is complete and accurate to the best of my ability. Jonathon Middleton acknowledge that Practitioner's advice, recommendations, and/or decision may be based on factors not within their control, such as incomplete or inaccurate data provided by me or distortions of diagnostic images or specimens that may result from electronic transmissions. Jonathon Middleton understand that the practice  of medicine is not an Chief Strategy Officer and that Practitioner makes no warranties or guarantees regarding treatment outcomes. Jonathon Middleton acknowledge that Jonathon Middleton will receive a copy of this consent concurrently upon execution via email to the email address Jonathon Middleton last provided but may also request a printed copy by calling the office of [place].    Jonathon Middleton understand that my insurance will be billed for this visit.   Jonathon Middleton have read or had this consent read to me. . Jonathon Middleton understand the contents of this consent, which adequately explains the benefits and risks of the Services being provided via telemedicine.  . Jonathon Middleton have been provided ample opportunity to ask questions regarding this consent and the Services and have had  my questions answered to my satisfaction. . Jonathon Middleton give my informed consent for the services to be provided through the use of telemedicine in my medical care  By participating in this telemedicine visit Jonathon Middleton agree to the above.

## 2019-05-23 ENCOUNTER — Ambulatory Visit (INDEPENDENT_AMBULATORY_CARE_PROVIDER_SITE_OTHER): Payer: PRIVATE HEALTH INSURANCE | Admitting: Psychology

## 2019-05-23 DIAGNOSIS — F4323 Adjustment disorder with mixed anxiety and depressed mood: Secondary | ICD-10-CM

## 2019-06-02 NOTE — Progress Notes (Signed)
Cardiology Office Note  Date:  06/04/2019   ID:  Jonathon Middleton, DOB 06-29-1951, MRN [id number]  PCP:  Eustaquio BoydenGutierrez, Javier, MD   Chief Complaint  Patient presents with  . Follow up Visit    FU visit no complaints    HPI:  Jonathon Middleton is a very pleasant 68 year old gentleman with  smoking history for 5 years who stopped in 1984, PAF, 2014, 2017 , 2019, Chads vasc score of 1.  who presents for follow-up of his atrial fibrillation.  Stopped several of his medications recently to see what would happen Stopped amiodarone, shortly after developed atrial fibrillation Stopped his Lipitor, total cholesterol up to 270s Now back on both medications  BP at home 120 up to 130 systolic Typically runs higher in the office such as today 145 systolic  If he does have breakthrough atrial fibrillation typically takes extra amiodarone x1 in several hours will go away Sometimes atrial fibrillation up to 12 hours  Discussed risk factors, remote smoking, borderline sugar weight gain, high cholesterol No prior screening studies available  EKG personally reviewed by myself on todays visit Shows normal sinus rhythm with rate 57 bpm no significant ST or T wave changes   Other past medical history reviewed Sept 2018 fell off ladder Broke humerus on the right rebroke his arm, "overdoing it"  surgery again   father had heart attack at age 67 and died. He was a long-time smoker. Mother lived to 3286  PMH:   has a past medical history of Arthritis, Atrial fibrillation (HCC), Depression, Depression with anxiety, GERD (gastroesophageal reflux disease), History of chicken pox, Humerus fracture, Hyperlipidemia, Hypertension, Nonalcoholic fatty liver disease, Pre-diabetes, and Prediabetes.  PSH:    Past Surgical History:  Procedure Laterality Date  . COLONOSCOPY    . HARDWARE REMOVAL Right 09/21/2017   Procedure: HARDWARE REMOVAL OF RIGHT HUMERUS;  Surgeon: Tarry KosXu, Naiping M, MD;  Location: [facility] OR;  Service:  Orthopedics;  Laterality: Right;  . KNEE SURGERY Right 2009   x 2 (Dr. Ernest PineHooten)  Arthroscopy- torn MCL  . ORIF HUMERUS FRACTURE Right 08/25/2017   Procedure: OPEN REDUCTION INTERNAL FIXATION (ORIF) RIGHT HUMERAL SHAFT FRACTURE;  Surgeon: Tarry KosXu, Naiping M, MD;  Location: [facility] OR;  Service: Orthopedics;  Laterality: Right;  . ORIF HUMERUS FRACTURE Right 09/21/2017   Procedure: OPEN REDUCTION INTERNAL FIXATION (ORIF) RIGHT HUMERAL SHAFT FRACTURE;  Surgeon: Tarry KosXu, Naiping M, MD;  Location: [facility] OR;  Service: Orthopedics;  Laterality: Right;    Current Outpatient Medications  Medication Sig Dispense Refill  . amiodarone (PACERONE) 200 MG tablet Take 1 tablet (200 mg total) by mouth daily. 90 tablet 1  . apixaban (ELIQUIS) 5 MG TABS tablet Take 1 tablet (5 mg total) by mouth 2 (two) times daily. 180 tablet 1  . atorvastatin (LIPITOR) 40 MG tablet Take 1 tablet (40 mg total) by mouth at bedtime. 90 tablet 1  . Cholecalciferol (VITAMIN D) 2000 units CAPS Take 2,000 Units by mouth 2 (two) times daily.     . Coenzyme Q10 (CO Q-10) 100 MG CAPS Take 100 mg by mouth daily with breakfast.     . diltiazem (CARDIZEM CD) 120 MG 24 hr capsule Take 1 capsule (120 mg total) by mouth daily. 90 capsule 1  . furosemide (LASIX) 20 MG tablet Take 1 tablet (20 mg total) by mouth daily as needed. 90 tablet 3  . glucose blood (ONE TOUCH ULTRA TEST) test strip Use to check sugar once daily and as needed. Dx: E11.9 100 each  3  . Lancets (ONETOUCH ULTRASOFT) lancets USE AS INSTRUCTED 100 each 0  . losartan (COZAAR) 100 MG tablet Take 0.5 tablets (50 mg total) by mouth daily. 45 tablet 1  . Melatonin 10 MG TABS Take 10 mg by mouth at bedtime.     . metoprolol succinate (TOPROL-XL) 50 MG 24 hr tablet Take 1 tablet (50 mg total) by mouth daily. 90 tablet 1  . Multiple Vitamin (MULTIVITAMIN WITH MINERALS) TABS tablet Take 1 tablet by mouth daily with breakfast.    . omega-3 fish oil (MAXEPA) 1000 MG CAPS capsule Take 1 capsule by mouth 2  (two) times daily.     . sertraline (ZOLOFT) 100 MG tablet Take 1 tablet (100 mg total) by mouth daily. 90 tablet 1   No current facility-administered medications for this visit.      Allergies:   Patient has no known allergies.   Social History:  The patient  reports that he has quit smoking. His smoking use included cigars. He has a 5.00 pack-year smoking history. He has never used smokeless tobacco. He reports current alcohol use of about 2.0 standard drinks of alcohol per week. He reports that he does not use drugs.   Family History:   family history includes CAD (age of onset: 3042) in his father; CAD (age of onset: 3759) in his paternal uncle; Cancer in his maternal uncle; Diabetes in his paternal grandmother; Hypertension in his mother; Stroke in his paternal aunt.    Review of Systems: Review of Systems  Constitutional: Negative.        Weight gain  Respiratory: Negative.   Cardiovascular: Negative.   Gastrointestinal: Negative.   Musculoskeletal: Negative.   Neurological: Negative.   Psychiatric/Behavioral: Negative.   All other systems reviewed and are negative.    PHYSICAL EXAM: VS:  BP (!) 145/85 (BP Location: Left Arm, Patient Position: Sitting, Cuff Size: Normal)   Pulse (!) 57   Resp 19   Ht 5\' 11"  (1.803 m)   Wt 223 lb 12 oz (101.5 kg)   BMI 31.21 kg/m  , BMI Body mass index is 31.21 kg/m. Constitutional:  oriented to person, place, and time. No distress.  HENT:  Head: Grossly normal Eyes:  no discharge. No scleral icterus.  Neck: No JVD, no carotid bruits  Cardiovascular: Regular rate and rhythm, no murmurs appreciated Pulmonary/Chest: Clear to auscultation bilaterally, no wheezes or rails Abdominal: Soft.  no distension.  no tenderness.  Musculoskeletal: Normal range of motion Neurological:  normal muscle tone. Coordination normal. No atrophy Skin: Skin warm and dry Psychiatric: normal affect, pleasant   Recent Labs: 04/22/2019: ALT 22; BUN 19;  Creatinine, Ser 1.25; Hemoglobin 14.8; Platelets 215.0; Potassium 4.6; Sodium 142; TSH 2.32    Lipid Panel Lab Results  Component Value Date   CHOL 277 (H) 04/22/2019   HDL 35.20 (L) 04/22/2019   LDLCALC 57 06/01/2016   TRIG 262.0 (H) 04/22/2019      Wt Readings from Last 3 Encounters:  06/04/19 223 lb 12 oz (101.5 kg)  04/19/19 221 lb 8 oz (100.5 kg)  04/04/18 224 lb (101.6 kg)       ASSESSMENT AND PLAN:  Paroxysmal atrial fibrillation (HCC) - Plan: EKG 12-Lead Recently stopped his amiodarone, had breakthrough atrial fibrillation now back on amiodarone Compliant with anticoagulation Continue diltiazem and metoprolol  Hyperlipidemia Held his Lipitor on his own numbers markedly elevated total cholesterol 277 now back on cholesterol medication Recommended weight loss  Essential hypertension Reports is well controlled  at home, typically elevated in the office No changes made to his medications  Smoking history Denied smoking Reports that he stopped many years ago  Controlled type 2 diabetes mellitus without complication, without long-term current use of insulin (HCC) Recommended low carbohydrate diet, weight loss  Preventive care Discussed CT coronary calcium scoring with him, information provided on the study Might be a good idea to do the study given his strong family history, father died early age from heart attack, he will call us if he would like it ordered   Total encounter time more than 25 minutes  Greater than 50% was spent in counseling and coordination of care with the patient   Disposition:   F/U  12 months   No orders of the defined types were placed in this encounter.    Signed, Esmond Plants, M.D., Ph.D. 06/04/2019  [place], [place]

## 2019-06-03 ENCOUNTER — Telehealth: Payer: Self-pay | Admitting: Cardiovascular Disease

## 2019-06-03 NOTE — Telephone Encounter (Signed)

## 2019-06-04 ENCOUNTER — Encounter: Payer: Self-pay | Admitting: Cardiovascular Disease

## 2019-06-04 ENCOUNTER — Other Ambulatory Visit: Payer: Self-pay

## 2019-06-04 ENCOUNTER — Ambulatory Visit (INDEPENDENT_AMBULATORY_CARE_PROVIDER_SITE_OTHER): Payer: PRIVATE HEALTH INSURANCE | Admitting: Cardiovascular Disease

## 2019-06-04 VITALS — BP 145/85 | HR 57 | Temp 97.6°F | Resp 19 | Ht 71.0 in | Wt 223.8 lb

## 2019-06-04 DIAGNOSIS — I10 Essential (primary) hypertension: Secondary | ICD-10-CM | POA: Diagnosis not present

## 2019-06-04 DIAGNOSIS — E782 Mixed hyperlipidemia: Secondary | ICD-10-CM | POA: Diagnosis not present

## 2019-06-04 DIAGNOSIS — I480 Paroxysmal atrial fibrillation: Secondary | ICD-10-CM | POA: Diagnosis not present

## 2019-06-04 DIAGNOSIS — E119 Type 2 diabetes mellitus without complications: Secondary | ICD-10-CM | POA: Diagnosis not present

## 2019-06-04 DIAGNOSIS — Z87891 Personal history of nicotine dependence: Secondary | ICD-10-CM

## 2019-06-04 NOTE — Addendum Note (Signed)
Addended by: Valora Corporal on: 06/04/2019 08:33 AM   Modules accepted: Orders

## 2019-06-04 NOTE — Patient Instructions (Addendum)
Info on CT coronary calcium score  Medication Instructions:  No changes  If you need a refill on your cardiac medications before your next appointment, please call your pharmacy.    Lab work: No new labs needed   If you have labs (blood work) drawn today and your tests are completely normal, you will receive your results only by: Marland Kitchen MyChart Message (if you have MyChart) OR . A paper copy in the mail If you have any lab test that is abnormal or we need to change your treatment, we will call you to review the results.   Testing/Procedures: No new testing needed   Follow-Up: At [facility], you and your health needs are our priority.  As part of our continuing mission to provide you with exceptional heart care, we have created designated Provider Care Teams.  These Care Teams include your primary Cardiologist (physician) and Advanced Practice Providers (APPs -  Physician Assistants and Nurse Practitioners) who all work together to provide you with the care you need, when you need it.  . You will need a follow up appointment in 12 months .   Please call our office 2 months in advance to schedule this appointment.    . Providers on your designated Care Team:   . Murray Hodgkins, NP . Christell Faith, PA-C . Marrianne Mood, PA-C  Any Other Special Instructions Will Be Listed Below (If Applicable).  For educational health videos Log in to : [email or web address] Or : [email or web address], password : [email or web address]

## 2019-06-06 ENCOUNTER — Ambulatory Visit (INDEPENDENT_AMBULATORY_CARE_PROVIDER_SITE_OTHER): Payer: PRIVATE HEALTH INSURANCE | Admitting: Psychology

## 2019-06-06 DIAGNOSIS — F4323 Adjustment disorder with mixed anxiety and depressed mood: Secondary | ICD-10-CM | POA: Diagnosis not present

## 2019-06-20 ENCOUNTER — Ambulatory Visit (INDEPENDENT_AMBULATORY_CARE_PROVIDER_SITE_OTHER): Payer: PRIVATE HEALTH INSURANCE | Admitting: Psychology

## 2019-06-20 DIAGNOSIS — F4323 Adjustment disorder with mixed anxiety and depressed mood: Secondary | ICD-10-CM

## 2019-07-04 ENCOUNTER — Ambulatory Visit (INDEPENDENT_AMBULATORY_CARE_PROVIDER_SITE_OTHER): Payer: PRIVATE HEALTH INSURANCE | Admitting: Psychology

## 2019-07-04 DIAGNOSIS — F4323 Adjustment disorder with mixed anxiety and depressed mood: Secondary | ICD-10-CM | POA: Diagnosis not present

## 2019-07-18 ENCOUNTER — Ambulatory Visit (INDEPENDENT_AMBULATORY_CARE_PROVIDER_SITE_OTHER): Payer: PRIVATE HEALTH INSURANCE | Admitting: Psychology

## 2019-07-18 DIAGNOSIS — F4323 Adjustment disorder with mixed anxiety and depressed mood: Secondary | ICD-10-CM | POA: Diagnosis not present

## 2019-08-01 ENCOUNTER — Ambulatory Visit (INDEPENDENT_AMBULATORY_CARE_PROVIDER_SITE_OTHER): Payer: PRIVATE HEALTH INSURANCE | Admitting: Psychology

## 2019-08-01 DIAGNOSIS — F4323 Adjustment disorder with mixed anxiety and depressed mood: Secondary | ICD-10-CM | POA: Diagnosis not present

## 2019-08-15 ENCOUNTER — Ambulatory Visit (INDEPENDENT_AMBULATORY_CARE_PROVIDER_SITE_OTHER): Payer: PRIVATE HEALTH INSURANCE | Admitting: Psychology

## 2019-08-15 DIAGNOSIS — F4323 Adjustment disorder with mixed anxiety and depressed mood: Secondary | ICD-10-CM

## 2019-08-29 ENCOUNTER — Ambulatory Visit (INDEPENDENT_AMBULATORY_CARE_PROVIDER_SITE_OTHER): Payer: PRIVATE HEALTH INSURANCE | Admitting: Psychology

## 2019-08-29 DIAGNOSIS — F4323 Adjustment disorder with mixed anxiety and depressed mood: Secondary | ICD-10-CM | POA: Diagnosis not present

## 2019-09-12 ENCOUNTER — Ambulatory Visit (INDEPENDENT_AMBULATORY_CARE_PROVIDER_SITE_OTHER): Payer: PRIVATE HEALTH INSURANCE | Admitting: Psychology

## 2019-09-12 DIAGNOSIS — F4323 Adjustment disorder with mixed anxiety and depressed mood: Secondary | ICD-10-CM

## 2019-09-26 ENCOUNTER — Ambulatory Visit (INDEPENDENT_AMBULATORY_CARE_PROVIDER_SITE_OTHER): Payer: PRIVATE HEALTH INSURANCE | Admitting: Psychology

## 2019-09-26 DIAGNOSIS — F4323 Adjustment disorder with mixed anxiety and depressed mood: Secondary | ICD-10-CM

## 2019-10-10 ENCOUNTER — Encounter: Payer: Self-pay | Admitting: Family Medicine

## 2019-10-10 ENCOUNTER — Ambulatory Visit (INDEPENDENT_AMBULATORY_CARE_PROVIDER_SITE_OTHER): Payer: PRIVATE HEALTH INSURANCE | Admitting: Psychology

## 2019-10-10 ENCOUNTER — Other Ambulatory Visit: Payer: Self-pay

## 2019-10-10 ENCOUNTER — Ambulatory Visit (INDEPENDENT_AMBULATORY_CARE_PROVIDER_SITE_OTHER): Payer: PRIVATE HEALTH INSURANCE | Admitting: Family Medicine

## 2019-10-10 VITALS — BP 126/78 | HR 68 | Temp 97.8°F | Ht 71.0 in | Wt 223.2 lb

## 2019-10-10 DIAGNOSIS — Z23 Encounter for immunization: Secondary | ICD-10-CM | POA: Diagnosis not present

## 2019-10-10 DIAGNOSIS — I10 Essential (primary) hypertension: Secondary | ICD-10-CM | POA: Diagnosis not present

## 2019-10-10 DIAGNOSIS — E119 Type 2 diabetes mellitus without complications: Secondary | ICD-10-CM

## 2019-10-10 DIAGNOSIS — F331 Major depressive disorder, recurrent, moderate: Secondary | ICD-10-CM

## 2019-10-10 DIAGNOSIS — F4323 Adjustment disorder with mixed anxiety and depressed mood: Secondary | ICD-10-CM

## 2019-10-10 DIAGNOSIS — E782 Mixed hyperlipidemia: Secondary | ICD-10-CM

## 2019-10-10 DIAGNOSIS — F4321 Adjustment disorder with depressed mood: Secondary | ICD-10-CM | POA: Diagnosis not present

## 2019-10-10 DIAGNOSIS — E669 Obesity, unspecified: Secondary | ICD-10-CM

## 2019-10-10 DIAGNOSIS — I480 Paroxysmal atrial fibrillation: Secondary | ICD-10-CM

## 2019-10-10 LAB — POCT GLYCOSYLATED HEMOGLOBIN (HGB A1C): Hemoglobin A1C: 6.1 % — AB (ref 4.0–5.6)

## 2019-10-10 LAB — BASIC METABOLIC PANEL
BUN: 23 mg/dL (ref 6–23)
CO2: 29 mEq/L (ref 19–32)
Calcium: 9.2 mg/dL (ref 8.4–10.5)
Chloride: 105 mEq/L (ref 96–112)
Creatinine, Ser: 1.15 mg/dL (ref 0.40–1.50)
GFR: 63.43 mL/min (ref >60.00)
Glucose, Bld: 127 mg/dL — ABNORMAL HIGH (ref 70–99)
Potassium: 4.2 mEq/L (ref 3.5–5.1)
Sodium: 141 mEq/L (ref 135–145)

## 2019-10-10 LAB — LIPID PANEL
Cholesterol: 122 mg/dL (ref 0–200)
HDL: 44.5 mg/dL (ref >39.00)
LDL Cholesterol: 61 mg/dL (ref 0–99)
NonHDL: 77.82
Total CHOL/HDL Ratio: 3
Triglycerides: 84 mg/dL (ref 0.0–149.0)
VLDL: 16.8 mg/dL (ref 0.0–40.0)

## 2019-10-10 MED ORDER — SAXENDA 18 MG/3ML ~~LOC~~ SOPN: PEN_INJECTOR | SUBCUTANEOUS | 1 refills | Status: DC

## 2019-10-10 MED ORDER — SERTRALINE HCL 100 MG PO TABS: 100.0000 mg | ORAL_TABLET | Freq: Every day | ORAL | 3 refills | Status: DC

## 2019-10-10 MED ORDER — LOSARTAN POTASSIUM 50 MG PO TABS: 50.0000 mg | ORAL_TABLET | Freq: Every day | ORAL | 3 refills | Status: DC

## 2019-10-10 NOTE — Patient Instructions (Addendum)
Flu shot today Prevnar today.  Work on [employer] at home. If interested, check with pharmacy about new 2 shot shingles series (shingrix).  Labs today Price out saxenda at pharmacy Return in 1 month for follow up visit.

## 2019-10-10 NOTE — Assessment & Plan Note (Signed)
Pt interested in pharmacotherapy - will price out saxenda. RTC 1 mo f/u visit.

## 2019-10-10 NOTE — Assessment & Plan Note (Signed)
Continues sertraline, continues seeing psychologist.

## 2019-10-10 NOTE — Progress Notes (Signed)
This visit was conducted in person.  BP 126/78 (BP Location: Left Arm, Patient Position: Sitting, Cuff Size: Large)   Pulse 68   Temp 97.8 F (36.6 C) (Temporal)   Ht 5' 11"  (1.803 m)   Wt 223 lb 4 oz (101.3 kg)   SpO2 95%   BMI 31.14 kg/m    CC: 6 mo f/u visit Subjective:    Patient ID: Jonathon Middleton, male    DOB: 02-20-1951, 68 y.o.   MRN: [id number]  HPI: Jonathon Middleton is a 68 y.o. male presenting on 10/10/2019 for Follow-up (Here for 6 mo f/u.  Requests to go back to losartan 50 mg tab. )   Atrial fibrillation - back on amiodarone, eliquis, toprol XL.   Obesity - interested in weight loss. Started working out on treadmill. Works on [employer].   MDD - continues sertraline, continues seeing Product/process development scientist.   HTN - Compliant with current antihypertensive regimen of toprol XL, diltiazem. Does check blood pressures at home: good control at home as well. No low blood pressure readings or symptoms of dizziness/syncope. Denies HA, vision changes, CP/tightness, SOB, leg swelling.    DM - does regularly check sugars fasting - good control. Compliant with antihyperglycemic regimen which includes: diet controlled. Previously on byetta. Denies low sugars or hypoglycemic symptoms. Denies paresthesias. Last diabetic eye exam appt scheduled this month. Pneumovax: 10/2013. Prevnar: DUE. Glucometer brand: Accuchek 2. DSME: remotely. Lab Results  Component Value Date   HGBA1C 6.1 (A) 10/10/2019   Diabetic Foot Exam - Simple   Simple Foot Form Diabetic Foot exam was performed with the following findings: Yes 10/10/2019  8:29 AM  Visual Inspection No deformities, no ulcerations, no other skin breakdown bilaterally: Yes Sensation Testing Intact to touch and monofilament testing bilaterally: Yes Pulse Check Posterior Tibialis and Dorsalis pulse intact bilaterally: Yes Comments Mildly diminished pulses bilaterally Callus L medial great toe    Lab Results  Component Value Date   MICROALBUR 0.9 09/29/2017         Relevant past medical, surgical, family and social history reviewed and updated as indicated. Interim medical history since our last visit reviewed. Allergies and medications reviewed and updated. Outpatient Medications Prior to Visit  Medication Sig Dispense Refill  . amiodarone (PACERONE) 200 MG tablet Take 1 tablet (200 mg total) by mouth daily. 90 tablet 1  . apixaban (ELIQUIS) 5 MG TABS tablet Take 1 tablet (5 mg total) by mouth 2 (two) times daily. 180 tablet 1  . atorvastatin (LIPITOR) 40 MG tablet Take 1 tablet (40 mg total) by mouth at bedtime. 90 tablet 1  . Cholecalciferol (VITAMIN D) 2000 units CAPS Take 2,000 Units by mouth 2 (two) times daily.     . Coenzyme Q10 (CO Q-10) 100 MG CAPS Take 100 mg by mouth daily with breakfast.     . diltiazem (CARDIZEM CD) 120 MG 24 hr capsule Take 1 capsule (120 mg total) by mouth daily. 90 capsule 1  . glucose blood (ONE TOUCH ULTRA TEST) test strip Use to check sugar once daily and as needed. Dx: E11.9 100 each 3  . Lancets (ONETOUCH ULTRASOFT) lancets USE AS INSTRUCTED 100 each 0  . Melatonin 10 MG TABS Take 10 mg by mouth at bedtime.     . metoprolol succinate (TOPROL-XL) 50 MG 24 hr tablet Take 1 tablet (50 mg total) by mouth daily. 90 tablet 1  . Multiple Vitamin (MULTIVITAMIN WITH MINERALS) TABS tablet Take 1 tablet by mouth daily with breakfast.    .  omega-3 fish oil (MAXEPA) 1000 MG CAPS capsule Take 1 capsule by mouth 2 (two) times daily.     Marland Kitchen losartan (COZAAR) 100 MG tablet Take 0.5 tablets (50 mg total) by mouth daily. 45 tablet 1  . sertraline (ZOLOFT) 100 MG tablet Take 1 tablet (100 mg total) by mouth daily. 90 tablet 1  . furosemide (LASIX) 20 MG tablet Take 1 tablet (20 mg total) by mouth daily as needed. 90 tablet 3   No facility-administered medications prior to visit.      Per HPI unless specifically indicated in ROS section below Review of Systems Objective:    BP 126/78 (BP  Location: Left Arm, Patient Position: Sitting, Cuff Size: Large)   Pulse 68   Temp 97.8 F (36.6 C) (Temporal)   Ht 5' 11"  (1.803 m)   Wt 223 lb 4 oz (101.3 kg)   SpO2 95%   BMI 31.14 kg/m   Wt Readings from Last 3 Encounters:  10/10/19 223 lb 4 oz (101.3 kg)  06/04/19 223 lb 12 oz (101.5 kg)  04/19/19 221 lb 8 oz (100.5 kg)    Physical Exam Vitals signs and nursing note reviewed.  Constitutional:      General: He is not in acute distress.    Appearance: He is well-developed.  HENT:     Head: Normocephalic and atraumatic.     Right Ear: External ear normal.     Left Ear: External ear normal.     Nose: Nose normal.     Mouth/Throat:     Pharynx: No oropharyngeal exudate.  Eyes:     General: No scleral icterus.    Conjunctiva/sclera: Conjunctivae normal.     Pupils: Pupils are equal, round, and reactive to light.  Neck:     Musculoskeletal: Normal range of motion and neck supple.  Cardiovascular:     Rate and Rhythm: Normal rate and regular rhythm.     Heart sounds: Normal heart sounds. No murmur.  Pulmonary:     Effort: Pulmonary effort is normal. No respiratory distress.     Breath sounds: Normal breath sounds. No wheezing or rales.  Musculoskeletal:     Comments: See HPI for foot exam if done  Lymphadenopathy:     Cervical: No cervical adenopathy.  Skin:    General: Skin is warm and dry.     Findings: No rash.       Results for orders placed or performed in visit on 10/10/19  POCT glycosylated hemoglobin (Hb A1C)  Result Value Ref Range   Hemoglobin A1C 6.1 (A) 4.0 - 5.6 %   HbA1c POC (<> result, manual entry)     HbA1c, POC (prediabetic range)     HbA1c, POC (controlled diabetic range)     Assessment & Plan:   Problem List Items Addressed This Visit    Paroxysmal atrial fibrillation ([place])    Sounds regular today - continue eliquis, amiodarone, dilt and toprol xl      Relevant Medications   losartan (COZAAR) 50 MG tablet   Obesity, Class I, BMI  30-34.9    Pt interested in pharmacotherapy - will price out saxenda. RTC 1 mo f/u visit.       MDD (major depressive disorder), recurrent episode ([place])    Continues sertraline, continues seeing psychologist.       Relevant Medications   sertraline (ZOLOFT) 100 MG tablet   Hyperlipidemia    Compliant with statin - update FLP.       Relevant Medications  losartan (COZAAR) 50 MG tablet   Essential hypertension    Chronic, stable on current regimen - continue.       Relevant Medications   losartan (COZAAR) 50 MG tablet   Diabetes mellitus type 2, controlled, without complications ([place]) - Primary    Well controlled with diet. Remains off medication. Discussed possible nutritionist referral. Discussed saxenda for weight loss.       Relevant Medications   losartan (COZAAR) 50 MG tablet   Other Relevant Orders   POCT glycosylated hemoglobin (Hb A1C) (Completed)   Adjustment disorder with depressed mood    Continues sertraline, continues seeing psychologist.        Other Visit Diagnoses    Need for influenza vaccination       Relevant Orders   Flu Vaccine QUAD High Dose(Fluad) (Completed)   Need for vaccination with 13-polyvalent pneumococcal conjugate vaccine       Relevant Orders   Pneumococcal conjugate vaccine 13-valent IM (Completed)       Meds ordered this encounter  Medications  . losartan (COZAAR) 50 MG tablet    Sig: Take 1 tablet (50 mg total) by mouth daily.    Dispense:  90 tablet    Refill:  3  . sertraline (ZOLOFT) 100 MG tablet    Sig: Take 1 tablet (100 mg total) by mouth daily.    Dispense:  90 tablet    Refill:  3  . Liraglutide -Weight Management (SAXENDA) 18 MG/3ML SOPN    Sig: Inject 0.6 mg into the skin daily for 7 days, THEN 1.2 mg daily for 7 days, THEN 1.8 mg daily.    Dispense:  3 pen    Refill:  1   Orders Placed This Encounter  Procedures  . Flu Vaccine QUAD High Dose(Fluad)  . Pneumococcal conjugate vaccine 13-valent IM  . POCT  glycosylated hemoglobin (Hb A1C)    Patient Instructions  Flu shot today Prevnar today.  Work on [employer] at home. If interested, check with pharmacy about new 2 shot shingles series (shingrix).  Labs today Price out saxenda at pharmacy Return in 1 month for follow up visit.    Follow up plan: Return in about 6 months (around 04/08/2020) for annual exam, prior fasting for blood work.  Ria Bush, MD

## 2019-10-10 NOTE — Assessment & Plan Note (Addendum)
Sounds regular today - continue eliquis, amiodarone, dilt and toprol xl

## 2019-10-10 NOTE — Assessment & Plan Note (Addendum)
Well controlled with diet. Remains off medication. Discussed possible nutritionist referral. Discussed saxenda for weight loss.

## 2019-10-10 NOTE — Assessment & Plan Note (Signed)
Compliant with statin - update FLP.

## 2019-10-10 NOTE — Assessment & Plan Note (Signed)
Continues sertraline, continues seeing psychologist.  

## 2019-10-10 NOTE — Assessment & Plan Note (Signed)
Chronic, stable on current regimen - continue. 

## 2019-10-11 ENCOUNTER — Other Ambulatory Visit: Payer: Self-pay | Admitting: Family Medicine

## 2019-10-11 NOTE — Telephone Encounter (Signed)
E-scribed refills.  

## 2019-10-24 ENCOUNTER — Ambulatory Visit (INDEPENDENT_AMBULATORY_CARE_PROVIDER_SITE_OTHER): Payer: PRIVATE HEALTH INSURANCE | Admitting: Psychology

## 2019-10-24 DIAGNOSIS — F4323 Adjustment disorder with mixed anxiety and depressed mood: Secondary | ICD-10-CM | POA: Diagnosis not present

## 2019-11-07 ENCOUNTER — Ambulatory Visit (INDEPENDENT_AMBULATORY_CARE_PROVIDER_SITE_OTHER): Payer: PRIVATE HEALTH INSURANCE | Admitting: Psychology

## 2019-11-07 DIAGNOSIS — F4323 Adjustment disorder with mixed anxiety and depressed mood: Secondary | ICD-10-CM | POA: Diagnosis not present

## 2019-11-11 ENCOUNTER — Other Ambulatory Visit: Payer: Self-pay

## 2019-11-11 ENCOUNTER — Encounter: Payer: Self-pay | Admitting: Family Medicine

## 2019-11-11 ENCOUNTER — Ambulatory Visit (INDEPENDENT_AMBULATORY_CARE_PROVIDER_SITE_OTHER): Payer: PRIVATE HEALTH INSURANCE | Admitting: Family Medicine

## 2019-11-11 VITALS — BP 150/80 | HR 62 | Temp 97.6°F | Ht 71.0 in | Wt 220.5 lb

## 2019-11-11 DIAGNOSIS — I10 Essential (primary) hypertension: Secondary | ICD-10-CM | POA: Diagnosis not present

## 2019-11-11 DIAGNOSIS — E119 Type 2 diabetes mellitus without complications: Secondary | ICD-10-CM | POA: Diagnosis not present

## 2019-11-11 DIAGNOSIS — E669 Obesity, unspecified: Secondary | ICD-10-CM

## 2019-11-11 MED ORDER — VICTOZA 18 MG/3ML ~~LOC~~ SOPN: PEN_INJECTOR | SUBCUTANEOUS | 1 refills | Status: DC

## 2019-11-11 MED ORDER — LOSARTAN POTASSIUM 50 MG PO TABS: 50.0000 mg | ORAL_TABLET | Freq: Every day | ORAL | 1 refills | Status: DC

## 2019-11-11 NOTE — Assessment & Plan Note (Signed)
saxenda unaffordable. Price out victoza. RTC 2 mo f/u visit.

## 2019-11-11 NOTE — Progress Notes (Signed)
This visit was conducted in person.  BP (!) 150/80 (BP Location: Right Arm, Patient Position: Sitting, Cuff Size: Large)   Pulse 62   Temp 97.6 F (36.4 C) (Temporal)   Ht 5\' 11"  (1.803 m)   Wt 220 lb 8 oz (100 kg)   SpO2 97%   BMI 30.75 kg/m   BP elevated on recheck  CC: 1 mo obesity f/u visit  Subjective:    Patient ID: Jonathon Middleton, male    DOB: 09-Apr-1951, 68 y.o.   MRN: [id number]  HPI: Jonathon Gublerevin Fors is a 68 y.o. male presenting on 11/11/2019 for Follow-up (Here for 1 mo wt f/u.)   See prior note for details.  Last visit we started saxenda for weight loss - this was unaffordable. Will price out victoza.   Continues regular treadmill use and working towards healthy diet.   Starting weight 223 lbs (10/10/2019) Today's weight 220 lbs (11/11/2019)  Notes BP elevated recently and brings log - 110-150/60-80. Only taking 25mg  losartan, he continues taking toprol XL 50mg  daily. Denies HA, vision changes, CP/tightness, SOB, leg swelling.  BP Readings from Last 3 Encounters:  11/11/19 (!) 150/80  10/10/19 126/78  06/04/19 (!) 145/85        Relevant past medical, surgical, family and social history reviewed and updated as indicated. Interim medical history since our last visit reviewed. Allergies and medications reviewed and updated. Outpatient Medications Prior to Visit  Medication Sig Dispense Refill  . amiodarone (PACERONE) 200 MG tablet Take 1 tablet (200 mg total) by mouth daily. 90 tablet 1  . apixaban (ELIQUIS) 5 MG TABS tablet Take 1 tablet (5 mg total) by mouth 2 (two) times daily. 180 tablet 1  . atorvastatin (LIPITOR) 40 MG tablet TAKE 1 TABLET BY MOUTH EVERYDAY AT BEDTIME 90 tablet 3  . Cholecalciferol (VITAMIN D) 2000 units CAPS Take 2,000 Units by mouth 2 (two) times daily.     . Coenzyme Q10 (CO Q-10) 100 MG CAPS Take 100 mg by mouth daily with breakfast.     . diltiazem (CARDIZEM CD) 120 MG 24 hr capsule TAKE 1 CAPSULE BY MOUTH EVERY DAY 90 capsule 3  .  glucose blood (ONE TOUCH ULTRA TEST) test strip Use to check sugar once daily and as needed. Dx: E11.9 100 each 3  . Lancets (ONETOUCH ULTRASOFT) lancets USE AS INSTRUCTED 100 each 0  . Melatonin 10 MG TABS Take 10 mg by mouth at bedtime.     . metoprolol succinate (TOPROL-XL) 50 MG 24 hr tablet TAKE 1 TABLET BY MOUTH EVERY DAY 90 tablet 3  . Multiple Vitamin (MULTIVITAMIN WITH MINERALS) TABS tablet Take 1 tablet by mouth daily with breakfast.    . omega-3 fish oil (MAXEPA) 1000 MG CAPS capsule Take 1 capsule by mouth 2 (two) times daily.     . sertraline (ZOLOFT) 100 MG tablet Take 1 tablet (100 mg total) by mouth daily. 90 tablet 3  . Liraglutide -Weight Management (SAXENDA) 18 MG/3ML SOPN Inject 0.6 mg into the skin daily for 7 days, THEN 1.2 mg daily for 7 days, THEN 1.8 mg daily. 3 pen 1  . losartan (COZAAR) 25 MG tablet Take 25 mg by mouth daily.    Marland Kitchen. losartan (COZAAR) 50 MG tablet Take 1 tablet (50 mg total) by mouth daily. 90 tablet 3   No facility-administered medications prior to visit.      Per HPI unless specifically indicated in ROS section below Review of Systems Objective:    BP Marland Kitchen(!)  150/80 (BP Location: Right Arm, Patient Position: Sitting, Cuff Size: Large)   Pulse 62   Temp 97.6 F (36.4 C) (Temporal)   Ht 5\' 11"  (1.803 m)   Wt 220 lb 8 oz (100 kg)   SpO2 97%   BMI 30.75 kg/m   Wt Readings from Last 3 Encounters:  11/11/19 220 lb 8 oz (100 kg)  10/10/19 223 lb 4 oz (101.3 kg)  06/04/19 223 lb 12 oz (101.5 kg)    Physical Exam Vitals signs and nursing note reviewed.  Constitutional:      Appearance: Normal appearance. He is not ill-appearing.  Cardiovascular:     Rate and Rhythm: Normal rate and regular rhythm.     Pulses: Normal pulses.     Heart sounds: Murmur (faint systolic) present.  Pulmonary:     Effort: Pulmonary effort is normal. No respiratory distress.     Breath sounds: Normal breath sounds. No wheezing, rhonchi or rales.  Neurological:      Mental Status: He is alert.  Psychiatric:        Mood and Affect: Mood normal.        Behavior: Behavior normal.       Results for orders placed or performed in visit on 73/71/06  Basic metabolic panel  Result Value Ref Range   Sodium 141 135 - 145 mEq/L   Potassium 4.2 3.5 - 5.1 mEq/L   Chloride 105 96 - 112 mEq/L   CO2 29 19 - 32 mEq/L   Glucose, Bld 127 (H) 70 - 99 mg/dL   BUN 23 6 - 23 mg/dL   Creatinine, Ser 1.15 0.40 - 1.50 mg/dL   GFR 63.43 >60.00 mL/min   Calcium 9.2 8.4 - 10.5 mg/dL  Lipid panel  Result Value Ref Range   Cholesterol 122 0 - 200 mg/dL   Triglycerides 84.0 0.0 - 149.0 mg/dL   HDL 44.50 >39.00 mg/dL   VLDL 16.8 0.0 - 40.0 mg/dL   LDL Cholesterol 61 0 - 99 mg/dL   Total CHOL/HDL Ratio 3    NonHDL 77.82   POCT glycosylated hemoglobin (Hb A1C)  Result Value Ref Range   Hemoglobin A1C 6.1 (A) 4.0 - 5.6 %   HbA1c POC (<> result, manual entry)     HbA1c, POC (prediabetic range)     HbA1c, POC (controlled diabetic range)     Assessment & Plan:  This visit occurred during the SARS-CoV-2 public health emergency.  Safety protocols were in place, including screening questions prior to the visit, additional usage of staff PPE, and extensive cleaning of exam room while observing appropriate contact time as indicated for disinfecting solutions.   Problem List Items Addressed This Visit    Obesity, Class I, BMI 30-34.9    saxenda unaffordable. Price out victoza. RTC 2 mo f/u visit.       Essential hypertension - Primary    Chronic, deteriorated. Increase losartan to 50mg  daily. Reassess at 2 mo f/u visit.       Relevant Medications   losartan (COZAAR) 50 MG tablet   Diabetes mellitus type 2, controlled, without complications ([place])    Controlled. Price out victoza.       Relevant Medications   losartan (COZAAR) 50 MG tablet   liraglutide (VICTOZA) 18 MG/3ML SOPN       Meds ordered this encounter  Medications  . losartan (COZAAR) 50 MG tablet     Sig: Take 1 tablet (50 mg total) by mouth daily.    Dispense:  90 tablet    Refill:  1    Note new sig  . liraglutide (VICTOZA) 18 MG/3ML SOPN    Sig: Inject 0.1 mLs (0.6 mg total) into the skin daily for 7 days, THEN 0.2 mLs (1.2 mg total) daily for 7 days, THEN 0.3 mLs (1.8 mg total) daily.    Dispense:  3 pen    Refill:  1   No orders of the defined types were placed in this encounter.   Follow up plan: Return in about 4 weeks (around 12/09/2019) for follow up visit.  Eustaquio Boyden, MD

## 2019-11-11 NOTE — Assessment & Plan Note (Signed)
Controlled. Price out victoza.

## 2019-11-11 NOTE — Assessment & Plan Note (Signed)
Chronic, deteriorated. Increase losartan to 50mg  daily. Reassess at 2 mo f/u visit.

## 2019-11-11 NOTE — Patient Instructions (Addendum)
Price out victoza.  Increase losartan to 50mg  daily.  Return in 2 months for follow up visit.  Good to see you today,call us with questions.

## 2019-11-21 ENCOUNTER — Ambulatory Visit (INDEPENDENT_AMBULATORY_CARE_PROVIDER_SITE_OTHER): Payer: Self-pay | Admitting: Psychology

## 2019-11-21 DIAGNOSIS — F4323 Adjustment disorder with mixed anxiety and depressed mood: Secondary | ICD-10-CM

## 2019-12-12 ENCOUNTER — Ambulatory Visit (INDEPENDENT_AMBULATORY_CARE_PROVIDER_SITE_OTHER): Payer: Self-pay | Admitting: Psychology

## 2019-12-12 DIAGNOSIS — F4323 Adjustment disorder with mixed anxiety and depressed mood: Secondary | ICD-10-CM

## 2019-12-14 LAB — HM DIABETES EYE EXAM

## 2019-12-17 ENCOUNTER — Encounter: Payer: Self-pay | Admitting: Family Medicine

## 2019-12-26 ENCOUNTER — Ambulatory Visit (INDEPENDENT_AMBULATORY_CARE_PROVIDER_SITE_OTHER): Payer: Self-pay | Admitting: Psychology

## 2019-12-26 DIAGNOSIS — F4323 Adjustment disorder with mixed anxiety and depressed mood: Secondary | ICD-10-CM

## 2020-01-09 ENCOUNTER — Ambulatory Visit (INDEPENDENT_AMBULATORY_CARE_PROVIDER_SITE_OTHER): Payer: Self-pay | Admitting: Psychology

## 2020-01-09 ENCOUNTER — Other Ambulatory Visit: Payer: Self-pay | Admitting: Family Medicine

## 2020-01-09 DIAGNOSIS — F4323 Adjustment disorder with mixed anxiety and depressed mood: Secondary | ICD-10-CM

## 2020-01-09 NOTE — Telephone Encounter (Signed)
Amiodarone Last filled:  10/14/19, #90 Last OV:  11/11/19, 1 mo f/u Next OV:  04/10/20, AWV prt 2

## 2020-01-23 ENCOUNTER — Ambulatory Visit (INDEPENDENT_AMBULATORY_CARE_PROVIDER_SITE_OTHER): Payer: Self-pay | Admitting: Psychology

## 2020-01-23 DIAGNOSIS — F4323 Adjustment disorder with mixed anxiety and depressed mood: Secondary | ICD-10-CM

## 2020-02-05 LAB — HM DIABETES EYE EXAM

## 2020-02-06 ENCOUNTER — Ambulatory Visit (INDEPENDENT_AMBULATORY_CARE_PROVIDER_SITE_OTHER): Payer: Self-pay | Admitting: Psychology

## 2020-02-06 DIAGNOSIS — F4323 Adjustment disorder with mixed anxiety and depressed mood: Secondary | ICD-10-CM

## 2020-02-07 ENCOUNTER — Encounter: Payer: Self-pay | Admitting: Family Medicine

## 2020-02-20 ENCOUNTER — Ambulatory Visit (INDEPENDENT_AMBULATORY_CARE_PROVIDER_SITE_OTHER): Payer: Self-pay | Admitting: Psychology

## 2020-02-20 DIAGNOSIS — F4323 Adjustment disorder with mixed anxiety and depressed mood: Secondary | ICD-10-CM

## 2020-03-05 ENCOUNTER — Ambulatory Visit (INDEPENDENT_AMBULATORY_CARE_PROVIDER_SITE_OTHER): Payer: Self-pay | Admitting: Psychology

## 2020-03-05 DIAGNOSIS — F4323 Adjustment disorder with mixed anxiety and depressed mood: Secondary | ICD-10-CM

## 2020-03-19 ENCOUNTER — Ambulatory Visit (INDEPENDENT_AMBULATORY_CARE_PROVIDER_SITE_OTHER): Payer: Self-pay | Admitting: Psychology

## 2020-03-19 DIAGNOSIS — F4323 Adjustment disorder with mixed anxiety and depressed mood: Secondary | ICD-10-CM

## 2020-03-30 ENCOUNTER — Ambulatory Visit (INDEPENDENT_AMBULATORY_CARE_PROVIDER_SITE_OTHER): Payer: Self-pay | Admitting: Psychology

## 2020-03-30 DIAGNOSIS — F4323 Adjustment disorder with mixed anxiety and depressed mood: Secondary | ICD-10-CM

## 2020-04-02 ENCOUNTER — Ambulatory Visit: Payer: Self-pay | Admitting: Psychology

## 2020-04-06 ENCOUNTER — Other Ambulatory Visit (INDEPENDENT_AMBULATORY_CARE_PROVIDER_SITE_OTHER): Payer: PRIVATE HEALTH INSURANCE

## 2020-04-06 ENCOUNTER — Ambulatory Visit (INDEPENDENT_AMBULATORY_CARE_PROVIDER_SITE_OTHER): Payer: PRIVATE HEALTH INSURANCE

## 2020-04-06 ENCOUNTER — Other Ambulatory Visit: Payer: Self-pay

## 2020-04-06 ENCOUNTER — Other Ambulatory Visit: Payer: Self-pay | Admitting: Family Medicine

## 2020-04-06 VITALS — BP 117/65 | HR 60 | Wt 209.5 lb

## 2020-04-06 DIAGNOSIS — I480 Paroxysmal atrial fibrillation: Secondary | ICD-10-CM

## 2020-04-06 DIAGNOSIS — R972 Elevated prostate specific antigen [PSA]: Secondary | ICD-10-CM

## 2020-04-06 DIAGNOSIS — E119 Type 2 diabetes mellitus without complications: Secondary | ICD-10-CM

## 2020-04-06 DIAGNOSIS — E782 Mixed hyperlipidemia: Secondary | ICD-10-CM

## 2020-04-06 DIAGNOSIS — Z0000 Encounter for general adult medical examination without abnormal findings: Secondary | ICD-10-CM

## 2020-04-06 LAB — CBC WITH DIFFERENTIAL/PLATELET
Basophils Absolute: 0.1 10*3/uL (ref 0.0–0.1)
Basophils Relative: 1.1 % (ref 0.0–3.0)
Eosinophils Absolute: 0.2 10*3/uL (ref 0.0–0.7)
Eosinophils Relative: 3.3 % (ref 0.0–5.0)
HCT: 42.3 % (ref 39.0–52.0)
Hemoglobin: 14.1 g/dL (ref 13.0–17.0)
Lymphocytes Relative: 26.8 % (ref 12.0–46.0)
Lymphs Abs: 1.4 10*3/uL (ref 0.7–4.0)
MCHC: 33.4 g/dL (ref 30.0–36.0)
MCV: 89.7 fl (ref 78.0–100.0)
Monocytes Absolute: 0.6 10*3/uL (ref 0.1–1.0)
Monocytes Relative: 10.8 % (ref 3.0–12.0)
Neutro Abs: 3 10*3/uL (ref 1.4–7.7)
Neutrophils Relative %: 58 % (ref 43.0–77.0)
Platelets: 185 10*3/uL (ref 150.0–400.0)
RBC: 4.71 Mil/uL (ref 4.22–5.81)
RDW: 14.9 % (ref 11.5–15.5)
WBC: 5.2 10*3/uL (ref 4.0–10.5)

## 2020-04-06 LAB — LIPID PANEL
Cholesterol: 128 mg/dL (ref 0–200)
HDL: 39.1 mg/dL (ref >39.00)
LDL Cholesterol: 70 mg/dL (ref 0–99)
NonHDL: 88.7
Total CHOL/HDL Ratio: 3
Triglycerides: 95 mg/dL (ref 0.0–149.0)
VLDL: 19 mg/dL (ref 0.0–40.0)

## 2020-04-06 LAB — COMPREHENSIVE METABOLIC PANEL
ALT: 30 U/L (ref 0–53)
AST: 25 U/L (ref 0–37)
Albumin: 4.1 g/dL (ref 3.5–5.2)
Alkaline Phosphatase: 109 U/L (ref 39–117)
BUN: 20 mg/dL (ref 6–23)
CO2: 28 mEq/L (ref 19–32)
Calcium: 8.7 mg/dL (ref 8.4–10.5)
Chloride: 107 mEq/L (ref 96–112)
Creatinine, Ser: 1.12 mg/dL (ref 0.40–1.50)
GFR: 65.3 mL/min (ref >60.00)
Glucose, Bld: 130 mg/dL — ABNORMAL HIGH (ref 70–99)
Potassium: 4.1 mEq/L (ref 3.5–5.1)
Sodium: 141 mEq/L (ref 135–145)
Total Bilirubin: 0.7 mg/dL (ref 0.2–1.2)
Total Protein: 6.2 g/dL (ref 6.0–8.3)

## 2020-04-06 LAB — PSA: PSA: 2.53 ng/mL (ref 0.10–4.00)

## 2020-04-06 LAB — HEMOGLOBIN A1C: Hgb A1c MFr Bld: 6.5 % (ref 4.6–6.5)
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
## 2020-04-06 NOTE — Progress Notes (Signed)
PCP notes:  Health Maintenance: Colonoscopy- due, will complete FOBT Shingrix- due, patient agrees to receive this   Abnormal Screenings: none   Patient concerns: Discuss insulin resistant alternatives   Nurse concerns: none   Next PCP appt.: 04/10/2020 @ 8:30 am

## 2020-04-06 NOTE — Patient Instructions (Signed)
[name] , Thank you for taking time to come for your Medicare Wellness Visit. I appreciate your ongoing commitment to your health goals. Please review the following plan we discussed and let me know if I can assist you in the future.   Screening recommendations/referrals: Colonoscopy: due, will complete FOBT Recommended yearly ophthalmology/optometry visit for glaucoma screening and checkup Recommended yearly dental visit for hygiene and checkup  Vaccinations: Influenza vaccine: Up to date, completed 10/10/2019 Pneumococcal vaccine: Completed series Tdap vaccine: Up to date, completed 04/17/2014 Shingles vaccine: discussed    Advanced directives: Please bring a copy of your POA (Power of [place]) and/or Living Will to your next appointment.   Conditions/risks identified: diabetes, hypertension, hyperlipidemia  Next appointment: 04/10/2020 @ 8:30 am   Preventive Care 68 Years and Older, Male Preventive care refers to lifestyle choices and visits with your health care provider that can promote health and wellness. What does preventive care include?  A yearly physical exam. This is also called an annual well check.  Dental exams once or twice a year.  Routine eye exams. Ask your health care provider how often you should have your eyes checked.  Personal lifestyle choices, including:  Daily care of your teeth and gums.  Regular physical activity.  Eating a healthy diet.  Avoiding tobacco and drug use.  Limiting alcohol use.  Practicing safe sex.  Taking low doses of aspirin every day.  Taking vitamin and mineral supplements as recommended by your health care provider. What happens during an annual well check? The services and screenings done by your health care provider during your annual well check will depend on your age, overall health, lifestyle risk factors, and family history of disease. Counseling  Your health care provider may ask you questions about  your:  Alcohol use.  Tobacco use.  Drug use.  Emotional well-being.  Home and relationship well-being.  Sexual activity.  Eating habits.  History of falls.  Memory and ability to understand (cognition).  Work and work Astronomer. Screening  You may have the following tests or measurements:  Height, weight, and BMI.  Blood pressure.  Lipid and cholesterol levels. These may be checked every 5 years, or more frequently if you are over 41 years old.  Skin check.  Lung cancer screening. You may have this screening every year starting at age 81 if you have a 30-pack-year history of smoking and currently smoke or have quit within the past 15 years.  Fecal occult blood test (FOBT) of the stool. You may have this test every year starting at age 79.  Flexible sigmoidoscopy or colonoscopy. You may have a sigmoidoscopy every 5 years or a colonoscopy every 10 years starting at age 59.  Prostate cancer screening. Recommendations will vary depending on your family history and other risks.  Hepatitis C blood test.  Hepatitis B blood test.  Sexually transmitted disease (STD) testing.  Diabetes screening. This is done by checking your blood sugar (glucose) after you have not eaten for a while (fasting). You may have this done every 1-3 years.  Abdominal aortic aneurysm (AAA) screening. You may need this if you are a current or former smoker.  Osteoporosis. You may be screened starting at age 49 if you are at high risk. Talk with your health care provider about your test results, treatment options, and if necessary, the need for more tests. Vaccines  Your health care provider may recommend certain vaccines, such as:  Influenza vaccine. This is recommended every year.  Tetanus, diphtheria, and acellular pertussis (Tdap, Td) vaccine. You may need a Td booster every 10 years.  Zoster vaccine. You may need this after age 3.  Pneumococcal 13-valent conjugate (PCV13) vaccine.  One dose is recommended after age 82.  Pneumococcal polysaccharide (PPSV23) vaccine. One dose is recommended after age 60. Talk to your health care provider about which screenings and vaccines you need and how often you need them. This information is not intended to replace advice given to you by your health care provider. Make sure you discuss any questions you have with your health care provider. Document Released: 12/18/2015 Document Revised: 08/10/2016 Document Reviewed: 09/22/2015 Elsevier Interactive Patient Education  2017 [place] Prevention in the Home Falls can cause injuries. They can happen to people of all ages. There are many things you can do to make your home safe and to help prevent falls. What can I do on the outside of my home?  Regularly fix the edges of walkways and driveways and fix any cracks.  Remove anything that might make you trip as you walk through a door, such as a raised step or threshold.  Trim any bushes or trees on the path to your home.  Use bright outdoor lighting.  Clear any walking paths of anything that might make someone trip, such as rocks or tools.  Regularly check to see if handrails are loose or broken. Make sure that both sides of any steps have handrails.  Any raised decks and porches should have guardrails on the edges.  Have any leaves, snow, or ice cleared regularly.  Use sand or salt on walking paths during winter.  Clean up any spills in your garage right away. This includes oil or grease spills. What can I do in the bathroom?  Use night lights.  Install grab bars by the toilet and in the tub and shower. Do not use towel bars as grab bars.  Use non-skid mats or decals in the tub or shower.  If you need to sit down in the shower, use a plastic, non-slip stool.  Keep the floor dry. Clean up any water that spills on the floor as soon as it happens.  Remove soap buildup in the tub or shower regularly.  Attach bath  mats securely with double-sided non-slip rug tape.  Do not have throw rugs and other things on the floor that can make you trip. What can I do in the bedroom?  Use night lights.  Make sure that you have a light by your bed that is easy to reach.  Do not use any sheets or blankets that are too big for your bed. They should not hang down onto the floor.  Have a firm chair that has side arms. You can use this for support while you get dressed.  Do not have throw rugs and other things on the floor that can make you trip. What can I do in the kitchen?  Clean up any spills right away.  Avoid walking on wet floors.  Keep items that you use a lot in easy-to-reach places.  If you need to reach something above you, use a strong step stool that has a grab bar.  Keep electrical cords out of the way.  Do not use floor polish or wax that makes floors slippery. If you must use wax, use non-skid floor wax.  Do not have throw rugs and other things on the floor that can make you trip. What can I do  with my stairs?  Do not leave any items on the stairs.  Make sure that there are handrails on both sides of the stairs and use them. Fix handrails that are broken or loose. Make sure that handrails are as long as the stairways.  Check any carpeting to make sure that it is firmly attached to the stairs. Fix any carpet that is loose or worn.  Avoid having throw rugs at the top or bottom of the stairs. If you do have throw rugs, attach them to the floor with carpet tape.  Make sure that you have a light switch at the top of the stairs and the bottom of the stairs. If you do not have them, ask someone to add them for you. What else can I do to help prevent falls?  Wear shoes that:  Do not have high heels.  Have rubber bottoms.  Are comfortable and fit you well.  Are closed at the toe. Do not wear sandals.  If you use a stepladder:  Make sure that it is fully opened. Do not climb a closed  stepladder.  Make sure that both sides of the stepladder are locked into place.  Ask someone to hold it for you, if possible.  Clearly mark and make sure that you can see:  Any grab bars or handrails.  First and last steps.  Where the edge of each step is.  Use tools that help you move around (mobility aids) if they are needed. These include:  Canes.  Walkers.  Scooters.  Crutches.  Turn on the lights when you go into a dark area. Replace any light bulbs as soon as they burn out.  Set up your furniture so you have a clear path. Avoid moving your furniture around.  If any of your floors are uneven, fix them.  If there are any pets around you, be aware of where they are.  Review your medicines with your doctor. Some medicines can make you feel dizzy. This can increase your chance of falling. Ask your doctor what other things that you can do to help prevent falls. This information is not intended to replace advice given to you by your health care provider. Make sure you discuss any questions you have with your health care provider. Document Released: 09/17/2009 Document Revised: 04/28/2016 Document Reviewed: 12/26/2014 Elsevier Interactive Patient Education  2017 [employer].

## 2020-04-09 ENCOUNTER — Other Ambulatory Visit (INDEPENDENT_AMBULATORY_CARE_PROVIDER_SITE_OTHER): Payer: PRIVATE HEALTH INSURANCE

## 2020-04-09 DIAGNOSIS — Z1211 Encounter for screening for malignant neoplasm of colon: Secondary | ICD-10-CM

## 2020-04-09 LAB — FECAL OCCULT BLOOD, IMMUNOCHEMICAL: Fecal Occult Bld: NEGATIVE

## 2020-04-09 LAB — FECAL OCCULT BLOOD, GUAIAC: Fecal Occult Blood: NEGATIVE

## 2020-04-10 ENCOUNTER — Other Ambulatory Visit: Payer: Self-pay

## 2020-04-10 ENCOUNTER — Encounter: Payer: Self-pay | Admitting: Family Medicine

## 2020-04-10 ENCOUNTER — Ambulatory Visit (INDEPENDENT_AMBULATORY_CARE_PROVIDER_SITE_OTHER): Payer: Medicare Other | Admitting: Family Medicine

## 2020-04-10 VITALS — BP 146/78 | HR 63 | Temp 98.2°F | Ht 69.5 in | Wt 215.2 lb

## 2020-04-10 DIAGNOSIS — I480 Paroxysmal atrial fibrillation: Secondary | ICD-10-CM

## 2020-04-10 DIAGNOSIS — E1169 Type 2 diabetes mellitus with other specified complication: Secondary | ICD-10-CM

## 2020-04-10 DIAGNOSIS — Z7189 Other specified counseling: Secondary | ICD-10-CM | POA: Insufficient documentation

## 2020-04-10 DIAGNOSIS — I10 Essential (primary) hypertension: Secondary | ICD-10-CM

## 2020-04-10 DIAGNOSIS — R972 Elevated prostate specific antigen [PSA]: Secondary | ICD-10-CM

## 2020-04-10 DIAGNOSIS — E118 Type 2 diabetes mellitus with unspecified complications: Secondary | ICD-10-CM

## 2020-04-10 DIAGNOSIS — K760 Fatty (change of) liver, not elsewhere classified: Secondary | ICD-10-CM

## 2020-04-10 DIAGNOSIS — E785 Hyperlipidemia, unspecified: Secondary | ICD-10-CM

## 2020-04-10 DIAGNOSIS — E669 Obesity, unspecified: Secondary | ICD-10-CM

## 2020-04-10 DIAGNOSIS — F331 Major depressive disorder, recurrent, moderate: Secondary | ICD-10-CM

## 2020-04-10 NOTE — Assessment & Plan Note (Signed)
Chronic. Elevated today. However home readings are all normal, some too low. I asked him to check home BP cuff with manual sphygmomanometer he has at home and notify us if persistently elevated to titrate meds accordingly.

## 2020-04-10 NOTE — Assessment & Plan Note (Signed)
LFTs remain normal.

## 2020-04-10 NOTE — Assessment & Plan Note (Signed)
Sounds regular today. Continue eliquis, amiodarone, diltiazem and toprol XL.

## 2020-04-10 NOTE — Patient Instructions (Addendum)
If interested, check with pharmacy about new 2 shot shingles series (shingrix).  Bring [place] advanced directive copy to update your chart. Good to see you today Return as needed or in 6 months for diabetes check.  Work on aerobic exercise  Health Maintenance After Age 68 After age 53, you are at a higher risk for certain long-term diseases and infections as well as injuries from falls. Falls are a major cause of broken bones and head injuries in people who are older than age 31. Getting regular preventive care can help to keep you healthy and well. Preventive care includes getting regular testing and making lifestyle changes as recommended by your health care provider. Talk with your health care provider about:  Which screenings and tests you should have. A screening is a test that checks for a disease when you have no symptoms.  A diet and exercise plan that is right for you. What should I know about screenings and tests to prevent falls? Screening and testing are the best ways to find a health problem early. Early diagnosis and treatment give you the best chance of managing medical conditions that are common after age 72. Certain conditions and lifestyle choices may make you more likely to have a fall. Your health care provider may recommend:  Regular vision checks. Poor vision and conditions such as cataracts can make you more likely to have a fall. If you wear glasses, make sure to get your prescription updated if your vision changes.  Medicine review. Work with your health care provider to regularly review all of the medicines you are taking, including over-the-counter medicines. Ask your health care provider about any side effects that may make you more likely to have a fall. Tell your health care provider if any medicines that you take make you feel dizzy or sleepy.  Osteoporosis screening. Osteoporosis is a condition that causes the bones to get weaker. This can make the bones weak and cause  them to break more easily.  Blood pressure screening. Blood pressure changes and medicines to control blood pressure can make you feel dizzy.  Strength and balance checks. Your health care provider may recommend certain tests to check your strength and balance while standing, walking, or changing positions.  Foot health exam. Foot pain and numbness, as well as not wearing proper footwear, can make you more likely to have a fall.  Depression screening. You may be more likely to have a fall if you have a fear of falling, feel emotionally low, or feel unable to do activities that you used to do.  Alcohol use screening. Using too much alcohol can affect your balance and may make you more likely to have a fall. What actions can I take to lower my risk of falls? General instructions  Talk with your health care provider about your risks for falling. Tell your health care provider if: ? You fall. Be sure to tell your health care provider about all falls, even ones that seem minor. ? You feel dizzy, sleepy, or off-balance.  Take over-the-counter and prescription medicines only as told by your health care provider. These include any supplements.  Eat a healthy diet and maintain a healthy weight. A healthy diet includes low-fat dairy products, low-fat (lean) meats, and fiber from whole grains, beans, and lots of fruits and vegetables. Home safety  Remove any tripping hazards, such as rugs, cords, and clutter.  Install safety equipment such as grab bars in bathrooms and safety rails on stairs.  Keep rooms and walkways well-lit. Activity   Follow a regular exercise program to stay fit. This will help you maintain your balance. Ask your health care provider what types of exercise are appropriate for you.  If you need a cane or walker, use it as recommended by your health care provider.  Wear supportive shoes that have nonskid soles. Lifestyle  Do not drink alcohol if your health care provider  tells you not to drink.  If you drink alcohol, limit how much you have: ? 0-1 drink a day for women. ? 0-2 drinks a day for men.  Be aware of how much alcohol is in your drink. In the [place] one drink equals one typical bottle of beer (12 oz), one-half glass of wine (5 oz), or one shot of hard liquor (1 oz).  Do not use any products that contain nicotine or tobacco, such as cigarettes and e-cigarettes. If you need help quitting, ask your health care provider. Summary  Having a healthy lifestyle and getting preventive care can help to protect your health and wellness after age 89.  Screening and testing are the best way to find a health problem early and help you avoid having a fall. Early diagnosis and treatment give you the best chance for managing medical conditions that are more common for people who are older than age 73.  Falls are a major cause of broken bones and head injuries in people who are older than age 73. Take precautions to prevent a fall at home.  Work with your health care provider to learn what changes you can make to improve your health and wellness and to prevent falls. This information is not intended to replace advice given to you by your health care provider. Make sure you discuss any questions you have with your health care provider. Document Revised: 03/14/2019 Document Reviewed: 10/04/2017 Elsevier Patient Education  2020 [employer].

## 2020-04-10 NOTE — Assessment & Plan Note (Signed)
Chronic, great control on statin with LDL <70. Continue this. The ASCVD Risk score Jonathon George DC Jr., et al., 2013) failed to calculate for the following reasons:   The valid total cholesterol range is 130 to 320 mg/dL

## 2020-04-10 NOTE — Assessment & Plan Note (Signed)
Isolated elevation 2017, with subsequent normal readings. Will continue to monitor yearly.

## 2020-04-10 NOTE — Assessment & Plan Note (Signed)
Encouraged healthy diet and lifestyle changes to affect sustainable weight loss.  

## 2020-04-10 NOTE — Assessment & Plan Note (Signed)
Advanced directive discussion: has living will at home, does want CPR but doesn't want prolonged life support if terminal condition. Would want son Jonathon Middleton to be HCPOA. Will bring us copy.  

## 2020-04-10 NOTE — Progress Notes (Signed)
This visit was conducted in person.  BP (!) 146/78 (BP Location: Right Arm, Patient Position: Sitting, Cuff Size: Large)   Pulse 63   Temp 98.2 F (36.8 C) (Temporal)   Ht 5' 9.5" (1.765 m)   Wt 215 lb 4 oz (97.6 kg)   SpO2 97%   BMI 31.33 kg/m   Elevated on repeat  CC: CPE Subjective:    Patient ID: Jonathon Middleton, male    DOB: [birth year], 69 y.o.   MRN: [id number]  HPI: Jonathon Middleton is a 69 y.o. male presenting on 04/10/2020 for Annual Exam (Prt 2. )   Saw health advisor last week for medicare wellness visit. Note reviewed.    No exam data present    Clinical Support from 04/06/2020 in [facility] Total Score  2      Fall Risk  04/06/2020  Falls in the past year? 1  Comment fell off bike  Number falls in past yr: 0  Injury with Fall? 0  Risk for fall due to : Medication side effect  Follow up Falls evaluation completed;Falls prevention discussed   Home BP well controlled actually some low SBPs to 90s.   Preventative: Colonoscopy - 2008 ([place]) WNL, rpt 10 yrs. iFOB negative today Prostate cancer screening - desires to continue screening regularly. Nocturia x1. Strong stream.  Flu shot yearly Pneumovax 10/2013, prevnar 10/2019 COVID vaccine - Moderna completed 03/2020  Tdap - 04/2014 zostavax 10/2013 shingrix - discussed.  Advanced directive discussion: has living will at home, does want CPR but doesn't want prolonged life support if terminal condition. Would want son [name] to be HCPOA. Will bring [place] copy.  Seat belt use discussed  Sunscreen use discussed, no changing moles on skin.  Ex smoker remotely - rare cigar, nothing recently Alcohol - rare  Dentist - q6 mo Eye exam - several recently - overall good report  Bowel - no constipation Bladder - no incontinence  Lives alone.  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 Interim medical history since our last visit reviewed. Allergies and medications reviewed and updated. Outpatient Medications Prior to Visit  Medication Sig Dispense Refill  . amiodarone (PACERONE) 200 MG tablet TAKE 1 TABLET BY MOUTH EVERY DAY 90 tablet 1  . apixaban (ELIQUIS) 5 MG TABS tablet Take 1 tablet (5 mg total) by mouth 2 (two) times daily. 180 tablet 1  . atorvastatin (LIPITOR) 40 MG tablet TAKE 1 TABLET BY MOUTH EVERYDAY AT BEDTIME 90 tablet 3  . Cholecalciferol (VITAMIN D) 2000 units CAPS Take 2,000 Units by mouth 2 (two) times daily.     . Coenzyme Q10 (CO Q-10) 100 MG CAPS Take 100 mg by mouth daily with breakfast.     . diltiazem (CARDIZEM CD) 120 MG 24 hr capsule TAKE 1 CAPSULE BY MOUTH EVERY DAY 90 capsule 3  . glucose blood (ONE TOUCH ULTRA TEST) test strip Use to check sugar once daily and as needed. Dx: E11.9 100 each 3  . Lancets (ONETOUCH ULTRASOFT) lancets USE AS INSTRUCTED 100 each 0  . losartan (COZAAR) 50 MG tablet Take 1 tablet (50 mg total) by mouth daily. 90 tablet 1  . Melatonin  10 MG TABS Take 10 mg by mouth at bedtime.     . metoprolol succinate (TOPROL-XL) 50 MG 24 hr tablet TAKE 1 TABLET BY MOUTH EVERY DAY 90 tablet 3  . Multiple Vitamin (MULTIVITAMIN WITH MINERALS) TABS tablet Take 1 tablet by mouth daily with breakfast.    . omega-3 fish oil (MAXEPA) 1000 MG CAPS capsule Take 1 capsule by mouth 2 (two) times daily.     . sertraline (ZOLOFT) 100 MG tablet Take 1 tablet (100 mg total) by mouth daily. 90 tablet 3  . TURMERIC PO Take by mouth in the morning and at bedtime.    . liraglutide (VICTOZA) 18 MG/3ML SOPN Inject 0.1 mLs (0.6 mg total) into the skin daily for 7 days, THEN 0.2 mLs (1.2 mg total) daily for 7 days, THEN 0.3 mLs (1.8 mg total)  daily. 3 pen 1   No facility-administered medications prior to visit.     Per HPI unless specifically indicated in ROS section below Review of Systems Objective:  BP (!) 146/78 (BP Location: Right Arm, Patient Position: Sitting, Cuff Size: Large)   Pulse 63   Temp 98.2 F (36.8 C) (Temporal)   Ht 5' 9.5" (1.765 m)   Wt 215 lb 4 oz (97.6 kg)   SpO2 97%   BMI 31.33 kg/m   Wt Readings from Last 3 Encounters:  04/10/20 215 lb 4 oz (97.6 kg)  04/06/20 209 lb 8 oz (95 kg)  11/11/19 220 lb 8 oz (100 kg)      Physical Exam Vitals and nursing note reviewed.  Constitutional:      General: He is not in acute distress.    Appearance: Normal appearance. He is well-developed. He is not ill-appearing.  HENT:     Head: Normocephalic and atraumatic.     Right Ear: Hearing, tympanic membrane, ear canal and external ear normal.     Left Ear: Hearing, tympanic membrane, ear canal and external ear normal.  Eyes:     General: No scleral icterus.    Extraocular Movements: Extraocular movements intact.     Conjunctiva/sclera: Conjunctivae normal.     Pupils: Pupils are equal, round, and reactive to light.  Neck:     Thyroid: No thyromegaly or thyroid tenderness.     Vascular: No carotid bruit.  Cardiovascular:     Rate and Rhythm: Normal rate and regular rhythm.     Pulses: Normal pulses.          Radial pulses are 2+ on the right side and 2+ on the left side.     Heart sounds: Normal heart sounds. No murmur.  Pulmonary:     Effort: Pulmonary effort is normal. No respiratory distress.     Breath sounds: Normal breath sounds. No wheezing, rhonchi or rales.  Abdominal:     General: Abdomen is flat. Bowel sounds are normal. There is no distension.     Palpations: Abdomen is soft. There is no mass.     Tenderness: There is no abdominal tenderness. There is no guarding or rebound.     Hernia: No hernia is present.  Musculoskeletal:        General: Normal range of motion.     Cervical back:  Normal range of motion and neck supple.     Right lower leg: No edema.     Left lower leg: No edema.  Lymphadenopathy:     Cervical: No cervical adenopathy.  Skin:    General: Skin is warm and dry.  Findings: No rash.  Neurological:     General: No focal deficit present.     Mental Status: He is alert and oriented to person, place, and time.     Comments: CN grossly intact, station and gait intact  Psychiatric:        Mood and Affect: Mood normal.        Behavior: Behavior normal.        Thought Content: Thought content normal.        Judgment: Judgment normal.       Results for orders placed or performed in visit on 04/10/20  Fecal Occult Blood, Guaiac  Result Value Ref Range   Fecal Occult Blood Negative    Assessment & Plan:  This visit occurred during the SARS-CoV-2 public health emergency.  Safety protocols were in place, including screening questions prior to the visit, additional usage of staff PPE, and extensive cleaning of exam room while observing appropriate contact time as indicated for disinfecting solutions.   Problem List Items Addressed This Visit    Paroxysmal atrial fibrillation (HCC)    Sounds regular today. Continue eliquis, amiodarone, diltiazem and toprol XL.       Obesity, Class I, BMI 30-34.9    Encouraged healthy diet and lifestyle changes to affect sustainable weight loss.       Nonalcoholic fatty liver disease    LFTs remain normal.       MDD (major depressive disorder), recurrent episode (HCC)    Continue sertraline 100mg  daily.       Hyperlipidemia associated with type 2 diabetes mellitus (HCC)    Chronic, great control on statin with LDL <70. Continue this. The ASCVD Risk score DC Jr., et al., 2013) failed to calculate for the following reasons:   The valid total cholesterol range is 130 to 320 mg/dL       Essential hypertension    Chronic. Elevated today. However home readings are all normal, some too low. I asked him to  check home BP cuff with manual sphygmomanometer he has at home and notify 2014 if persistently elevated to titrate meds accordingly.       Elevated PSA    Isolated elevation 2017, with subsequent normal readings. Will continue to monitor yearly.      Controlled diabetes mellitus type 2 with complications (HCC)    Chronic, diet controlled. Reviewed recent readings.       Advanced directives, counseling/discussion - Primary    Advanced directive discussion: has living will at home, does want CPR but doesn't want prolonged life support if terminal condition. Would want son [place] to be HCPOA. Will bring [name] copy.           No orders of the defined types were placed in this encounter.  No orders of the defined types were placed in this encounter.   Patient instructions: If interested, check with pharmacy about new 2 shot shingles series (shingrix).  Bring [place] advanced directive copy to update your chart. Good to see you today Return as needed or in 6 months for diabetes check.  Work on aerobic exercise  Follow up plan: Return in about 6 months (around 10/11/2020) for follow up visit.  13/06/2020, MD

## 2020-04-10 NOTE — Assessment & Plan Note (Signed)
Continue sertraline.  100 mg daily  

## 2020-04-10 NOTE — Assessment & Plan Note (Signed)
Chronic, diet controlled. Reviewed recent readings.

## 2020-04-16 ENCOUNTER — Ambulatory Visit (INDEPENDENT_AMBULATORY_CARE_PROVIDER_SITE_OTHER): Payer: Self-pay | Admitting: Psychology

## 2020-04-16 DIAGNOSIS — F4323 Adjustment disorder with mixed anxiety and depressed mood: Secondary | ICD-10-CM

## 2020-04-30 ENCOUNTER — Ambulatory Visit (INDEPENDENT_AMBULATORY_CARE_PROVIDER_SITE_OTHER): Payer: Self-pay | Admitting: Psychology

## 2020-04-30 DIAGNOSIS — F4323 Adjustment disorder with mixed anxiety and depressed mood: Secondary | ICD-10-CM

## 2020-05-14 ENCOUNTER — Ambulatory Visit (INDEPENDENT_AMBULATORY_CARE_PROVIDER_SITE_OTHER): Payer: Self-pay | Admitting: Psychology

## 2020-05-14 DIAGNOSIS — F4323 Adjustment disorder with mixed anxiety and depressed mood: Secondary | ICD-10-CM

## 2020-05-28 ENCOUNTER — Ambulatory Visit (INDEPENDENT_AMBULATORY_CARE_PROVIDER_SITE_OTHER): Payer: Self-pay | Admitting: Psychology

## 2020-05-28 DIAGNOSIS — F4323 Adjustment disorder with mixed anxiety and depressed mood: Secondary | ICD-10-CM

## 2020-06-11 ENCOUNTER — Ambulatory Visit (INDEPENDENT_AMBULATORY_CARE_PROVIDER_SITE_OTHER): Payer: Self-pay | Admitting: Psychology

## 2020-06-11 DIAGNOSIS — F4323 Adjustment disorder with mixed anxiety and depressed mood: Secondary | ICD-10-CM

## 2020-06-25 ENCOUNTER — Ambulatory Visit (INDEPENDENT_AMBULATORY_CARE_PROVIDER_SITE_OTHER): Payer: Self-pay | Admitting: Psychology

## 2020-06-25 DIAGNOSIS — F4323 Adjustment disorder with mixed anxiety and depressed mood: Secondary | ICD-10-CM

## 2020-07-09 ENCOUNTER — Other Ambulatory Visit: Payer: Self-pay | Admitting: Family Medicine

## 2020-07-09 ENCOUNTER — Ambulatory Visit (INDEPENDENT_AMBULATORY_CARE_PROVIDER_SITE_OTHER): Payer: Self-pay | Admitting: Psychology

## 2020-07-09 DIAGNOSIS — F4323 Adjustment disorder with mixed anxiety and depressed mood: Secondary | ICD-10-CM

## 2020-07-09 NOTE — Telephone Encounter (Signed)
Amiodarone Last filled:  04/15/20, #90 Last OV:  04/10/20, AWV prt 2 Next OV:  10/12/20, 6 mo f/u

## 2020-07-23 ENCOUNTER — Ambulatory Visit: Payer: Self-pay | Admitting: Psychology

## 2020-07-27 NOTE — Progress Notes (Signed)
Cardiology Office Note  Date:  07/28/2020   ID:  Jonathon Middleton, DOB 11/01/1951, MRN [id number]  PCP:  Jonathon Boyden, MD   Chief Complaint  Patient presents with  . other    12 month follow up. Meds reviewed by the pt. verbally. "doing well."     HPI:  Jonathon Middleton is a 69 year old gentleman with  smoking history for 5 years who stopped in 1984, PAF, 2014, 2017 , 2019, Chads vasc score of 1.  who presents for follow-up of his paroxysmal atrial fibrillation.  In follow-up today reports he is doing well Reports he is watching his diet, Doing lots of outdoor activities, periodic treadmill Denies any significant breakthrough tachycardia concerning for atrial fibrillation On amiodarone Pressure at home before meds: 125-140/70-80  Tolerating Lipitor   Lab Results  Component Value Date   CHOL 128 04/06/2020   HDL 39.10 04/06/2020   LDLCALC 70 04/06/2020   TRIG 95.0 04/06/2020     EKG personally reviewed by myself on todays visit Shows normal sinus rhythm with rate 60 bpm no significant ST or T wave changes   Other past medical history reviewed Sept 2018 fell off ladder Broke humerus on the right rebroke his arm, "overdoing it"  surgery again   father had heart attack at age 38 and died. He was a long-time smoker. Mother lived to 34  PMH:   has a past medical history of Arthritis, Atrial fibrillation (HCC), Depression, Depression with anxiety, GERD (gastroesophageal reflux disease), History of chicken pox, Humerus fracture, Hyperlipidemia, Hypertension, Nonalcoholic fatty liver disease, Pre-diabetes, and Prediabetes.  PSH:    Past Surgical History:  Procedure Laterality Date  . COLONOSCOPY    . HARDWARE REMOVAL Right 09/21/2017   Procedure: HARDWARE REMOVAL OF RIGHT HUMERUS;  Surgeon: Tarry Kos, MD;  Location: [facility] OR;  Service: Orthopedics;  Laterality: Right;  . KNEE SURGERY Right 2009   x 2 (Dr. Ernest Pine)  Arthroscopy- torn MCL  . ORIF HUMERUS FRACTURE Right  08/25/2017   Procedure: OPEN REDUCTION INTERNAL FIXATION (ORIF) RIGHT HUMERAL SHAFT FRACTURE;  Surgeon: Tarry Kos, MD;  Location: [facility] OR;  Service: Orthopedics;  Laterality: Right;  . ORIF HUMERUS FRACTURE Right 09/21/2017   Procedure: OPEN REDUCTION INTERNAL FIXATION (ORIF) RIGHT HUMERAL SHAFT FRACTURE;  Surgeon: Tarry Kos, MD;  Location: [facility] OR;  Service: Orthopedics;  Laterality: Right;    Current Outpatient Medications  Medication Sig Dispense Refill  . amiodarone (PACERONE) 200 MG tablet TAKE 1 TABLET BY MOUTH EVERY DAY 90 tablet 1  . apixaban (ELIQUIS) 5 MG TABS tablet Take 1 tablet (5 mg total) by mouth 2 (two) times daily. 180 tablet 1  . atorvastatin (LIPITOR) 40 MG tablet TAKE 1 TABLET BY MOUTH EVERYDAY AT BEDTIME 90 tablet 3  . Cholecalciferol (VITAMIN D) 2000 units CAPS Take 2,000 Units by mouth 2 (two) times daily.     . Coenzyme Q10 (CO Q-10) 100 MG CAPS Take 100 mg by mouth daily with breakfast.     . diltiazem (CARDIZEM CD) 120 MG 24 hr capsule TAKE 1 CAPSULE BY MOUTH EVERY DAY 90 capsule 3  . glucose blood (ONE TOUCH ULTRA TEST) test strip Use to check sugar once daily and as needed. Dx: E11.9 100 each 3  . Lancets (ONETOUCH ULTRASOFT) lancets USE AS INSTRUCTED 100 each 0  . losartan (COZAAR) 50 MG tablet Take 1 tablet (50 mg total) by mouth daily. 90 tablet 1  . metoprolol succinate (TOPROL-XL) 50 MG 24 hr tablet TAKE  1 TABLET BY MOUTH EVERY DAY 90 tablet 3  . Multiple Vitamin (MULTIVITAMIN WITH MINERALS) TABS tablet Take 1 tablet by mouth daily with breakfast.    . omega-3 fish oil (MAXEPA) 1000 MG CAPS capsule Take 1 capsule by mouth 2 (two) times daily.     . sertraline (ZOLOFT) 100 MG tablet Take 1 tablet (100 mg total) by mouth daily. 90 tablet 3  . TURMERIC PO Take by mouth in the morning and at bedtime.     No current facility-administered medications for this visit.    Allergies:   Patient has no known allergies.   Social History:  The patient  reports that  he has quit smoking. His smoking use included cigars. He has a 5.00 pack-year smoking history. He has never used smokeless tobacco. He reports current alcohol use of about 2.0 standard drinks of alcohol per week. He reports that he does not use drugs.   Family History:   family history includes CAD (age of onset: 43) in his father; CAD (age of onset: 26) in his paternal uncle; Cancer in his maternal uncle; Diabetes in his paternal grandmother; Hypertension in his mother; Stroke in his paternal aunt.    Review of Systems: Review of Systems  Constitutional: Negative.   HENT: Negative.   Respiratory: Negative.   Cardiovascular: Negative.   Gastrointestinal: Negative.   Musculoskeletal: Negative.   Neurological: Negative.   Psychiatric/Behavioral: Negative.   All other systems reviewed and are negative.   PHYSICAL EXAM: VS:  BP 140/86 (BP Location: Left Arm, Patient Position: Sitting, Cuff Size: Normal)   Pulse 60   Ht 5\' 10"  (1.778 m)   Wt 219 lb (99.3 kg)   SpO2 98%   BMI 31.42 kg/m  , BMI Body mass index is 31.42 kg/m. Constitutional:  oriented to person, place, and time. No distress.  HENT:  Head: Grossly normal Eyes:  no discharge. No scleral icterus.  Neck: No JVD, no carotid bruits  Cardiovascular: Regular rate and rhythm, no murmurs appreciated Pulmonary/Chest: Clear to auscultation bilaterally, no wheezes or rails Abdominal: Soft.  no distension.  no tenderness.  Musculoskeletal: Normal range of motion Neurological:  normal muscle tone. Coordination normal. No atrophy Skin: Skin warm and dry Psychiatric: normal affect, pleasant   Recent Labs: 04/06/2020: ALT 30; BUN 20; Creatinine, Ser 1.12; Hemoglobin 14.1; Platelets 185.0; Potassium 4.1; Sodium 141    Lipid Panel Lab Results  Component Value Date   CHOL 128 04/06/2020   HDL 39.10 04/06/2020   LDLCALC 70 04/06/2020   TRIG 95.0 04/06/2020      Wt Readings from Last 3 Encounters:  07/28/20 219 lb (99.3 kg)   04/10/20 215 lb 4 oz (97.6 kg)  04/06/20 209 lb 8 oz (95 kg)     ASSESSMENT AND PLAN:  Paroxysmal atrial fibrillation (HCC) - Plan: EKG 12-Lead No significant arrhythmia noted We will continue current medications, Tolerating anticoagulation  Hyperlipidemia On Lipitor, numbers at goal, discussed  Essential hypertension Blood pressure relatively well controlled at home, no medication changes made  Smoking history Stopped smoking many years ago  Controlled type 2 diabetes mellitus without complication, without long-term current use of insulin (HCC) He is changing his diet, Recommend he continue walking, treadmill, other outdoor activities  Preventive care We have ordered CT coronary calcium scoring given family history   Total encounter time more than 25 minutes  Greater than 50% was spent in counseling and coordination of care with the patient   Disposition:   F/U  12 months   No orders of the defined types were placed in this encounter.    Signed, Dossie Arbour, M.D., Ph.D. 07/28/2020  [facility] [place], [place] [phone]

## 2020-07-28 ENCOUNTER — Encounter: Payer: Self-pay | Admitting: Cardiovascular Disease

## 2020-07-28 ENCOUNTER — Other Ambulatory Visit: Payer: Self-pay

## 2020-07-28 ENCOUNTER — Ambulatory Visit (INDEPENDENT_AMBULATORY_CARE_PROVIDER_SITE_OTHER): Payer: Self-pay | Admitting: Cardiovascular Disease

## 2020-07-28 VITALS — BP 140/86 | HR 60 | Ht 70.0 in | Wt 219.0 lb

## 2020-07-28 DIAGNOSIS — Z87891 Personal history of nicotine dependence: Secondary | ICD-10-CM

## 2020-07-28 DIAGNOSIS — I480 Paroxysmal atrial fibrillation: Secondary | ICD-10-CM

## 2020-07-28 DIAGNOSIS — E119 Type 2 diabetes mellitus without complications: Secondary | ICD-10-CM

## 2020-07-28 DIAGNOSIS — E782 Mixed hyperlipidemia: Secondary | ICD-10-CM

## 2020-07-28 NOTE — Patient Instructions (Addendum)
CT coronary calcium score Family hx.  We will order CT coronary calcium score $154.42 at our [facility] in [place]   Please call [phone] to schedule [facility] [address] [place], [place] [address]   Medication Instructions:  No changes  If you need a refill on your cardiac medications before your next appointment, please call your pharmacy.    Lab work: No new labs needed   If you have labs (blood work) drawn today and your tests are completely normal, you will receive your results only by: Marland Kitchen MyChart Message (if you have MyChart) OR . A paper copy in the mail If you have any lab test that is abnormal or we need to change your treatment, we will call you to review the results.   Testing/Procedures: No new testing needed   Follow-Up: At [facility], you and your health needs are our priority.  As part of our continuing mission to provide you with exceptional heart care, we have created designated Provider Care Teams.  These Care Teams include your primary Cardiologist (physician) and Advanced Practice Providers (APPs -  Physician Assistants and Nurse Practitioners) who all work together to provide you with the care you need, when you need it.  . You will need a follow up appointment in 12 months  . Providers on your designated Care Team:   . Nicolasa Ducking, NP . Eula Listen, PA-C . Marisue Ivan, PA-C  Any Other Special Instructions Will Be Listed Below (If Applicable).  COVID-19 Vaccine Information can be found at: [email or web address] For questions related to vaccine distribution or appointments, please email [email or web address] or call [phone].

## 2020-08-06 ENCOUNTER — Ambulatory Visit (INDEPENDENT_AMBULATORY_CARE_PROVIDER_SITE_OTHER): Payer: Self-pay | Admitting: Psychology

## 2020-08-06 DIAGNOSIS — F4323 Adjustment disorder with mixed anxiety and depressed mood: Secondary | ICD-10-CM

## 2020-08-20 ENCOUNTER — Ambulatory Visit (INDEPENDENT_AMBULATORY_CARE_PROVIDER_SITE_OTHER): Payer: Self-pay | Admitting: Psychology

## 2020-08-20 DIAGNOSIS — F4323 Adjustment disorder with mixed anxiety and depressed mood: Secondary | ICD-10-CM

## 2020-09-03 ENCOUNTER — Ambulatory Visit (INDEPENDENT_AMBULATORY_CARE_PROVIDER_SITE_OTHER): Payer: Self-pay | Admitting: Psychology

## 2020-09-03 DIAGNOSIS — F4323 Adjustment disorder with mixed anxiety and depressed mood: Secondary | ICD-10-CM

## 2020-09-17 ENCOUNTER — Ambulatory Visit (INDEPENDENT_AMBULATORY_CARE_PROVIDER_SITE_OTHER): Payer: Self-pay | Admitting: Psychology

## 2020-09-17 DIAGNOSIS — F4323 Adjustment disorder with mixed anxiety and depressed mood: Secondary | ICD-10-CM

## 2020-10-01 ENCOUNTER — Ambulatory Visit (INDEPENDENT_AMBULATORY_CARE_PROVIDER_SITE_OTHER): Payer: Self-pay | Admitting: Psychology

## 2020-10-01 DIAGNOSIS — F4323 Adjustment disorder with mixed anxiety and depressed mood: Secondary | ICD-10-CM

## 2020-10-03 ENCOUNTER — Other Ambulatory Visit: Payer: Self-pay | Admitting: Family Medicine

## 2020-10-10 ENCOUNTER — Other Ambulatory Visit: Payer: Self-pay | Admitting: Family Medicine

## 2020-10-12 ENCOUNTER — Other Ambulatory Visit: Payer: Self-pay

## 2020-10-12 ENCOUNTER — Encounter: Payer: Self-pay | Admitting: Family Medicine

## 2020-10-12 ENCOUNTER — Ambulatory Visit (INDEPENDENT_AMBULATORY_CARE_PROVIDER_SITE_OTHER): Payer: Self-pay | Admitting: Family Medicine

## 2020-10-12 VITALS — BP 160/80 | HR 63 | Temp 97.6°F | Ht 70.0 in | Wt 221.2 lb

## 2020-10-12 DIAGNOSIS — Z23 Encounter for immunization: Secondary | ICD-10-CM

## 2020-10-12 DIAGNOSIS — I10 Essential (primary) hypertension: Secondary | ICD-10-CM

## 2020-10-12 DIAGNOSIS — E118 Type 2 diabetes mellitus with unspecified complications: Secondary | ICD-10-CM

## 2020-10-12 DIAGNOSIS — I480 Paroxysmal atrial fibrillation: Secondary | ICD-10-CM

## 2020-10-12 LAB — POCT GLYCOSYLATED HEMOGLOBIN (HGB A1C): Hemoglobin A1C: 6.1 % — AB (ref 4.0–5.6)

## 2020-10-12 MED ORDER — LOSARTAN POTASSIUM-HCTZ 50-12.5 MG PO TABS
1.0000 | ORAL_TABLET | Freq: Every day | ORAL | 1 refills | Status: DC
Start: 2020-10-12 — End: 2021-04-02

## 2020-10-12 NOTE — Patient Instructions (Addendum)
Flu shot today  Pneumovax today.  Restart treadmill exercising - slowly build up duration of exercise.  Blood pressures are staying too high - start losartan/hydrochlorothiazide 50/12.5mg  in place of plain losartan. Return in 10 days for labs only (K and creatinine).  Keep me updated via MyChart on home blood pressures.  Sugars are looking great today! Keep up the good work.  Limit sodium/salt in the diet.

## 2020-10-12 NOTE — Assessment & Plan Note (Signed)
Chronic, again elevated today. Will add hctz 12.5mg  to regimen. RTC 10 days for BMP. I asked him to monitor BP closely at home and update [place] with readings over the next few weeks via mychart. Reviewed sodium in diet and importance of aerobic exercise routine. Goal BP <140/90, ideally lower if able. Pt agrees with plan.

## 2020-10-12 NOTE — Assessment & Plan Note (Signed)
Stable period on amiodarone, eliquis, toprol XL, and diltiazem

## 2020-10-12 NOTE — Assessment & Plan Note (Addendum)
Chronic, stable diet controlled. Congratulated on great control to date. Continue current regimen.  He will continue to monitor sugars closely at home.

## 2020-10-12 NOTE — Progress Notes (Signed)
This visit was conducted in person.  BP (!) 160/80 (BP Location: Right Arm, Patient Position: Sitting, Cuff Size: Large)   Pulse 63   Temp 97.6 F (36.4 C) (Temporal)   Ht 5\' 10"  (1.778 m)   Wt 221 lb 4 oz (100.4 kg)   SpO2 96%   BMI 31.75 kg/m   BP Readings from Last 3 Encounters:  10/12/20 (!) 160/80  07/28/20 140/86  04/10/20 (!) 146/78   180/90 on repeat CC: 68mo DM f/u visit  Subjective:    Patient ID: Jonathon Middleton, male    DOB: September 20, 1951, 69 y.o.   MRN: [id number]  HPI: Jonathon Middleton is a 69 y.o. male presenting on 10/12/2020 for Diabetes (Here for 6 mo f/u.)   Parox afib - saw cards 07/2020. Continues amiodarone and eliquis as well as toprol XL and diltiazem. CT coronary calcium score ordered, pending.   HTN - Compliant with current antihypertensive regimen of diltiazem 120mg  daily, losartan 50mg  daily, toprol XL 50mg  daily. Takes metoprolol at night time. Does check blood pressures at home: 140 systolic. No low blood pressure readings or symptoms of dizziness/syncope. Denies HA, vision changes, CP/tightness, SOB. Occasional leg swelling.   DM - does regularly check sugars: 120-130 fasting. Compliant with antihyperglycemic regimen which includes: diet controlled. Denies low sugars or hypoglycemic symptoms. Denies paresthesias. Last diabetic eye exam 02/2020. Pneumovax: 10/2013, rpt due. Prevnar: 10/2019. Glucometer brand: onetouch ultra. DSME: completed 2010. Lab Results  Component Value Date   HGBA1C 6.1 (A) 10/12/2020   Diabetic Foot Exam - Simple   Simple Foot Form Diabetic Foot exam was performed with the following findings: Yes 10/12/2020  8:26 AM  Visual Inspection No deformities, no ulcerations, no other skin breakdown bilaterally: Yes Sensation Testing Intact to touch and monofilament testing bilaterally: Yes Pulse Check Posterior Tibialis and Dorsalis pulse intact bilaterally: Yes Comments    Lab Results  Component Value Date   MICROALBUR 0.9  09/29/2017        Relevant past medical, surgical, family and social history reviewed and updated as indicated. Interim medical history since our last visit reviewed. Allergies and medications reviewed and updated. Outpatient Medications Prior to Visit  Medication Sig Dispense Refill  . amiodarone (PACERONE) 200 MG tablet TAKE 1 TABLET BY MOUTH EVERY DAY 90 tablet 1  . apixaban (ELIQUIS) 5 MG TABS tablet Take 1 tablet (5 mg total) by mouth 2 (two) times daily. 180 tablet 1  . atorvastatin (LIPITOR) 40 MG tablet TAKE 1 TABLET BY MOUTH EVERYDAY AT BEDTIME 90 tablet 3  . Cholecalciferol (VITAMIN D) 2000 units CAPS Take 2,000 Units by mouth 2 (two) times daily.     . Coenzyme Q10 (CO Q-10) 100 MG CAPS Take 100 mg by mouth daily with breakfast.     . diltiazem (CARDIZEM CD) 120 MG 24 hr capsule TAKE 1 CAPSULE BY MOUTH EVERY DAY 90 capsule 3  . glucose blood (ONE TOUCH ULTRA TEST) test strip Use to check sugar once daily and as needed. Dx: E11.9 100 each 3  . Lancets (ONETOUCH ULTRASOFT) lancets USE AS INSTRUCTED 100 each 0  . metoprolol succinate (TOPROL-XL) 50 MG 24 hr tablet TAKE 1 TABLET BY MOUTH EVERY DAY 90 tablet 3  . Multiple Vitamin (MULTIVITAMIN WITH MINERALS) TABS tablet Take 1 tablet by mouth daily with breakfast.    . omega-3 fish oil (MAXEPA) 1000 MG CAPS capsule Take 1 capsule by mouth 2 (two) times daily.     . sertraline (ZOLOFT) 100 MG tablet  TAKE 1 TABLET BY MOUTH EVERY DAY 90 tablet 3  . TURMERIC PO Take by mouth in the morning and at bedtime.    Marland Kitchen losartan (COZAAR) 50 MG tablet Take 1 tablet (50 mg total) by mouth daily. 90 tablet 1   No facility-administered medications prior to visit.     Per HPI unless specifically indicated in ROS section below Review of Systems Objective:  BP (!) 160/80 (BP Location: Right Arm, Patient Position: Sitting, Cuff Size: Large)   Pulse 63   Temp 97.6 F (36.4 C) (Temporal)   Ht 5\' 10"  (1.778 m)   Wt 221 lb 4 oz (100.4 kg)   SpO2 96%    BMI 31.75 kg/m   Wt Readings from Last 3 Encounters:  10/12/20 221 lb 4 oz (100.4 kg)  07/28/20 219 lb (99.3 kg)  04/10/20 215 lb 4 oz (97.6 kg)      Physical Exam Vitals and nursing note reviewed.  Constitutional:      General: He is not in acute distress.    Appearance: Normal appearance. He is well-developed. He is not ill-appearing.  HENT:     Head: Normocephalic and atraumatic.  Eyes:     General: No scleral icterus.    Extraocular Movements: Extraocular movements intact.     Conjunctiva/sclera: Conjunctivae normal.     Pupils: Pupils are equal, round, and reactive to light.  Cardiovascular:     Rate and Rhythm: Normal rate and regular rhythm.     Pulses: Normal pulses.     Heart sounds: Normal heart sounds. No murmur heard.   Pulmonary:     Effort: Pulmonary effort is normal. No respiratory distress.     Breath sounds: Normal breath sounds. No wheezing, rhonchi or rales.  Musculoskeletal:        General: Normal range of motion.     Cervical back: Normal range of motion and neck supple.     Right lower leg: No edema.     Left lower leg: No edema.     Comments: See HPI for foot exam if done  Lymphadenopathy:     Cervical: No cervical adenopathy.  Skin:    General: Skin is warm and dry.     Findings: No rash.  Neurological:     Mental Status: He is alert.  Psychiatric:        Mood and Affect: Mood normal.        Behavior: Behavior normal.       Results for orders placed or performed in visit on 10/12/20  POCT glycosylated hemoglobin (Hb A1C)  Result Value Ref Range   Hemoglobin A1C 6.1 (A) 4.0 - 5.6 %   HbA1c POC (<> result, manual entry)     HbA1c, POC (prediabetic range)     HbA1c, POC (controlled diabetic range)     Assessment & Plan:  This visit occurred during the SARS-CoV-2 public health emergency.  Safety protocols were in place, including screening questions prior to the visit, additional usage of staff PPE, and extensive cleaning of exam room  while observing appropriate contact time as indicated for disinfecting solutions.   Problem List Items Addressed This Visit    Paroxysmal atrial fibrillation (HCC)    Stable period on amiodarone, eliquis, toprol XL, and diltiazem      Relevant Medications   losartan-hydrochlorothiazide (HYZAAR) 50-12.5 MG tablet   Essential hypertension - Primary    Chronic, again elevated today. Will add hctz 12.5mg  to regimen. RTC 10 days for BMP. I  asked him to monitor BP closely at home and update [place] with readings over the next few weeks via mychart. Reviewed sodium in diet and importance of aerobic exercise routine. Goal BP <140/90, ideally lower if able. Pt agrees with plan.       Relevant Medications   losartan-hydrochlorothiazide (HYZAAR) 50-12.5 MG tablet   Other Relevant Orders   Basic metabolic panel   Controlled diabetes mellitus type 2 with complications (HCC)    Chronic, stable diet controlled. Congratulated on great control to date. Continue current regimen.  He will continue to monitor sugars closely at home.       Relevant Medications   losartan-hydrochlorothiazide (HYZAAR) 50-12.5 MG tablet   Other Relevant Orders   POCT glycosylated hemoglobin (Hb A1C) (Completed)    Other Visit Diagnoses    Need for influenza vaccination       Relevant Orders   Flu Vaccine QUAD High Dose(Fluad) (Completed)   Need for 23-polyvalent pneumococcal polysaccharide vaccine       Relevant Orders   Pneumococcal polysaccharide vaccine 23-valent greater than or equal to 2yo subcutaneous/IM (Completed)       Meds ordered this encounter  Medications  . losartan-hydrochlorothiazide (HYZAAR) 50-12.5 MG tablet    Sig: Take 1 tablet by mouth daily.    Dispense:  90 tablet    Refill:  1   Orders Placed This Encounter  Procedures  . Flu Vaccine QUAD High Dose(Fluad)  . Pneumococcal polysaccharide vaccine 23-valent greater than or equal to 2yo subcutaneous/IM  . Basic metabolic panel    Standing  Status:   Future    Standing Expiration Date:   10/12/2021  . POCT glycosylated hemoglobin (Hb A1C)    Patient Instructions  Flu shot today  Pneumovax today.  Restart treadmill exercising - slowly build up duration of exercise.  Blood pressures are staying too high - start losartan/hydrochlorothiazide 50/12.5mg  in place of plain losartan. Return in 10 days for labs only (K and creatinine).  Keep me updated via MyChart on home blood pressures.  Sugars are looking great today! Keep up the good work.  Limit sodium/salt in the diet.    Follow up plan: Return in about 6 months (around 04/11/2021), or if symptoms worsen or fail to improve, for annual exam, prior fasting for blood work, medicare wellness visit.  Eustaquio Boyden, MD

## 2020-10-14 ENCOUNTER — Encounter: Payer: Self-pay | Admitting: Family Medicine

## 2020-10-15 ENCOUNTER — Ambulatory Visit (INDEPENDENT_AMBULATORY_CARE_PROVIDER_SITE_OTHER): Payer: Self-pay | Admitting: Psychology

## 2020-10-15 DIAGNOSIS — F4323 Adjustment disorder with mixed anxiety and depressed mood: Secondary | ICD-10-CM

## 2020-10-22 ENCOUNTER — Other Ambulatory Visit (INDEPENDENT_AMBULATORY_CARE_PROVIDER_SITE_OTHER): Payer: Self-pay

## 2020-10-22 ENCOUNTER — Other Ambulatory Visit: Payer: Self-pay

## 2020-10-22 DIAGNOSIS — I10 Essential (primary) hypertension: Secondary | ICD-10-CM

## 2020-10-22 LAB — BASIC METABOLIC PANEL
BUN: 25 mg/dL — ABNORMAL HIGH (ref 6–23)
CO2: 29 mEq/L (ref 19–32)
Calcium: 8.9 mg/dL (ref 8.4–10.5)
Chloride: 105 mEq/L (ref 96–112)
Creatinine, Ser: 1.12 mg/dL (ref 0.40–1.50)
GFR: 67.72 mL/min (ref >60.00)
Glucose, Bld: 108 mg/dL — ABNORMAL HIGH (ref 70–99)
Potassium: 4.1 mEq/L (ref 3.5–5.1)
Sodium: 143 mEq/L (ref 135–145)

## 2020-11-12 ENCOUNTER — Ambulatory Visit (INDEPENDENT_AMBULATORY_CARE_PROVIDER_SITE_OTHER): Payer: Self-pay | Admitting: Psychology

## 2020-11-12 DIAGNOSIS — F4323 Adjustment disorder with mixed anxiety and depressed mood: Secondary | ICD-10-CM

## 2020-11-26 ENCOUNTER — Ambulatory Visit (INDEPENDENT_AMBULATORY_CARE_PROVIDER_SITE_OTHER): Payer: Self-pay | Admitting: Psychology

## 2020-11-26 DIAGNOSIS — F4323 Adjustment disorder with mixed anxiety and depressed mood: Secondary | ICD-10-CM

## 2020-12-01 ENCOUNTER — Other Ambulatory Visit: Payer: Self-pay | Admitting: Family Medicine

## 2020-12-10 ENCOUNTER — Ambulatory Visit (INDEPENDENT_AMBULATORY_CARE_PROVIDER_SITE_OTHER): Payer: Self-pay | Admitting: Psychology

## 2020-12-10 DIAGNOSIS — F4323 Adjustment disorder with mixed anxiety and depressed mood: Secondary | ICD-10-CM

## 2020-12-24 ENCOUNTER — Ambulatory Visit (INDEPENDENT_AMBULATORY_CARE_PROVIDER_SITE_OTHER): Payer: Self-pay | Admitting: Psychology

## 2020-12-24 DIAGNOSIS — F331 Major depressive disorder, recurrent, moderate: Secondary | ICD-10-CM

## 2021-01-02 ENCOUNTER — Other Ambulatory Visit: Payer: Self-pay | Admitting: Family Medicine

## 2021-01-04 ENCOUNTER — Other Ambulatory Visit: Payer: Self-pay | Admitting: Family Medicine

## 2021-01-04 NOTE — Telephone Encounter (Signed)
Pharmacy requests refill on: Diltiazem ER 120 mg   LAST REFILL: 10/11/2019 (Q-90, R-3) LAST OV: 10/12/2020 NEXT OV: Not Scheduled  PHARMACY: [id number] [place], [place]   Pharmacy requests refill on: Amiodarone HCL 200 mg   LAST REFILL: 07/10/2020 (Q-90, R-1) LAST OV: 10/12/2020 NEXT OV: Not Scheduled  PHARMACY: [id number] [place], [place]

## 2021-01-07 ENCOUNTER — Ambulatory Visit: Payer: Self-pay | Admitting: Psychology

## 2021-01-21 ENCOUNTER — Ambulatory Visit (INDEPENDENT_AMBULATORY_CARE_PROVIDER_SITE_OTHER): Payer: Self-pay | Admitting: Psychology

## 2021-01-21 DIAGNOSIS — F4323 Adjustment disorder with mixed anxiety and depressed mood: Secondary | ICD-10-CM

## 2021-02-04 ENCOUNTER — Ambulatory Visit (INDEPENDENT_AMBULATORY_CARE_PROVIDER_SITE_OTHER): Payer: Self-pay | Admitting: Psychology

## 2021-02-04 DIAGNOSIS — F4323 Adjustment disorder with mixed anxiety and depressed mood: Secondary | ICD-10-CM

## 2021-02-18 ENCOUNTER — Ambulatory Visit (INDEPENDENT_AMBULATORY_CARE_PROVIDER_SITE_OTHER): Payer: Self-pay | Admitting: Psychology

## 2021-02-18 DIAGNOSIS — F4323 Adjustment disorder with mixed anxiety and depressed mood: Secondary | ICD-10-CM

## 2021-03-04 ENCOUNTER — Ambulatory Visit (INDEPENDENT_AMBULATORY_CARE_PROVIDER_SITE_OTHER): Payer: Self-pay | Admitting: Psychology

## 2021-03-04 DIAGNOSIS — F4323 Adjustment disorder with mixed anxiety and depressed mood: Secondary | ICD-10-CM

## 2021-03-18 ENCOUNTER — Ambulatory Visit (INDEPENDENT_AMBULATORY_CARE_PROVIDER_SITE_OTHER): Payer: Self-pay | Admitting: Psychology

## 2021-03-18 DIAGNOSIS — F4323 Adjustment disorder with mixed anxiety and depressed mood: Secondary | ICD-10-CM

## 2021-04-01 ENCOUNTER — Telehealth: Payer: Self-pay | Admitting: Family Medicine

## 2021-04-01 ENCOUNTER — Ambulatory Visit (INDEPENDENT_AMBULATORY_CARE_PROVIDER_SITE_OTHER): Payer: Self-pay | Admitting: Psychology

## 2021-04-01 DIAGNOSIS — F4323 Adjustment disorder with mixed anxiety and depressed mood: Secondary | ICD-10-CM

## 2021-04-02 NOTE — Telephone Encounter (Signed)
Noted  

## 2021-04-02 NOTE — Telephone Encounter (Signed)
E-scribed refill.  Plz schedule wellness, lab and cpe visits.  

## 2021-04-02 NOTE — Telephone Encounter (Signed)
Patient is scheduled EM 

## 2021-04-15 ENCOUNTER — Ambulatory Visit (INDEPENDENT_AMBULATORY_CARE_PROVIDER_SITE_OTHER): Payer: Self-pay | Admitting: Psychology

## 2021-04-15 DIAGNOSIS — F4323 Adjustment disorder with mixed anxiety and depressed mood: Secondary | ICD-10-CM

## 2021-04-29 ENCOUNTER — Ambulatory Visit (INDEPENDENT_AMBULATORY_CARE_PROVIDER_SITE_OTHER): Payer: Self-pay | Admitting: Psychology

## 2021-04-29 DIAGNOSIS — F4323 Adjustment disorder with mixed anxiety and depressed mood: Secondary | ICD-10-CM

## 2021-05-13 ENCOUNTER — Ambulatory Visit (INDEPENDENT_AMBULATORY_CARE_PROVIDER_SITE_OTHER): Payer: Self-pay | Admitting: Psychology

## 2021-05-13 DIAGNOSIS — F4323 Adjustment disorder with mixed anxiety and depressed mood: Secondary | ICD-10-CM

## 2021-05-27 ENCOUNTER — Other Ambulatory Visit: Payer: Self-pay

## 2021-05-27 ENCOUNTER — Ambulatory Visit (INDEPENDENT_AMBULATORY_CARE_PROVIDER_SITE_OTHER): Payer: Self-pay | Admitting: Psychology

## 2021-05-27 DIAGNOSIS — F4323 Adjustment disorder with mixed anxiety and depressed mood: Secondary | ICD-10-CM

## 2021-05-30 ENCOUNTER — Other Ambulatory Visit: Payer: Self-pay | Admitting: Family Medicine

## 2021-05-30 DIAGNOSIS — I480 Paroxysmal atrial fibrillation: Secondary | ICD-10-CM

## 2021-05-30 DIAGNOSIS — E785 Hyperlipidemia, unspecified: Secondary | ICD-10-CM

## 2021-05-30 DIAGNOSIS — R972 Elevated prostate specific antigen [PSA]: Secondary | ICD-10-CM

## 2021-05-30 DIAGNOSIS — E118 Type 2 diabetes mellitus with unspecified complications: Secondary | ICD-10-CM

## 2021-05-30 DIAGNOSIS — E1169 Type 2 diabetes mellitus with other specified complication: Secondary | ICD-10-CM

## 2021-06-02 ENCOUNTER — Ambulatory Visit: Payer: Self-pay

## 2021-06-02 ENCOUNTER — Other Ambulatory Visit: Payer: Self-pay

## 2021-06-02 ENCOUNTER — Telehealth: Payer: Self-pay

## 2021-06-02 ENCOUNTER — Other Ambulatory Visit (INDEPENDENT_AMBULATORY_CARE_PROVIDER_SITE_OTHER): Payer: Self-pay

## 2021-06-02 DIAGNOSIS — E118 Type 2 diabetes mellitus with unspecified complications: Secondary | ICD-10-CM

## 2021-06-02 DIAGNOSIS — E1169 Type 2 diabetes mellitus with other specified complication: Secondary | ICD-10-CM

## 2021-06-02 DIAGNOSIS — I480 Paroxysmal atrial fibrillation: Secondary | ICD-10-CM

## 2021-06-02 DIAGNOSIS — R972 Elevated prostate specific antigen [PSA]: Secondary | ICD-10-CM

## 2021-06-02 DIAGNOSIS — E785 Hyperlipidemia, unspecified: Secondary | ICD-10-CM

## 2021-06-02 LAB — COMPREHENSIVE METABOLIC PANEL
ALT: 28 U/L (ref 0–53)
AST: 25 U/L (ref 0–37)
Albumin: 4.3 g/dL (ref 3.5–5.2)
Alkaline Phosphatase: 100 U/L (ref 39–117)
BUN: 27 mg/dL — ABNORMAL HIGH (ref 6–23)
CO2: 29 mEq/L (ref 19–32)
Calcium: 9.1 mg/dL (ref 8.4–10.5)
Chloride: 104 mEq/L (ref 96–112)
Creatinine, Ser: 1.29 mg/dL (ref 0.40–1.50)
GFR: 56.91 mL/min — ABNORMAL LOW (ref >60.00)
Glucose, Bld: 112 mg/dL — ABNORMAL HIGH (ref 70–99)
Potassium: 4.4 mEq/L (ref 3.5–5.1)
Sodium: 141 mEq/L (ref 135–145)
Total Bilirubin: 0.8 mg/dL (ref 0.2–1.2)
Total Protein: 6.5 g/dL (ref 6.0–8.3)

## 2021-06-02 LAB — HEMOGLOBIN A1C: Hgb A1c MFr Bld: 6.6 % — ABNORMAL HIGH (ref 4.6–6.5)

## 2021-06-02 LAB — CBC WITH DIFFERENTIAL/PLATELET
Basophils Absolute: 0 10*3/uL (ref 0.0–0.1)
Basophils Relative: 0.7 % (ref 0.0–3.0)
Eosinophils Absolute: 0.3 10*3/uL (ref 0.0–0.7)
Eosinophils Relative: 5.6 % — ABNORMAL HIGH (ref 0.0–5.0)
HCT: 43 % (ref 39.0–52.0)
Hemoglobin: 14.6 g/dL (ref 13.0–17.0)
Lymphocytes Relative: 26.8 % (ref 12.0–46.0)
Lymphs Abs: 1.5 10*3/uL (ref 0.7–4.0)
MCHC: 34 g/dL (ref 30.0–36.0)
MCV: 88.8 fl (ref 78.0–100.0)
Monocytes Absolute: 0.8 10*3/uL (ref 0.1–1.0)
Monocytes Relative: 13.5 % — ABNORMAL HIGH (ref 3.0–12.0)
Neutro Abs: 3 10*3/uL (ref 1.4–7.7)
Neutrophils Relative %: 53.4 % (ref 43.0–77.0)
Platelets: 212 10*3/uL (ref 150.0–400.0)
RBC: 4.84 Mil/uL (ref 4.22–5.81)
RDW: 14.4 % (ref 11.5–15.5)
WBC: 5.6 10*3/uL (ref 4.0–10.5)

## 2021-06-02 LAB — LIPID PANEL
Cholesterol: 144 mg/dL (ref 0–200)
HDL: 37.5 mg/dL — ABNORMAL LOW (ref >39.00)
LDL Cholesterol: 81 mg/dL (ref 0–99)
NonHDL: 106.1
Total CHOL/HDL Ratio: 4
Triglycerides: 128 mg/dL (ref 0.0–149.0)
VLDL: 25.6 mg/dL (ref 0.0–40.0)

## 2021-06-02 LAB — PSA: PSA: 1.65 ng/mL (ref 0.10–4.00)

## 2021-06-02 NOTE — Telephone Encounter (Signed)
Called 3 times trying to complete AWV. Patient never answered. Left message on voicemail notifying patient I called and he can call to reschedule at his convenience. Appointment cancelled.

## 2021-06-09 ENCOUNTER — Ambulatory Visit (INDEPENDENT_AMBULATORY_CARE_PROVIDER_SITE_OTHER): Payer: Self-pay | Admitting: Family Medicine

## 2021-06-09 ENCOUNTER — Encounter: Payer: Self-pay | Admitting: Family Medicine

## 2021-06-09 ENCOUNTER — Other Ambulatory Visit: Payer: Self-pay

## 2021-06-09 VITALS — BP 134/76 | HR 59 | Temp 97.6°F | Ht 69.0 in | Wt 221.2 lb

## 2021-06-09 DIAGNOSIS — I480 Paroxysmal atrial fibrillation: Secondary | ICD-10-CM

## 2021-06-09 DIAGNOSIS — Z7189 Other specified counseling: Secondary | ICD-10-CM

## 2021-06-09 DIAGNOSIS — Z0000 Encounter for general adult medical examination without abnormal findings: Secondary | ICD-10-CM

## 2021-06-09 DIAGNOSIS — F331 Major depressive disorder, recurrent, moderate: Secondary | ICD-10-CM

## 2021-06-09 DIAGNOSIS — Z87891 Personal history of nicotine dependence: Secondary | ICD-10-CM

## 2021-06-09 DIAGNOSIS — E1169 Type 2 diabetes mellitus with other specified complication: Secondary | ICD-10-CM

## 2021-06-09 DIAGNOSIS — E785 Hyperlipidemia, unspecified: Secondary | ICD-10-CM

## 2021-06-09 DIAGNOSIS — I10 Essential (primary) hypertension: Secondary | ICD-10-CM

## 2021-06-09 DIAGNOSIS — E118 Type 2 diabetes mellitus with unspecified complications: Secondary | ICD-10-CM

## 2021-06-09 DIAGNOSIS — E669 Obesity, unspecified: Secondary | ICD-10-CM

## 2021-06-09 DIAGNOSIS — Z1211 Encounter for screening for malignant neoplasm of colon: Secondary | ICD-10-CM

## 2021-06-09 DIAGNOSIS — E66811 Obesity, class 1: Secondary | ICD-10-CM

## 2021-06-09 MED ORDER — OZEMPIC (0.25 OR 0.5 MG/DOSE) 2 MG/1.5ML ~~LOC~~ SOPN: PEN_INJECTOR | SUBCUTANEOUS | 6 refills | Status: AC

## 2021-06-09 MED ORDER — LOSARTAN POTASSIUM-HCTZ 50-12.5 MG PO TABS: 1.0000 | ORAL_TABLET | Freq: Every day | ORAL | 3 refills | Status: DC

## 2021-06-09 NOTE — Assessment & Plan Note (Addendum)
Chronic, diet controlled. A1c trending up. Requests trial of ozempic. Will send to pharmacy to price out.

## 2021-06-09 NOTE — Assessment & Plan Note (Signed)
Stable period on sertraline 100mg daily.  

## 2021-06-09 NOTE — Assessment & Plan Note (Addendum)
Continue amiodarone, eliquis, toprol XL, dilt.  Followed by cardiology.

## 2021-06-09 NOTE — Assessment & Plan Note (Signed)
Chronic stable on statin and fish oil - continue. The 10-year ASCVD risk score Denman George DC Montez Hageman., et al., 2013) is: 33.1%   Values used to calculate the score:     Age: 70 years     Sex: Male     Is Non-Hispanic African American: No     Diabetic: Yes     Tobacco smoker: No     Systolic Blood Pressure: 134 mmHg     Is BP treated: Yes     HDL Cholesterol: 37.5 mg/dL     Total Cholesterol: 144 mg/dL

## 2021-06-09 NOTE — Assessment & Plan Note (Signed)
Encouraged healthy diet and lifestyle choices to affect sustainable weight loss.  ?

## 2021-06-09 NOTE — Assessment & Plan Note (Signed)

## 2021-06-09 NOTE — Assessment & Plan Note (Signed)
Chronic, stable. Continue current regimen. 
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
## 2021-06-09 NOTE — Patient Instructions (Addendum)
If interested, check with pharmacy about new 2 shot shingles series (shingrix).  Bring [place] a copy of your living will  Price out ozempic - sent to pharmacy.  Continue regular walking routine.  Return as needed or in 6 months for follow up visit.   Health Maintenance After Age 69 After age 26, you are at a higher risk for certain long-term diseases and infections as well as injuries from falls. Falls are a major cause of broken bones and head injuries in people who are older than age 37. Getting regular preventive care can help to keep you healthy and well. Preventive care includes getting regular testing and making lifestyle changes as recommended by your health care provider. Talk with your health care provider about: Which screenings and tests you should have. A screening is a test that checks for a disease when you have no symptoms. A diet and exercise plan that is right for you. What should I know about screenings and tests to prevent falls? Screening and testing are the best ways to find a health problem early. Early diagnosis and treatment give you the best chance of managing medical conditions that are common after age 29. Certain conditions and lifestyle choices may make you more likely to have a fall. Your health care provider may recommend: Regular vision checks. Poor vision and conditions such as cataracts can make you more likely to have a fall. If you wear glasses, make sure to get your prescription updated if your vision changes. Medicine review. Work with your health care provider to regularly review all of the medicines you are taking, including over-the-counter medicines. Ask your health care provider about any side effects that may make you more likely to have a fall. Tell your health care provider if any medicines that you take make you feel dizzy or sleepy. Osteoporosis screening. Osteoporosis is a condition that causes the bones to get weaker. This can make the bones weak and cause  them to break more easily. Blood pressure screening. Blood pressure changes and medicines to control blood pressure can make you feel dizzy. Strength and balance checks. Your health care provider may recommend certain tests to check your strength and balance while standing, walking, or changing positions. Foot health exam. Foot pain and numbness, as well as not wearing proper footwear, can make you more likely to have a fall. Depression screening. You may be more likely to have a fall if you have a fear of falling, feel emotionally low, or feel unable to do activities that you used to do. Alcohol use screening. Using too much alcohol can affect your balance and may make you more likely to have a fall. What actions can I take to lower my risk of falls? General instructions Talk with your health care provider about your risks for falling. Tell your health care provider if: You fall. Be sure to tell your health care provider about all falls, even ones that seem minor. You feel dizzy, sleepy, or off-balance. Take over-the-counter and prescription medicines only as told by your health care provider. These include any supplements. Eat a healthy diet and maintain a healthy weight. A healthy diet includes low-fat dairy products, low-fat (lean) meats, and fiber from whole grains, beans, and lots of fruits and vegetables. Home safety Remove any tripping hazards, such as rugs, cords, and clutter. Install safety equipment such as grab bars in bathrooms and safety rails on stairs. Keep rooms and walkways well-lit. Activity  Follow a regular exercise program to  stay fit. This will help you maintain your balance. Ask your health care provider what types of exercise are appropriate for you. If you need a cane or walker, use it as recommended by your health care provider. Wear supportive shoes that have nonskid soles.  Lifestyle Do not drink alcohol if your health care provider tells you not to drink. If you  drink alcohol, limit how much you have: 0-1 drink a day for women. 0-2 drinks a day for men. Be aware of how much alcohol is in your drink. In the [place] one drink equals one typical bottle of beer (12 oz), one-half glass of wine (5 oz), or one shot of hard liquor (1 oz). Do not use any products that contain nicotine or tobacco, such as cigarettes and e-cigarettes. If you need help quitting, ask your health care provider. Summary Having a healthy lifestyle and getting preventive care can help to protect your health and wellness after age 21. Screening and testing are the best way to find a health problem early and help you avoid having a fall. Early diagnosis and treatment give you the best chance for managing medical conditions that are more common for people who are older than age 74. Falls are a major cause of broken bones and head injuries in people who are older than age 18. Take precautions to prevent a fall at home. Work with your health care provider to learn what changes you can make to improve your health and wellness and to prevent falls. This information is not intended to replace advice given to you by your health care provider. Make sure you discuss any questions you have with your healthcare provider. Document Revised: 11/06/2020 Document Reviewed: 11/06/2020 Elsevier Patient Education  2022 [employer].

## 2021-06-09 NOTE — Assessment & Plan Note (Signed)
Advanced directive discussion: has living will at home, does want CPR but doesn't want prolonged life support if terminal condition. Would want son Jonathon Middleton to be HCPOA. Will bring us copy.  

## 2021-06-09 NOTE — Assessment & Plan Note (Signed)
Remote, <5 PY hx

## 2021-06-21 ENCOUNTER — Other Ambulatory Visit: Payer: Self-pay | Admitting: Family Medicine

## 2021-06-24 ENCOUNTER — Ambulatory Visit (INDEPENDENT_AMBULATORY_CARE_PROVIDER_SITE_OTHER): Payer: Self-pay | Admitting: Psychology

## 2021-06-24 DIAGNOSIS — F4323 Adjustment disorder with mixed anxiety and depressed mood: Secondary | ICD-10-CM

## 2021-06-25 LAB — COLOGUARD: Cologuard: NEGATIVE

## 2021-07-08 ENCOUNTER — Ambulatory Visit (INDEPENDENT_AMBULATORY_CARE_PROVIDER_SITE_OTHER): Payer: Self-pay | Admitting: Psychology

## 2021-07-08 DIAGNOSIS — F4323 Adjustment disorder with mixed anxiety and depressed mood: Secondary | ICD-10-CM

## 2021-07-22 ENCOUNTER — Ambulatory Visit (INDEPENDENT_AMBULATORY_CARE_PROVIDER_SITE_OTHER): Payer: Self-pay | Admitting: Psychology

## 2021-07-22 DIAGNOSIS — F4323 Adjustment disorder with mixed anxiety and depressed mood: Secondary | ICD-10-CM

## 2021-08-05 ENCOUNTER — Ambulatory Visit (INDEPENDENT_AMBULATORY_CARE_PROVIDER_SITE_OTHER): Payer: Self-pay | Admitting: Psychology

## 2021-08-05 DIAGNOSIS — F4323 Adjustment disorder with mixed anxiety and depressed mood: Secondary | ICD-10-CM

## 2021-08-19 ENCOUNTER — Ambulatory Visit (INDEPENDENT_AMBULATORY_CARE_PROVIDER_SITE_OTHER): Payer: Self-pay | Admitting: Psychology

## 2021-08-19 DIAGNOSIS — F4323 Adjustment disorder with mixed anxiety and depressed mood: Secondary | ICD-10-CM

## 2021-09-02 ENCOUNTER — Ambulatory Visit (INDEPENDENT_AMBULATORY_CARE_PROVIDER_SITE_OTHER): Payer: Self-pay | Admitting: Psychology

## 2021-09-02 DIAGNOSIS — F4323 Adjustment disorder with mixed anxiety and depressed mood: Secondary | ICD-10-CM

## 2021-09-16 ENCOUNTER — Ambulatory Visit (INDEPENDENT_AMBULATORY_CARE_PROVIDER_SITE_OTHER): Payer: Self-pay | Admitting: Psychology

## 2021-09-16 DIAGNOSIS — F4323 Adjustment disorder with mixed anxiety and depressed mood: Secondary | ICD-10-CM

## 2021-09-30 ENCOUNTER — Ambulatory Visit (INDEPENDENT_AMBULATORY_CARE_PROVIDER_SITE_OTHER): Payer: Self-pay | Admitting: Psychology

## 2021-09-30 DIAGNOSIS — F4323 Adjustment disorder with mixed anxiety and depressed mood: Secondary | ICD-10-CM

## 2021-10-06 ENCOUNTER — Other Ambulatory Visit: Payer: Self-pay | Admitting: Family Medicine

## 2021-10-14 ENCOUNTER — Ambulatory Visit (INDEPENDENT_AMBULATORY_CARE_PROVIDER_SITE_OTHER): Payer: Self-pay | Admitting: Psychology

## 2021-10-14 DIAGNOSIS — F4323 Adjustment disorder with mixed anxiety and depressed mood: Secondary | ICD-10-CM

## 2021-11-11 ENCOUNTER — Ambulatory Visit (INDEPENDENT_AMBULATORY_CARE_PROVIDER_SITE_OTHER): Payer: Self-pay | Admitting: Psychology

## 2021-11-11 DIAGNOSIS — F4323 Adjustment disorder with mixed anxiety and depressed mood: Secondary | ICD-10-CM

## 2021-11-11 NOTE — Progress Notes (Signed)
[facility] Counselor/Therapist Progress Note  Patient ID: Jonathon Middleton, MRN: [id number],    Date: 11/11/2021  Time Spent: 60   Treatment Type: Individual Therapy  Reported Symptoms: sadness, overwhelm  Mental Status Exam: Appearance:  Well Groomed     Behavior: Appropriate  Motor: Normal  Speech/Language:  Normal Rate  Affect: Appropriate  Mood: normal  Thought process: normal  Thought content:   WNL  Sensory/Perceptual disturbances:   WNL  Orientation: oriented to person, place, and time/date  Attention: Good  Concentration: Good  Memory: WNL  Fund of knowledge:  Good  Insight:   Good  Judgment:  Good  Impulse Control: Good   Risk Assessment: Danger to Self:  No Self-injurious Behavior: No Danger to Others: No Duty to Warn:no Physical Aggression / Violence:No  Access to Firearms a concern: No  Gang Involvement:No   Subjective: Pt present for face-to-face individual therapy via Webex.  Pt consents to telehealth video session due to COVID 19 pandemic.   Location of pt: home Location of therapist: home office.  Pt states he had a good Thanksgiving with family. Pt talked about concern about his good friend [name] who recently had a stroke.   [name] has some residual deficits from the stroke.  Addressed pt's concerns about his friend.   Pt talked about work.  He went to a training for several days that was interesting for him.  There have been other things about work that have been frustrating.   Pt has been wanting to work on getting in better shape bc he has had a lot of back pain.  He has followed through with the recommendation to set a goal to get on the treadmill for 5 minutes a day.  He ended up walking for 30 minutes.   Identified that stretches would be good for him.   Worked on Special educational needs teacher and compassion.   Worked on present moment mindfulness.   Provided supportive therapy.  Interventions: Cognitive Behavioral Therapy and Supportive  Therapy  Diagnosis:  F43.23    Plan:   Plan: Patient is to use CBT, mindfulness and coping skills to help manage decrease symptoms associated with their diagnosis.   Long-term goal:   Reduce overall level, frequency, and intensity of the feelings of depression, anxiety evidenced by decreased irritability, negative self talk, and helpless feelings.  Short-term goal:  Verbally express understanding of the relationship between feelings of depression, anxiety and their impact on thinking patterns and behaviors. Verbalize an understanding of the role that distorted thinking plays in creating fears, excessive worry, and ruminations.  See pt's treatment plan for depression and anxiety in Therapy Charts. (TP target date : 04/01/2022) Plan to meet every 2 weeks.    Cobie Marcoux, LCSW

## 2021-11-25 ENCOUNTER — Ambulatory Visit (INDEPENDENT_AMBULATORY_CARE_PROVIDER_SITE_OTHER): Payer: Self-pay | Admitting: Psychology

## 2021-11-25 DIAGNOSIS — F4323 Adjustment disorder with mixed anxiety and depressed mood: Secondary | ICD-10-CM

## 2021-11-25 NOTE — Progress Notes (Signed)
[facility] Counselor/Therapist Progress Note  Patient ID: Jonathon Middleton, MRN: [id number],    Date: 11/25/2021  Time Spent: 12:00pm-1:00pm   60 minutes  Treatment Type: Individual Therapy  Reported Symptoms: overwhelm  Mental Status Exam: Appearance:  Casual     Behavior: Appropriate  Motor: Normal  Speech/Language:  Normal Rate  Affect: Appropriate  Mood: normal  Thought process: normal  Thought content:   WNL  Sensory/Perceptual disturbances:   WNL  Orientation: oriented to person, place, and time/date  Attention: Good  Concentration: Good  Memory: WNL  Fund of knowledge:  Good  Insight:   Good  Judgment:  Good  Impulse Control: Good   Risk Assessment: Danger to Self:  No Self-injurious Behavior: No Danger to Others: No Duty to Warn:no Physical Aggression / Violence:No  Access to Firearms a concern: No  Gang Involvement:No   Subjective: Pt present for face-to-face individual therapy via Webex.  Pt consents to telehealth video session due to COVID 19 pandemic.   Location of pt: home Location of therapist: home office.  Pt talked about work.  He has had to participate in trainings that have been overwhelming.   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 Pt has had Christmas celebration with his kids and grandkids this past weekend.  It went well and he will be with the grandkids on Christmas morning as well. Pt will be alone for most of the holidays and has had some sadness about that.  He has adjusted his thoughts to be able to elevate his mood.   Pt states that most of his anxst is due to expectations that need to be adjusted.   Worked on Special educational needs teacher and compassion.   Worked on present moment mindfulness.   Pt talked about his son Jonathon Middleton.   He is talking about working on quitting smoking marijuana.  He is concerned about how to handle the anxiety.  Pt had an opportunity to talk to [facility] about seeking therapy.   Provided  supportive therapy.  Interventions: Cognitive Behavioral Therapy and Supportive Therapy  Diagnosis:  F43.23    Plan:   See pt's treatment plan for depression and anxiety in Therapy Charts. (TP target date : 04/01/2022) Pt is progressing toward treatment goals.  Plan to continue to meet every 2 weeks.    Jonathon Charlot, LCSW

## 2021-12-09 ENCOUNTER — Ambulatory Visit (INDEPENDENT_AMBULATORY_CARE_PROVIDER_SITE_OTHER): Payer: Self-pay | Admitting: Psychology

## 2021-12-09 DIAGNOSIS — F4323 Adjustment disorder with mixed anxiety and depressed mood: Secondary | ICD-10-CM

## 2021-12-09 NOTE — Progress Notes (Signed)
[facility] Counselor/Therapist Progress Note  Patient ID: Jonathon Middleton, MRN: [id number],    Date: 12/09/2021  Time Spent: 12:00pm-1:00pm   60 minutes  Treatment Type: Individual Therapy  Reported Symptoms: overwhelm  Mental Status Exam: Appearance:  Casual     Behavior: Appropriate  Motor: Normal  Speech/Language:  Normal Rate  Affect: Appropriate  Mood: normal  Thought process: normal  Thought content:   WNL  Sensory/Perceptual disturbances:   WNL  Orientation: oriented to person, place, and time/date  Attention: Good  Concentration: Good  Memory: WNL  Fund of knowledge:  Good  Insight:   Good  Judgment:  Good  Impulse Control: Good   Risk Assessment: Danger to Self:  No Self-injurious Behavior: No Danger to Others: No Duty to Warn:no Physical Aggression / Violence:No  Access to Firearms a concern: No  Gang Involvement:No   Subjective: Pt present for face-to-face individual therapy via Webex.  Pt consents to telehealth video session due to COVID 19 pandemic.   Location of pt: home Location of therapist: home office.  Pt talked about work.  He has had a busy morning at work.  Pt states work has been very stressful.  Pt is trying to implement the new training he had in December and it has been challenging.   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 Pt talked about the holidays.  He was a little lonely but managed it ok.  Pt has been doing well decreasing his alcohol consumption.  Pt had dinner with his youngest son and wife and was surprised at how much weight they had gained.  Pt states both his sons are struggling with anxiety.   Pt has had anxiety in the past and is talking with his sons about his experience and hopes to be able to help them.  Addressed pt's worries and concerns about his sons.    Pt talked about his friend [name] who had a stroke.  His friend has been healing and has few deficits from the stroke.  Pt talked with him and  was relieved that he seems to be doing better than he thought he was.   Pt and [name] are very close friends.      Provided supportive therapy.  Interventions: Cognitive Behavioral Therapy and Supportive Therapy  Diagnosis:  F43.23    Plan:   See pt's treatment plan for depression and anxiety in Therapy Charts. (TP target date : 04/01/2022) Pt is progressing toward treatment goals.  Plan to continue to meet every 2 weeks.    Trust Crago, LCSW

## 2021-12-23 ENCOUNTER — Ambulatory Visit (INDEPENDENT_AMBULATORY_CARE_PROVIDER_SITE_OTHER): Payer: Self-pay | Admitting: Psychology

## 2021-12-23 DIAGNOSIS — F4323 Adjustment disorder with mixed anxiety and depressed mood: Secondary | ICD-10-CM

## 2021-12-23 NOTE — Progress Notes (Signed)
[facility] Counselor/Therapist Progress Note  Patient ID: Jonathon Middleton, MRN: [id number],    Date: 12/23/2021  Time Spent: 12:00pm-1:00pm   60 minutes  Treatment Type: Individual Therapy  Reported Symptoms: overwhelm  Mental Status Exam: Appearance:  Casual     Behavior: Appropriate  Motor: Normal  Speech/Language:  Normal Rate  Affect: Appropriate  Mood: normal  Thought process: normal  Thought content:   WNL  Sensory/Perceptual disturbances:   WNL  Orientation: oriented to person, place, and time/date  Attention: Good  Concentration: Good  Memory: WNL  Fund of knowledge:  Good  Insight:   Good  Judgment:  Good  Impulse Control: Good   Risk Assessment: Danger to Self:  No Self-injurious Behavior: No Danger to Others: No Duty to Warn:no Physical Aggression / Violence:No  Access to Firearms a concern: No  Gang Involvement:No   Subjective: Pt present for face-to-face individual therapy via Webex.  Pt consents to telehealth video session due to COVID 19 pandemic.   Location of pt: home Location of therapist: home office.  Pt states that overall he has been fine.  He has had some ups and downs but he is managing the down times.   When pt has felt down recently he has "gone to a dark place".  Addressed what has triggered pt's sadness.   He is worried about his friend Jonathon Middleton who had a stroke.  Pt shared about some interactions he has had with Jonathon Middleton.   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  They have a rich friendship where they share on an emotional level.  Jonathon Middleton's stroke has been a reminder to pt of his own mortality.  Pt has had thoughts about death and wondering how he will fair as he ages.  Pt is worries about eventual loss of independence.  Helped pt process his thoughts and feelings.   Pt talked about work.   Addressed the work stress and worked on Optician, dispensing.  Pt talked about his oldest son.  He is going to have lunch  with him this weekend.  Pt is concerned about his son's anxiety and hopes to be able to talk with him about it.    Provided supportive therapy.  Interventions: Cognitive Behavioral Therapy and Supportive Therapy  Diagnosis:  F43.23    Plan:   See pt's treatment plan for depression and anxiety in Therapy Charts. (TP target date : 04/01/2022) Pt is progressing toward treatment goals.  Plan to continue to meet every 2 weeks.    Mang Hazelrigg, LCSW

## 2022-01-02 ENCOUNTER — Telehealth: Payer: Self-pay | Admitting: Family Medicine

## 2022-01-03 NOTE — Telephone Encounter (Signed)
Called pt to get him scheduled for follow up OV. Lvmtcb

## 2022-01-03 NOTE — Telephone Encounter (Signed)
Please call patient and schedule follow-up appointment per last office visit notes. Refill sent to pharmacy.

## 2022-01-06 ENCOUNTER — Ambulatory Visit (INDEPENDENT_AMBULATORY_CARE_PROVIDER_SITE_OTHER): Payer: Self-pay | Admitting: Psychology

## 2022-01-06 DIAGNOSIS — F4323 Adjustment disorder with mixed anxiety and depressed mood: Secondary | ICD-10-CM

## 2022-01-06 NOTE — Progress Notes (Signed)
[facility] Counselor/Therapist Progress Note  Patient ID: Jonathon Middleton, MRN: [id number],    Date: 01/06/2022  Time Spent: 12:00pm-1:00pm   60 minutes  Treatment Type: Individual Therapy  Reported Symptoms: overwhelm  Mental Status Exam: Appearance:  Casual     Behavior: Appropriate  Motor: Normal  Speech/Language:  Normal Rate  Affect: Appropriate  Mood: normal  Thought process: normal  Thought content:   WNL  Sensory/Perceptual disturbances:   WNL  Orientation: oriented to person, place, and time/date  Attention: Good  Concentration: Good  Memory: WNL  Fund of knowledge:  Good  Insight:   Good  Judgment:  Good  Impulse Control: Good   Risk Assessment: Danger to Self:  No Self-injurious Behavior: No Danger to Others: No Duty to Warn:no Physical Aggression / Violence:No  Access to Firearms a concern: No  Gang Involvement:No   Subjective: Pt present for face-to-face individual therapy via Webex.  Pt consents to telehealth video session due to COVID 19 pandemic.   Location of pt: home Location of therapist: home office.  Pt states he is "ok but not great".   He feels like he has failed in some things.  He has done a personal inventory and realized he is still comparing himself a lot to others and ends up feeling badly about himself.   Pt has recognized that being the people pleaser, overthinker, black and white thinker and codependent has impacted his self esteem a lot.  Worked with pt on self esteem issues.   Pt talked about work.  He has had some meetings that have been very frustrating bc there is no agenda or structure.  Pt states he needs clarity.  Addressed how he can focus on what he can control.   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  Pt states he has had a hard time focusing.  Pt has had trouble getting things accomplished.   Pt has not felt the joy of any little victories.  Addressed  how pt can identify and track his small wins.   Pt talked about meeting with his son for lunch.   They had a good talk.  Pt's son talked to him about his anxiety.   He has taken mushrooms to try to feel better and pt is concerned about it.  Pt talked with his son about his concerns and encouraged his son to consider taking an antidepressant.  Pt talked to his son about what he wants as he gets older.  Pt's son was very supportive of him and willing to keep an open dialogue.      Provided supportive therapy.  Interventions: Cognitive Behavioral Therapy and Supportive Therapy  Diagnosis:  F43.23    Plan:   See pt's treatment plan for depression and anxiety in Therapy Charts. (TP target date : 04/01/2022) Pt is progressing toward treatment goals.  Plan to continue to meet every 2 weeks.    Krithika Tome, LCSW

## 2022-01-10 ENCOUNTER — Other Ambulatory Visit: Payer: Self-pay | Admitting: Family Medicine

## 2022-01-13 NOTE — Telephone Encounter (Signed)
Duplicate request

## 2022-01-14 NOTE — Telephone Encounter (Signed)
Pt scheduled F/U appt in march

## 2022-01-14 NOTE — Telephone Encounter (Signed)
Noted  

## 2022-01-20 ENCOUNTER — Ambulatory Visit (INDEPENDENT_AMBULATORY_CARE_PROVIDER_SITE_OTHER): Payer: Self-pay | Admitting: Psychology

## 2022-01-20 DIAGNOSIS — F4323 Adjustment disorder with mixed anxiety and depressed mood: Secondary | ICD-10-CM

## 2022-01-20 NOTE — Progress Notes (Signed)
[facility] Counselor/Therapist Progress Note  Patient ID: Jonathon Middleton, MRN: [id number],    Date: 01/20/2022  Time Spent: 12:00pm-1:00pm   60 minutes  Treatment Type: Individual Therapy  Reported Symptoms: overwhelm, sadness  Mental Status Exam: Appearance:  Casual     Behavior: Appropriate  Motor: Normal  Speech/Language:  Normal Rate  Affect: Appropriate  Mood: normal  Thought process: normal  Thought content:   WNL  Sensory/Perceptual disturbances:   WNL  Orientation: oriented to person, place, and time/date  Attention: Good  Concentration: Good  Memory: WNL  Fund of knowledge:  Good  Insight:   Good  Judgment:  Good  Impulse Control: Good   Risk Assessment: Danger to Self:  No Self-injurious Behavior: No Danger to Others: No Duty to Warn:no Physical Aggression / Violence:No  Access to Firearms a concern: No  Gang Involvement:No   Subjective: Pt present for face-to-face individual therapy via Webex.  Pt consents to telehealth video session due to COVID 19 pandemic.   Location of pt: home Location of therapist: home office.  Pt is feeling down.   Pt's afib has increased which is worrisome to him.  There has been stress at work.  Addressed the issues that have been stressful and frustrating to pt.   Pt did some journaling.  Pt wrote that in order for him to be happy everyone needs to behave in a certain way.  Pt has realized that his expectations get in the way of his happiness.   Pt states he needs to be validated and get approval from others.   Pt has not been getting that and has been feeling down.  Pt has been very lonely.  He wants to be held.  He wants a romantic relationship.  Pt feels he will never have that again.   Pt has always defined himself by work.   His boss has been very critical of pt and his colleagues the past week.  This has been very deflating to pt.   He has tried to focus on the positive and identify the "wins" in his life but  it has been hard to do this past week.   Pt talked about going out to eat last weekend and socializing with people he met.  He compared himself to their lives and felt badly about his life. Pt states what really scares him is worrying that "this is it" regarding all he can expect out of life.   Pt wonders if he is fighting a battle that he is never going to win.  Pt feels a sense of hopelessness.   Addressed that pt is having an existential crisis.  Helped pt process his thoughts and feelings.   Worked with pt on self esteem issues.   Worked on Child psychotherapist.  Provided supportive therapy.  Interventions: Cognitive Behavioral Therapy and Supportive Therapy  Diagnosis:  F43.23    Plan:   See pt's treatment plan for depression and anxiety in Therapy Charts. (TP target date : 04/01/2022) Pt is progressing toward treatment goals.  Plan to continue to meet every 2 weeks.    Jonathon Reister, LCSW

## 2022-02-03 ENCOUNTER — Ambulatory Visit (INDEPENDENT_AMBULATORY_CARE_PROVIDER_SITE_OTHER): Payer: Self-pay | Admitting: Psychology

## 2022-02-03 DIAGNOSIS — F4323 Adjustment disorder with mixed anxiety and depressed mood: Secondary | ICD-10-CM

## 2022-02-03 NOTE — Progress Notes (Signed)
[facility] Counselor/Therapist Progress Note ? ?Patient ID: Jonathon Middleton, MRN: [id number],   ? ?Date: 02/03/2022 ? ?Time Spent: 12:00pm-1:00pm   60 minutes ? ?Treatment Type: Individual Therapy ? ?Reported Symptoms: overwhelm, sadness ? ?Mental Status Exam: ?Appearance:  Casual     ?Behavior: Appropriate  ?Motor: Normal  ?Speech/Language:  Normal Rate  ?Affect: Appropriate  ?Mood: normal  ?Thought process: normal  ?Thought content:   WNL  ?Sensory/Perceptual disturbances:   WNL  ?Orientation: oriented to person, place, and time/date  ?Attention: Good  ?Concentration: Good  ?Memory: WNL  ?Fund of knowledge:  Good  ?Insight:   Good  ?Judgment:  Good  ?Impulse Control: Good  ? ?Risk Assessment: ?Danger to Self:  No ?Self-injurious Behavior: No ?Danger to Others: No ?Duty to Warn:no ?Physical Aggression / Violence:No  ?Access to Firearms a concern: No  ?Gang Involvement:No  ? ?Subjective: ?Pt present for face-to-face individual therapy via Webex.  Pt consents to telehealth video session due to COVID 19 pandemic.   ?Location of pt: home ?Location of therapist: home office.  ?Pt talked about work.  He states work and life have been "tough".   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   Addressed the issues that have been frustrating for pt.   It is a tough environment in his job.  Pt states he needs for something good to happen.  Addressed how pt's self worth is attached to his outcomes at work.   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  He shows up and works hard every day but does not give himself credit for the work he does.  Worked on problem solving and stress management. ?Pt's health has been concerning.  He has had increased afib which has been worrisome and exhausting.  It has disrupted his sleep.  ?Pt has felt more lonely.  ?Worked with pt on self esteem issues and his tendency to compare himself to others.  He has a strong inner critic.   Worked on how pt can be  kinder and more compassionate with himself.   ?Helped pt process his thoughts and feelings.   ?Pt's older son and his girlfriend are going to take pt out to dinner this weekend.   ?Pt also has plans with two friends the next few weeks which gives him something to look forward to.    ?Provided supportive therapy. ? ?Interventions: Cognitive Behavioral Therapy and Supportive Therapy ? ?Diagnosis:  F43.23   ? ?Plan:   ?See pt's treatment plan for depression and anxiety in Therapy Charts. (TP target date : 04/01/2022) ?Pt is progressing toward treatment goals.  ?Plan to continue to meet every 2 weeks.   ? ?Romone Shaff, LCSW ? ? ?

## 2022-02-17 ENCOUNTER — Ambulatory Visit (INDEPENDENT_AMBULATORY_CARE_PROVIDER_SITE_OTHER): Payer: Self-pay | Admitting: Psychology

## 2022-02-17 DIAGNOSIS — F4323 Adjustment disorder with mixed anxiety and depressed mood: Secondary | ICD-10-CM

## 2022-02-17 NOTE — Progress Notes (Signed)
[facility] Counselor/Therapist Progress Note ? ?Patient ID: Jonathon Middleton, MRN: [id number],   ? ?Date: 02/17/2022 ? ?Time Spent: 12:00pm-1:00pm   60 minutes ? ?Treatment Type: Individual Therapy ? ?Reported Symptoms: overwhelm, stress ? ?Mental Status Exam: ?Appearance:  Casual     ?Behavior: Appropriate  ?Motor: Normal  ?Speech/Language:  Normal Rate  ?Affect: Appropriate  ?Mood: normal  ?Thought process: normal  ?Thought content:   WNL  ?Sensory/Perceptual disturbances:   WNL  ?Orientation: oriented to person, place, and time/date  ?Attention: Good  ?Concentration: Good  ?Memory: WNL  ?Fund of knowledge:  Good  ?Insight:   Good  ?Judgment:  Good  ?Impulse Control: Good  ? ?Risk Assessment: ?Danger to Self:  No ?Self-injurious Behavior: No ?Danger to Others: No ?Duty to Warn:no ?Physical Aggression / Violence:No  ?Access to Firearms a concern: No  ?Gang Involvement:No  ? ?Subjective: ?Pt present for face-to-face individual therapy via Webex.  Pt consents to telehealth video session due to COVID 19 pandemic.   ?Location of pt: home ?Location of therapist: home office.  ?Pt talked about having a lot going on with work.  Pt was upset about a leadership meeting they had last week.  Pt felt like he was criticized.  Addressed the meeting dynamics.  Helped pt process his thoughts and feelings.   ?Pt talked about concerns about his dog.  He has been sick so pt has a plan to take him to the vet.  Pt's dog is elderly so he is worried about him.   ?Pt talked about a past friend he knew from high school passing away.  Addressed pt's sadness.   ?Pt has been feeling down and has wondered "when things are going to get better".    He asks himself if "it" is really that bad.  Pt states he has an undercurrent of thoughts about where he thinks he should be at this age.  Identified that pt has thought he should be in different places at every stage of his life.   He has historically had trouble embracing where he is  at the time.  Pt states he has put himself in a "self imposed mental prison" bc he is never satisfied with here he is.  Addressed how pt's thoughts about not being good enough affects him.   ?Worked with pt on self esteem issues and his tendency to compare himself to others.  He has a strong inner critic.   Worked on self acceptance and how pt can be kinder and more compassionate with himself.   ?Helped pt process his thoughts and feelings.   ?Provided supportive therapy. ? ?Interventions: Cognitive Behavioral Therapy and Supportive Therapy ? ?Diagnosis:  F43.23   ? ?Plan:   ?See pt's treatment plan for depression and anxiety in Therapy Charts. (TP target date : 04/01/2022) ?Pt is progressing toward treatment goals.  ?Plan to continue to meet every 2 weeks.   ? ?Elexa Kivi, LCSW ? ? ?

## 2022-02-21 ENCOUNTER — Encounter: Payer: Self-pay | Admitting: Family Medicine

## 2022-02-21 ENCOUNTER — Other Ambulatory Visit: Payer: Self-pay

## 2022-02-21 ENCOUNTER — Ambulatory Visit (INDEPENDENT_AMBULATORY_CARE_PROVIDER_SITE_OTHER): Payer: Self-pay | Admitting: Family Medicine

## 2022-02-21 VITALS — BP 134/70 | HR 77 | Temp 97.6°F | Ht 69.0 in | Wt 224.4 lb

## 2022-02-21 DIAGNOSIS — E118 Type 2 diabetes mellitus with unspecified complications: Secondary | ICD-10-CM

## 2022-02-21 DIAGNOSIS — E669 Obesity, unspecified: Secondary | ICD-10-CM

## 2022-02-21 LAB — POCT GLYCOSYLATED HEMOGLOBIN (HGB A1C): Hemoglobin A1C: 6.2 % — AB (ref 4.0–5.6)

## 2022-02-21 NOTE — Progress Notes (Signed)
? ? Patient ID: Jonathon Middleton, male    DOB: Jun 04, 1951, 71 y.o.   MRN: [id number] ? ?This visit was conducted in person. ? ?BP 134/70   Pulse 77   Temp 97.6 ?F (36.4 ?C) (Temporal)   Ht 5\' 9"  (1.753 m)   Wt 224 lb 6 oz (101.8 kg)   SpO2 95%   BMI 33.13 kg/m?   ? ?CC: DM f/u visit  ?Subjective:  ? ?HPI: ?Jonathon Middleton is a 71 y.o. male presenting on 02/21/2022 for Diabetes (Here for f/u.) ? ? ?Wants to use 02/23/2022 online pharmacy  ? ?DM - does regularly check fasting sugars -several times a week - 120-130. Compliant with antihyperglycemic regimen which includes: diet controlled. Last visit we priced out Ozempic - unaffordable, didn't want to try due to possible side effects. He has cut down on alcohol intake. Denies low sugars or hypoglycemic symptoms. Denies paresthesias, blurry vision. Last diabetic eye exam 10/2020 - [facility] - upcoming appt end of March. Glucometer brand: one-touch. Last foot exam: 10/2020 - [facility]. DSME: completed ~2010.  ?Lab Results  ?Component Value Date  ? HGBA1C 6.2 (A) 02/21/2022  ? ?Diabetic Foot Exam - Simple   ?Simple Foot Form ?Diabetic Foot exam was performed with the following findings: Yes 02/21/2022  8:36 AM  ?Visual Inspection ?No deformities, no ulcerations, no other skin breakdown bilaterally: Yes ?Sensation Testing ?Intact to touch and monofilament testing bilaterally: Yes ?Pulse Check ?Posterior Tibialis and Dorsalis pulse intact bilaterally: Yes ?Comments ?  ? ?Lab Results  ?Component Value Date  ? MICROALBUR 0.9 09/29/2017  ?  ? ?Episode of afib late 12/2021 - treated with increased amiodarone dose for 3 days with subsequent resolution.  ?   ? ?Relevant past medical, surgical, family and social history reviewed and updated as indicated. Interim medical history since our last visit reviewed. ?Allergies and medications reviewed and updated. ?Outpatient Medications Prior to Visit  ?Medication Sig Dispense Refill  ? amiodarone (PACERONE) 200 MG tablet TAKE 1 TABLET BY MOUTH  EVERY DAY 90 tablet 0  ? apixaban (ELIQUIS) 5 MG TABS tablet Take 1 tablet (5 mg total) by mouth 2 (two) times daily. 180 tablet 1  ? atorvastatin (LIPITOR) 40 MG tablet TAKE 1 TABLET BY MOUTH EVERYDAY AT BEDTIME 90 tablet 2  ? Cholecalciferol (VITAMIN D) 2000 units CAPS Take 2,000 Units by mouth 2 (two) times daily.     ? Coenzyme Q10 (CO Q-10) 100 MG CAPS Take 100 mg by mouth daily with breakfast.     ? diltiazem (CARDIZEM CD) 120 MG 24 hr capsule TAKE 1 CAPSULE BY MOUTH EVERY DAY 90 capsule 0  ? glucose blood (ONE TOUCH ULTRA TEST) test strip Use to check sugar once daily and as needed. Dx: E11.9 100 each 3  ? Lancets (ONETOUCH ULTRASOFT) lancets USE AS INSTRUCTED 100 each 0  ? losartan-hydrochlorothiazide (HYZAAR) 50-12.5 MG tablet Take 1 tablet by mouth daily. 90 tablet 3  ? metoprolol succinate (TOPROL-XL) 50 MG 24 hr tablet TAKE 1 TABLET BY MOUTH EVERY DAY 90 tablet 2  ? Multiple Vitamin (MULTIVITAMIN WITH MINERALS) TABS tablet Take 1 tablet by mouth daily with breakfast.    ? omega-3 fish oil (MAXEPA) 1000 MG CAPS capsule Take 1 capsule by mouth 2 (two) times daily.     ? sertraline (ZOLOFT) 100 MG tablet TAKE 1 TABLET BY MOUTH EVERY DAY 90 tablet 2  ? TURMERIC PO Take by mouth in the morning and at bedtime.    ? ?No  facility-administered medications prior to visit.  ?  ? ?Per HPI unless specifically indicated in ROS section below ?Review of Systems ? ?Objective:  ?BP 134/70   Pulse 77   Temp 97.6 ?F (36.4 ?C) (Temporal)   Ht 5\' 9"  (1.753 m)   Wt 224 lb 6 oz (101.8 kg)   SpO2 95%   BMI 33.13 kg/m?   ?Wt Readings from Last 3 Encounters:  ?02/21/22 224 lb 6 oz (101.8 kg)  ?06/09/21 221 lb 3 oz (100.3 kg)  ?10/12/20 221 lb 4 oz (100.4 kg)  ?  ?  ?Physical Exam ?Vitals and nursing note reviewed.  ?Constitutional:   ?   Appearance: Normal appearance. He is not ill-appearing.  ?Eyes:  ?   Extraocular Movements: Extraocular movements intact.  ?   Conjunctiva/sclera: Conjunctivae normal.  ?   Pupils: Pupils  are equal, round, and reactive to light.  ?Cardiovascular:  ?   Rate and Rhythm: Normal rate and regular rhythm.  ?   Pulses: Normal pulses.  ?   Heart sounds: Normal heart sounds. No murmur heard. ?Pulmonary:  ?   Effort: Pulmonary effort is normal. No respiratory distress.  ?   Breath sounds: Normal breath sounds. No wheezing, rhonchi or rales.  ?Musculoskeletal:  ?   Right lower leg: No edema.  ?   Left lower leg: No edema.  ?   Comments: See HPI for foot exam if done  ?Skin: ?   General: Skin is warm and dry.  ?   Findings: No rash.  ?Neurological:  ?   Mental Status: He is alert.  ?Psychiatric:     ?   Mood and Affect: Mood normal.     ?   Behavior: Behavior normal.  ? ?   ?Results for orders placed or performed in visit on 02/21/22  ?POCT glycosylated hemoglobin (Hb A1C)  ?Result Value Ref Range  ? Hemoglobin A1C 6.2 (A) 4.0 - 5.6 %  ? HbA1c POC (<> result, manual entry)    ? HbA1c, POC (prediabetic range)    ? HbA1c, POC (controlled diabetic range)    ? ? ?Assessment & Plan:  ?This visit occurred during the SARS-CoV-2 public health emergency.  Safety protocols were in place, including screening questions prior to the visit, additional usage of staff PPE, and extensive cleaning of exam room while observing appropriate contact time as indicated for disinfecting solutions.  ? ?Problem List Items Addressed This Visit   ? ? Controlled diabetes mellitus type 2 with complications (HCC) - Primary  ?  Chronic, diet controlled. Actually great control with improved A1c to prediabetes range - congratulated. Patient is motivated to continue diabetic diet as well as start regular exercise routine. Reassess at CPE in 4 months.  ?  ?  ? Relevant Orders  ? POCT glycosylated hemoglobin (Hb A1C) (Completed)  ? Obesity, Class I, BMI 30-34.9  ?  Encouraged healthy diet and lifestyle choices for sustainable weight loss.  ?  ?  ?  ? ?No orders of the defined types were placed in this encounter. ? ?Orders Placed This Encounter   ?Procedures  ? POCT glycosylated hemoglobin (Hb A1C)  ? ? ? ?Patient Instructions  ?Send 02/23/22 diabetic eye exam when you see Dr [place] at end of this month.  ?You are doing well today!  ?Good to see you today.  ?Incorporate exercise into diet.  ?Return as needed or in 4 months for physical/wellness visit.  ? ?Follow up plan: ?Return in about 4 months (  around 06/23/2022) for annual exam, prior fasting for blood work, medicare wellness visit. ? ?Eustaquio BoydenJavier Amberlea Spagnuolo, MD   ?

## 2022-02-21 NOTE — Assessment & Plan Note (Signed)
Encouraged healthy diet and lifestyle choices for sustainable weight loss ?

## 2022-02-21 NOTE — Patient Instructions (Addendum)
Send [place] diabetic eye exam when you see Dr Dellie Burns at end of this month.  ?You are doing well today!  ?Good to see you today.  ?Incorporate exercise into diet.  ?Return as needed or in 4 months for physical/wellness visit.  ?

## 2022-02-21 NOTE — Assessment & Plan Note (Signed)
Chronic, diet controlled. Actually great control with improved A1c to prediabetes range - congratulated. Patient is motivated to continue diabetic diet as well as start regular exercise routine. Reassess at CPE in 4 months.  ?

## 2022-03-03 ENCOUNTER — Ambulatory Visit (INDEPENDENT_AMBULATORY_CARE_PROVIDER_SITE_OTHER): Payer: Self-pay | Admitting: Psychology

## 2022-03-03 DIAGNOSIS — F4323 Adjustment disorder with mixed anxiety and depressed mood: Secondary | ICD-10-CM

## 2022-03-03 NOTE — Progress Notes (Signed)
[facility] Counselor/Therapist Progress Note ? ?Patient ID: Jonathon Middleton, MRN: [id number],   ? ?Date: 03/03/2022 ? ?Time Spent: 12:00pm-1:00pm   60 minutes ? ?Treatment Type: Individual Therapy ? ?Reported Symptoms: sadness, stress ? ?Mental Status Exam: ?Appearance:  Casual     ?Behavior: Appropriate  ?Motor: Normal  ?Speech/Language:  Normal Rate  ?Affect: Appropriate  ?Mood: normal  ?Thought process: normal  ?Thought content:   WNL  ?Sensory/Perceptual disturbances:   WNL  ?Orientation: oriented to person, place, and time/date  ?Attention: Good  ?Concentration: Good  ?Memory: WNL  ?Fund of knowledge:  Good  ?Insight:   Good  ?Judgment:  Good  ?Impulse Control: Good  ? ?Risk Assessment: ?Danger to Self:  No ?Self-injurious Behavior: No ?Danger to Others: No ?Duty to Warn:no ?Physical Aggression / Violence:No  ?Access to Firearms a concern: No  ?Gang Involvement:No  ? ?Subjective: ?Pt present for face-to-face individual therapy via Webex.  Pt consents to telehealth video session due to COVID 19 pandemic.   ?Location of pt: home ?Location of therapist: home office.  ?Pt talked about having a really good day last Monday.  He had a good check up with his PCP.   He had not felt that good in a long time. ?Pt states his mood has declined since then.   He has felt melancholy.   He was with family this past weekend and was at his exwife's new house and is comparing his situation to theirs.  Pt's son was with them and told him the news of his dog having cancer.   ?Pt is also feeling stress about work.   Pt is overthinking things and putting pressure on himself.  Addressed the stress at work.  Worked on [employer], dispensing.  Addressed how pt can adjust his expectations of himself.   Pt feels like he is never a priority to anyone and that is what is at the root of his current melancholy.   ?Addressed how pt's thoughts about not being good enough affects him.   ?Worked with pt on self esteem issues and his  tendency to compare himself to others.  He has a strong inner critic.   Worked on self acceptance and how pt can be kinder and more compassionate with himself.   ?Pt talked about today being the anniversary of his mother's death in 09-Apr-2004.  He wasn't there when she died and he still feels badly about that.   ?Helped pt process his thoughts and feelings.   ?Provided supportive therapy. ? ?Interventions: Cognitive Behavioral Therapy and Supportive Therapy ? ?Diagnosis:  F43.23   ? ?Plan:   ?See pt's treatment plan for depression and anxiety in Therapy Charts. (TP target date : 04/01/2022) ?Pt is progressing toward treatment goals.  ?Plan to continue to meet every 2 weeks.   ? ?Jae Bruck, LCSW ? ? ? ?

## 2022-03-15 ENCOUNTER — Other Ambulatory Visit: Payer: Self-pay | Admitting: Family Medicine

## 2022-03-17 ENCOUNTER — Ambulatory Visit: Payer: Self-pay | Admitting: Psychology

## 2022-03-31 ENCOUNTER — Ambulatory Visit (INDEPENDENT_AMBULATORY_CARE_PROVIDER_SITE_OTHER): Payer: Self-pay | Admitting: Psychology

## 2022-03-31 DIAGNOSIS — F4323 Adjustment disorder with mixed anxiety and depressed mood: Secondary | ICD-10-CM

## 2022-03-31 NOTE — Progress Notes (Signed)
[facility] Counselor/Therapist Progress Note ? ?Patient ID: Jonathon Middleton, MRN: [id number],   ? ?Date: 03/31/2022 ? ?Time Spent: 12:00pm-1:00pm   60 minutes ? ?Treatment Type: Individual Therapy ? ?Reported Symptoms: sadness, stress ? ?Mental Status Exam: ?Appearance:  Casual     ?Behavior: Appropriate  ?Motor: Normal  ?Speech/Language:  Normal Rate  ?Affect: Appropriate  ?Mood: normal  ?Thought process: normal  ?Thought content:   WNL  ?Sensory/Perceptual disturbances:   WNL  ?Orientation: oriented to person, place, and time/date  ?Attention: Good  ?Concentration: Good  ?Memory: WNL  ?Fund of knowledge:  Good  ?Insight:   Good  ?Judgment:  Good  ?Impulse Control: Good  ? ?Risk Assessment: ?Danger to Self:  No ?Self-injurious Behavior: No ?Danger to Others: No ?Duty to Warn:no ?Physical Aggression / Violence:No  ?Access to Firearms a concern: No  ?Gang Involvement:No  ? ?Subjective: ?Pt present for face-to-face individual therapy via Webex.  Pt consents to telehealth video session due to COVID 19 pandemic.   ?Location of pt: home ?Location of therapist: home office.  ?Pt talked about his trip with his friend [name] who had a stroke in November.  Pt states it was great to see his friend.  They had a good time but pt noticed [name] deficits which was sad for him.   ?Pt has been writing a gratitude list using the alphabet.   He wrote about being grateful for [employer] and [employer], his Boys, Cars, Counseling, Cat, Dog, Friends, Food, Feelings, Gift of Gab. ?Pt talked about having lunch with his oldest son last week.  They had a good connection.   ?Pt talked about work.  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  Pt has a staff meeting today.  The meetings are typically frustrating bc there is not much structure.  Addressed pt's frustrations.  ?Pt talked about aging.  He turns 71 this year.  He thinks about how many more encounters he will be able to have with friends and  loved ones.  Helped pt process his thoughts and feelings.    ?Provided supportive therapy. ? ?Interventions: Cognitive Behavioral Therapy and Supportive Therapy ? ?Diagnosis:  F43.23   ? ?Plan:   ?See pt's treatment plan for depression and anxiety in Therapy Charts. (TP target date : 04/01/2022) ?Pt is progressing toward treatment goals.  ?Plan to continue to meet every 2 weeks.   ? ?Prudencio Velazco, LCSW ? ? ? ?

## 2022-04-04 ENCOUNTER — Other Ambulatory Visit: Payer: Self-pay | Admitting: Family Medicine

## 2022-04-14 ENCOUNTER — Ambulatory Visit (INDEPENDENT_AMBULATORY_CARE_PROVIDER_SITE_OTHER): Payer: Self-pay | Admitting: Psychology

## 2022-04-14 DIAGNOSIS — F4323 Adjustment disorder with mixed anxiety and depressed mood: Secondary | ICD-10-CM

## 2022-04-14 NOTE — Progress Notes (Signed)
[facility] Counselor Initial Adult Exam ? ?Name: Jonathon Middleton ?Date: 04/14/2022 ?MRN: [id number] ?DOB: 10-May-1951 ?PCP: Eustaquio Boyden, MD ? ?Time spent: 12:00pm-12:55pm   55 minutes ? ?Guardian/Payee:  n/a   ? ?Paperwork requested: No  ? ?Reason for Visit /Presenting Problem:  ?Pt present for face-to-face individual therapy via video Webex.  Pt consents to telehealth video session due to COVID 19 pandemic. ?Location of pt: home ?Location of therapist: home office.  ?Pt has issues of negative self talk and low self esteem.  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  ?Pt has issues with perfectionism and codependency.  ?Reviewed pt's treatment plan for annual update.   Pt participated in setting treatment goals.   Pt wants to continue to work on self esteem and his feelings of "not feeling good enough."  Pt wants to work on self doubt and issues of perfectionism.  Plan to continue to meet every two weeks.    ? ?Mental Status Exam: ?Appearance:   Casual     ?Behavior:  Appropriate  ?Motor:  Normal  ?Speech/Language:   Normal Rate  ?Affect:  Appropriate  ?Mood:  normal  ?Thought process:  normal  ?Thought content:    WNL  ?Sensory/Perceptual disturbances:    WNL  ?Orientation:  oriented to person, place, time/date, and situation  ?Attention:  Good  ?Concentration:  Good  ?Memory:  WNL  ?Fund of knowledge:   Good  ?Insight:    Good  ?Judgment:   Good  ?Impulse Control:  Good  ? ? ?Reported Symptoms:  stress, loneliness ? ?Risk Assessment: ?Danger to Self:  No ?Self-injurious Behavior: No ?Danger to Others: No ?Duty to Warn:no ?Physical Aggression / Violence:No  ?Access to Firearms a concern: No  ?Gang Involvement:No  ?Patient / guardian was educated about steps to take if suicide or homicide risk level increases between visits: n/a ?While future psychiatric events cannot be accurately predicted, the patient does not currently require acute inpatient psychiatric care and does not currently meet  [facility] involuntary commitment criteria. ? ?Substance Abuse History: ?Current substance abuse: No    ? ?Past Psychiatric History:   ?Previous psychological history is significant for depression ?Outpatient Providers:pt has been in therapy in the past. ?History of Psych Hospitalization: No  ?Psychological Testing:  n/a   ? ?Abuse History:  ?Victim of: No.,  n/a    ?Report needed: No. ?Victim of Neglect:No. ?Perpetrator of  n/a   ?Witness / Exposure to Domestic Violence: No   ?Protective Services Involvement: No  ?Witness to [employer] Violence:  No  ? ?Family History:  ?Family History  ?Problem Relation Age of Onset  ? Hypertension Mother   ? CAD Father 63  ?     massive MI, deceased  ? CAD Paternal Uncle 8  ?     MI, smoker  ? Stroke Paternal Aunt   ? Diabetes Paternal Grandmother   ? Cancer Maternal Uncle   ?     lung cancer (non small cell), smoker  ? ? ?Living situation: the patient lives alone ? ?Pt's father died when he was 64.  Father had massive heart attack and died suddenly at age 88.   Mother never remarried.  Pt was only child.  ?Pt did not attend funeral.  Pt does not know how he grieved. ?Pt was very involved in a church that was supportive.  Pt played guitar.   ?No family history of mental illness.  ? ?Sexual Orientation: Straight ? ?Relationship Status: divorced  ?  Name of spouse / other:n/a ?If a parent, number of children / ages:  pt has 2 adult sons.  ? ?Support Systems: friends ? ?Financial Stress:  No  ? 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  He has put all his efforts into his job now.  Pt has been at this job for 6 years.    ?Pt has college degree.   He has worked in Airline pilotsales.  ? ?Military Service: No  ? ?Educational History: ?Education: college graduate ? ?Religion/Sprituality/World View: ?Protestant ? ?Any cultural differences that may affect / interfere with treatment:  not applicable  ? ?Recreation/Hobbies:  gardening, reading, fishing ? ?Stressors: Other: job stress, loneliness at times.   ? ?Strengths: Supportive Relationships, Family, Hopefulness, Self Advocate, and Able to Communicate Effectively ? ?Barriers:  none  ? ?Legal History: ?Pending legal issue / charges: The patient has no significant history of legal issues. ?History of legal issue / charges:  n/a ? ?Medical History/Surgical History: reviewed ?Past Medical History:  ?Diagnosis Date  ? Arthritis   ? Atrial fibrillation (HCC)   ? Depression   ? Depression with anxiety   ? GERD (gastroesophageal reflux disease)   ? 08/24/18- not current  ? History of chicken pox   ? Humerus fracture   ? x 2  ? Hyperlipidemia   ? Hypertension   ? Nonalcoholic fatty liver disease   ? ?h/o elevated LFTs, has not had US  ? Pre-diabetes   ? Metobolic Syndrome  ? Prediabetes   ? treated with diet & exercise. saw [facility] nutritionist 04/2014  ? ? ?Past Surgical History:  ?Procedure Laterality Date  ? COLONOSCOPY    ? HARDWARE REMOVAL Right 09/21/2017  ? Procedure: HARDWARE REMOVAL OF RIGHT HUMERUS;  Surgeon: Tarry KosXu, Naiping M, MD;  Location: [facility] OR;  Service: Orthopedics;  Laterality: Right;  ? KNEE SURGERY Right 2009  ? x 2 (Dr. Ernest PineHooten)  Arthroscopy- torn MCL  ? ORIF HUMERUS FRACTURE Right 08/25/2017  ? Procedure: OPEN REDUCTION INTERNAL FIXATION (ORIF) RIGHT HUMERAL SHAFT FRACTURE;  Surgeon: Tarry KosXu, Naiping M, MD;  Location: [facility] OR;  Service: Orthopedics;  Laterality: Right;  ? ORIF HUMERUS FRACTURE Right 09/21/2017  ? Procedure: OPEN REDUCTION INTERNAL FIXATION (ORIF) RIGHT HUMERAL SHAFT FRACTURE;  Surgeon: Tarry KosXu, Naiping M, MD;  Location: [facility] OR;  Service: Orthopedics;  Laterality: Right;  ? ? ?Medications: ?Current Outpatient Medications  ?Medication Sig Dispense Refill  ? amiodarone (PACERONE) 200 MG tablet TAKE 1 TABLET BY MOUTH EVERY DAY 90 tablet 0  ? apixaban (ELIQUIS) 5 MG TABS tablet Take 1 tablet (5 mg total) by mouth 2 (two) times daily. 180 tablet 1  ? atorvastatin (LIPITOR) 40 MG  tablet TAKE 1 TABLET BY MOUTH EVERYDAY AT BEDTIME 90 tablet 2  ? Cholecalciferol (VITAMIN D) 2000 units CAPS Take 2,000 Units by mouth 2 (two) times daily.     ? Coenzyme Q10 (CO Q-10) 100 MG CAPS Take 100 mg by mouth daily with breakfast.     ? diltiazem (CARDIZEM CD) 120 MG 24 hr capsule TAKE 1 CAPSULE BY MOUTH EVERY DAY 90 capsule 0  ? glucose blood (ONE TOUCH ULTRA TEST) test strip Use to check sugar once daily and as needed. Dx: E11.9 100 each 3  ? Lancets (ONETOUCH ULTRASOFT) lancets USE AS INSTRUCTED 100 each 0  ? losartan-hydrochlorothiazide (HYZAAR) 50-12.5 MG tablet Take 1 tablet by mouth daily. 90 tablet 3  ? metoprolol succinate (TOPROL-XL) 50 MG 24 hr tablet  TAKE 1 TABLET BY MOUTH EVERY DAY 90 tablet 2  ? Multiple Vitamin (MULTIVITAMIN WITH MINERALS) TABS tablet Take 1 tablet by mouth daily with breakfast.    ? omega-3 fish oil (MAXEPA) 1000 MG CAPS capsule Take 1 capsule by mouth 2 (two) times daily.     ? sertraline (ZOLOFT) 100 MG tablet TAKE 1 TABLET BY MOUTH EVERY DAY 90 tablet 2  ? TURMERIC PO Take by mouth in the morning and at bedtime.    ? ?No current facility-administered medications for this visit.  ? ? ?No Known Allergies ? ?Diagnoses:  ?F43.23 ? ?Plan of Care: Recommend ongoing therapy.  Pt participated in setting treatment goals.  Plan to continue to meet every two weeks.  ? ?Treatment Plan ?Client Abilities/Strengths  ?Pt is bright, engaging, and motivated for therapy.  ?Client Treatment Preferences  ?Individual therapy.  ?Client Statement of Needs  ?Improve copings skills and understand herself better. Improve self esteem.  ?Symptoms  ?Depressed or irritable mood. Excessive and/or unrealistic worry that is difficult to control occurring more days than not for at least 6 months about a number of events or activities. Hypervigilance (e.g., feeling constantly on edge, experiencing concentration difficulties, having trouble falling or staying asleep, exhibiting a general state of  irritability). Low self-esteem. ?Problems Addressed  ?Unipolar Depression, Anxiety ?Goals ?1. Alleviate depressive symptoms and return to previous level of effective functioning. ?2. Appropriately grieve the loss in order t

## 2022-04-28 ENCOUNTER — Ambulatory Visit (INDEPENDENT_AMBULATORY_CARE_PROVIDER_SITE_OTHER): Payer: Self-pay | Admitting: Psychology

## 2022-04-28 DIAGNOSIS — F4323 Adjustment disorder with mixed anxiety and depressed mood: Secondary | ICD-10-CM

## 2022-04-28 NOTE — Progress Notes (Signed)
[facility] Counselor/Therapist Progress Note  Patient ID: Jonathon Middleton, MRN: [id number],    Date: 04/28/2022  Time Spent: 12:00pm - 12:55pm    55 minutes   Treatment Type: Individual Therapy  Reported Symptoms: stress  Mental Status Exam: Appearance:  Casual     Behavior: Appropriate  Motor: Normal  Speech/Language:  Normal Rate  Affect: Appropriate  Mood: normal  Thought process: normal  Thought content:   WNL  Sensory/Perceptual disturbances:   WNL  Orientation: oriented to person, place, time/date, and situation  Attention: Good  Concentration: Good  Memory: WNL  Fund of knowledge:  Good  Insight:   Good  Judgment:  Good  Impulse Control: Good   Risk Assessment: Danger to Self:  No Self-injurious Behavior: No Danger to Others: No Duty to Warn:no Physical Aggression / Violence:No  Access to Firearms a concern: No  Gang Involvement:No   Subjective: Pt present for face-to-face individual therapy via video Webex.  Pt consents to telehealth video session due to COVID 19 pandemic. Location of pt: home Location of therapist: home office.   Pt talked about his sons.   The girlfriend of one of pt's sons talked with pt and asked him to not tell his son.  She asked pt for advice and help bc she sees him as a father figure.  Pt agreed to keep her confidence and help her out.   He now feels uncomfortable with keeping it from his son.  Addressed how pt can encourage the girlfriend to share with pt's son.   Addressed the relationship dynamics.   Pt talked about his dog passing away recently.  Addressed pt's grief and helped him process his feelings.   Pt talked about work.  He had been having some clients that are working out well.   Pt has felt better the past couple of weeks bc he has been focusing on gratitude.  He is working on living in the moment.   Provided supportive therapy.    Interventions: Cognitive Behavioral Therapy and  Insight-Oriented  Diagnosis:  F43.23  Plan: Plan of Care: Recommend ongoing therapy.  Pt participated in setting treatment goals.  Plan to continue to meet every two weeks.   Treatment Plan Client Abilities/Strengths  Pt is bright, engaging, and motivated for therapy.  Client Treatment Preferences  Individual therapy.  Client Statement of Needs  Improve copings skills and understand herself better. Improve self esteem.  Symptoms  Depressed or irritable mood. Excessive and/or unrealistic worry that is difficult to control occurring more days than not for at least 6 months about a number of events or activities. Hypervigilance (e.g., feeling constantly on edge, experiencing concentration difficulties, having trouble falling or staying asleep, exhibiting a general state of irritability). Low self-esteem. Problems Addressed  Unipolar Depression, Anxiety Goals 1. Alleviate depressive symptoms and return to previous level of effective functioning. 2. Appropriately grieve the loss in order to normalize mood and to return to previously adaptive level of functioning. Objective Learn and implement behavioral strategies to overcome depression. Target Date: 2023-04-15 Frequency: Biweekly  Progress: 60 Modality: individual  Related Interventions Assist the client in developing skills that increase the likelihood of deriving pleasure from behavioral activation (e.g., assertiveness skills, developing an exercise plan, less internal/more external focus, increased social involvement); reinforce success. Engage the client in "behavioral activation," increasing his/her activity level and contact with sources of reward, while identifying processes that inhibit activation. use behavioral techniques such as instruction, rehearsal, role-playing, role reversal, as needed, to facilitate  activity in the client's daily life; reinforce success. 3. Develop healthy interpersonal relationships that lead to the  alleviation and help prevent the relapse of depression. 4. Develop healthy thinking patterns and beliefs about self, others, and the world that lead to the alleviation and help prevent the relapse of depression. 5. Enhance ability to effectively cope with the full variety of life's worries and anxieties. 6. Learn and implement coping skills that result in a reduction of anxiety and worry, and improved daily functioning. Objective Learn and implement problem-solving strategies for realistically addressing worries. Target Date: 2023-04-15 Frequency: Biweekly  Progress: 60 Modality: individual  Related Interventions Assign the client a homework exercise in which he/she problem-solves a current problem (see Mastery of Your Anxiety and Worry: Workbook by Elenora Fender and Filbert Schilder or Generalized Anxiety Disorder by Elesa Hacker, and Filbert Schilder); review, reinforce success, and provide corrective feedback toward improvement. Teach the client problem-solving strategies involving specifically defining a problem, generating options for addressing it, evaluating the pros and cons of each option, selecting and implementing an optional action, and reevaluating and refining the action. Objective Learn and implement calming skills to reduce overall anxiety and manage anxiety symptoms. Target Date: 2023-04-15 Frequency: Biweekly  Progress: 60 Modality: individual  Related Interventions Assign the client to read about progressive muscle relaxation and other calming strategies in relevant books or treatment manuals (e.g., Progressive Relaxation Training by Twana First; Mastery of Your Anxiety and Worry: Workbook by Earlie Counts). Assign the client homework each session in which he/she practices relaxation exercises daily, gradually applying them progressively from non-anxiety-provoking to anxiety-provoking situations; review and reinforce success while providing corrective feedback toward improvement. Teach the  client calming/relaxation skills (e.g., applied relaxation, progressive muscle relaxation, cue controlled relaxation; mindful breathing; biofeedback) and how to discriminate better between relaxation and tension; teach the client how to apply these skills to his/her daily life. 7. Recognize, accept, and cope with feelings of depression. 8. Reduce overall frequency, intensity, and duration of the anxiety so that daily functioning is not impaired. 9. Resolve the core conflict that is the source of anxiety. 10. Stabilize anxiety level while increasing ability to function on a daily basis. Diagnosis F43.23  Conditions For Discharge Achievement of treatment goals and objectives   Salomon Fick, LCSW

## 2022-05-09 ENCOUNTER — Other Ambulatory Visit: Payer: Self-pay | Admitting: Family Medicine

## 2022-05-12 ENCOUNTER — Ambulatory Visit (INDEPENDENT_AMBULATORY_CARE_PROVIDER_SITE_OTHER): Payer: Self-pay | Admitting: Psychology

## 2022-05-12 DIAGNOSIS — F4323 Adjustment disorder with mixed anxiety and depressed mood: Secondary | ICD-10-CM

## 2022-05-12 NOTE — Progress Notes (Signed)
[facility] Counselor/Therapist Progress Note  Patient ID: Matthias Bogus, MRN: [id number],    Date: 05/12/2022  Time Spent: 12:00pm - 12:55pm    55 minutes   Treatment Type: Individual Therapy  Reported Symptoms: stress  Mental Status Exam: Appearance:  Casual     Behavior: Appropriate  Motor: Normal  Speech/Language:  Normal Rate  Affect: Appropriate  Mood: normal  Thought process: normal  Thought content:   WNL  Sensory/Perceptual disturbances:   WNL  Orientation: oriented to person, place, time/date, and situation  Attention: Good  Concentration: Good  Memory: WNL  Fund of knowledge:  Good  Insight:   Good  Judgment:  Good  Impulse Control: Good   Risk Assessment: Danger to Self:  No Self-injurious Behavior: No Danger to Others: No Duty to Warn:no Physical Aggression / Violence:No  Access to Firearms a concern: No  Gang Involvement:No   Subjective: Pt present for face-to-face individual therapy via video Webex.  Pt consents to telehealth video session due to COVID 19 pandemic. Location of pt: home Location of therapist: home office.   Pt talked about work.   He has closed on a deal that he is happy about.  Pt has other deals he is working on.    Pt talked about his oldest son who called him yesterday to talk with him about a work Research officer, trade union.  Pt is having dinner with his youngest son and his children tonight. Pt states he has been working on perfectionism the past couple of weeks.  He has been thinking about where it comes from in him and he keeps thinking back to his childhood.  Pt is thinking about the "why" of perfectionism and how his perfectionist behavior is used to mask his low self esteem and codependency issues.  Helped pt process the issues.   Pt has been trying to monitor his perfectionist behavior and adjust himself by taking "baby steps".   Pt states his gratitude work is helping him have a Sport and exercise psychologist.   Provided supportive  therapy.    Interventions: Cognitive Behavioral Therapy and Insight-Oriented  Diagnosis:  F43.23  Plan: Plan of Care: Recommend ongoing therapy.  Pt participated in setting treatment goals.  Plan to continue to meet every two weeks.   Treatment Plan Client Abilities/Strengths  Pt is bright, engaging, and motivated for therapy.  Client Treatment Preferences  Individual therapy.  Client Statement of Needs  Improve copings skills and understand herself better. Improve self esteem.  Symptoms  Depressed or irritable mood. Excessive and/or unrealistic worry that is difficult to control occurring more days than not for at least 6 months about a number of events or activities. Hypervigilance (e.g., feeling constantly on edge, experiencing concentration difficulties, having trouble falling or staying asleep, exhibiting a general state of irritability). Low self-esteem. Problems Addressed  Unipolar Depression, Anxiety Goals 1. Alleviate depressive symptoms and return to previous level of effective functioning. 2. Appropriately grieve the loss in order to normalize mood and to return to previously adaptive level of functioning. Objective Learn and implement behavioral strategies to overcome depression. Target Date: 2023-04-15 Frequency: Biweekly  Progress: 60 Modality: individual  Related Interventions Assist the client in developing skills that increase the likelihood of deriving pleasure from behavioral activation (e.g., assertiveness skills, developing an exercise plan, less internal/more external focus, increased social involvement); reinforce success. Engage the client in "behavioral activation," increasing his/her activity level and contact with sources of reward, while identifying processes that inhibit activation. use behavioral techniques such as instruction,  rehearsal, role-playing, role reversal, as needed, to facilitate activity in the client's daily life; reinforce success. 3. Develop  healthy interpersonal relationships that lead to the alleviation and help prevent the relapse of depression. 4. Develop healthy thinking patterns and beliefs about self, others, and the world that lead to the alleviation and help prevent the relapse of depression. 5. Enhance ability to effectively cope with the full variety of life's worries and anxieties. 6. Learn and implement coping skills that result in a reduction of anxiety and worry, and improved daily functioning. Objective Learn and implement problem-solving strategies for realistically addressing worries. Target Date: 2023-04-15 Frequency: Biweekly  Progress: 60 Modality: individual  Related Interventions Assign the client a homework exercise in which he/she problem-solves a current problem (see Mastery of Your Anxiety and Worry: Workbook by Elenora Fender and Filbert Schilder or Generalized Anxiety Disorder by Elesa Hacker, and Filbert Schilder); review, reinforce success, and provide corrective feedback toward improvement. Teach the client problem-solving strategies involving specifically defining a problem, generating options for addressing it, evaluating the pros and cons of each option, selecting and implementing an optional action, and reevaluating and refining the action. Objective Learn and implement calming skills to reduce overall anxiety and manage anxiety symptoms. Target Date: 2023-04-15 Frequency: Biweekly  Progress: 60 Modality: individual  Related Interventions Assign the client to read about progressive muscle relaxation and other calming strategies in relevant books or treatment manuals (e.g., Progressive Relaxation Training by Twana First; Mastery of Your Anxiety and Worry: Workbook by Earlie Counts). Assign the client homework each session in which he/she practices relaxation exercises daily, gradually applying them progressively from non-anxiety-provoking to anxiety-provoking situations; review and reinforce success while  providing corrective feedback toward improvement. Teach the client calming/relaxation skills (e.g., applied relaxation, progressive muscle relaxation, cue controlled relaxation; mindful breathing; biofeedback) and how to discriminate better between relaxation and tension; teach the client how to apply these skills to his/her daily life. 7. Recognize, accept, and cope with feelings of depression. 8. Reduce overall frequency, intensity, and duration of the anxiety so that daily functioning is not impaired. 9. Resolve the core conflict that is the source of anxiety. 10. Stabilize anxiety level while increasing ability to function on a daily basis. Diagnosis F43.23  Conditions For Discharge Achievement of treatment goals and objectives   Salomon Fick, LCSW

## 2022-05-26 ENCOUNTER — Ambulatory Visit (INDEPENDENT_AMBULATORY_CARE_PROVIDER_SITE_OTHER): Payer: Self-pay | Admitting: Psychology

## 2022-05-26 DIAGNOSIS — F4323 Adjustment disorder with mixed anxiety and depressed mood: Secondary | ICD-10-CM

## 2022-05-26 NOTE — Progress Notes (Signed)
[facility] Counselor/Therapist Progress Note  Patient ID: Jonathon Middleton, MRN: [id number],    Date: 05/26/2022  Time Spent: 12:00pm - 12:55pm    55 minutes   Treatment Type: Individual Therapy  Reported Symptoms: stress  Mental Status Exam: Appearance:  Casual     Behavior: Appropriate  Motor: Normal  Speech/Language:  Normal Rate  Affect: Appropriate  Mood: normal  Thought process: normal  Thought content:   WNL  Sensory/Perceptual disturbances:   WNL  Orientation: oriented to person, place, time/date, and situation  Attention: Good  Concentration: Good  Memory: WNL  Fund of knowledge:  Good  Insight:   Good  Judgment:  Good  Impulse Control: Good   Risk Assessment: Danger to Self:  No Self-injurious Behavior: No Danger to Others: No Duty to Warn:no Physical Aggression / Violence:No  Access to Firearms a concern: No  Gang Involvement:No   Subjective: Pt present for face-to-face individual therapy via video Webex.  Pt consents to telehealth video session due to COVID 19 pandemic. Location of pt: home Location of therapist: home office.   Pt talked about his cat dying.  Helped pt process his feelings and grief.   Pt talked about father's day.  He celebrated it with his sons and had a nice time.   Pt has been listening to some podcasts that have been motivational for him.   He has been thinking about the story we tell ourselves about who we are.   Pt talked about work.  Work has been going very well.  There have been some challenges though bc it is a very tough job market.   Pt states his gratitude work is helping him have a Sport and exercise psychologist.   Provided supportive therapy.    Interventions: Cognitive Behavioral Therapy and Insight-Oriented  Diagnosis:  F43.23  Plan: Plan of Care: Recommend ongoing therapy.  Pt participated in setting treatment goals.  Plan to continue to meet every two weeks.   Treatment Plan Client Abilities/Strengths  Pt is  bright, engaging, and motivated for therapy.  Client Treatment Preferences  Individual therapy.  Client Statement of Needs  Improve copings skills and understand herself better. Improve self esteem.  Symptoms  Depressed or irritable mood. Excessive and/or unrealistic worry that is difficult to control occurring more days than not for at least 6 months about a number of events or activities. Hypervigilance (e.g., feeling constantly on edge, experiencing concentration difficulties, having trouble falling or staying asleep, exhibiting a general state of irritability). Low self-esteem. Problems Addressed  Unipolar Depression, Anxiety Goals 1. Alleviate depressive symptoms and return to previous level of effective functioning. 2. Appropriately grieve the loss in order to normalize mood and to return to previously adaptive level of functioning. Objective Learn and implement behavioral strategies to overcome depression. Target Date: 2023-04-15 Frequency: Biweekly  Progress: 60 Modality: individual  Related Interventions Assist the client in developing skills that increase the likelihood of deriving pleasure from behavioral activation (e.g., assertiveness skills, developing an exercise plan, less internal/more external focus, increased social involvement); reinforce success. Engage the client in "behavioral activation," increasing his/her activity level and contact with sources of reward, while identifying processes that inhibit activation. use behavioral techniques such as instruction, rehearsal, role-playing, role reversal, as needed, to facilitate activity in the client's daily life; reinforce success. 3. Develop healthy interpersonal relationships that lead to the alleviation and help prevent the relapse of depression. 4. Develop healthy thinking patterns and beliefs about self, others, and the world that lead to the  alleviation and help prevent the relapse of depression. 5. Enhance ability to  effectively cope with the full variety of life's worries and anxieties. 6. Learn and implement coping skills that result in a reduction of anxiety and worry, and improved daily functioning. Objective Learn and implement problem-solving strategies for realistically addressing worries. Target Date: 2023-04-15 Frequency: Biweekly  Progress: 60 Modality: individual  Related Interventions Assign the client a homework exercise in which he/she problem-solves a current problem (see Mastery of Your Anxiety and Worry: Workbook by Elenora Fender and Filbert Schilder or Generalized Anxiety Disorder by Elesa Hacker, and Filbert Schilder); review, reinforce success, and provide corrective feedback toward improvement. Teach the client problem-solving strategies involving specifically defining a problem, generating options for addressing it, evaluating the pros and cons of each option, selecting and implementing an optional action, and reevaluating and refining the action. Objective Learn and implement calming skills to reduce overall anxiety and manage anxiety symptoms. Target Date: 2023-04-15 Frequency: Biweekly  Progress: 60 Modality: individual  Related Interventions Assign the client to read about progressive muscle relaxation and other calming strategies in relevant books or treatment manuals (e.g., Progressive Relaxation Training by Twana First; Mastery of Your Anxiety and Worry: Workbook by Earlie Counts). Assign the client homework each session in which he/she practices relaxation exercises daily, gradually applying them progressively from non-anxiety-provoking to anxiety-provoking situations; review and reinforce success while providing corrective feedback toward improvement. Teach the client calming/relaxation skills (e.g., applied relaxation, progressive muscle relaxation, cue controlled relaxation; mindful breathing; biofeedback) and how to discriminate better between relaxation and tension; teach the client how  to apply these skills to his/her daily life. 7. Recognize, accept, and cope with feelings of depression. 8. Reduce overall frequency, intensity, and duration of the anxiety so that daily functioning is not impaired. 9. Resolve the core conflict that is the source of anxiety. 10. Stabilize anxiety level while increasing ability to function on a daily basis. Diagnosis F43.23  Conditions For Discharge Achievement of treatment goals and objectives   Salomon Fick, LCSW

## 2022-06-09 ENCOUNTER — Ambulatory Visit (INDEPENDENT_AMBULATORY_CARE_PROVIDER_SITE_OTHER): Payer: Self-pay | Admitting: Psychology

## 2022-06-09 DIAGNOSIS — F4323 Adjustment disorder with mixed anxiety and depressed mood: Secondary | ICD-10-CM

## 2022-06-09 NOTE — Progress Notes (Signed)
[facility] Counselor/Therapist Progress Note  Patient ID: Jonathon Middleton, MRN: [id number],    Date: 06/09/2022  Time Spent: 12:00pm - 12:55pm    55 minutes   Treatment Type: Individual Therapy  Reported Symptoms: stress  Mental Status Exam: Appearance:  Casual     Behavior: Appropriate  Motor: Normal  Speech/Language:  Normal Rate  Affect: Appropriate  Mood: normal  Thought process: normal  Thought content:   WNL  Sensory/Perceptual disturbances:   WNL  Orientation: oriented to person, place, time/date, and situation  Attention: Good  Concentration: Good  Memory: WNL  Fund of knowledge:  Good  Insight:   Good  Judgment:  Good  Impulse Control: Good   Risk Assessment: Danger to Self:  No Self-injurious Behavior: No Danger to Others: No Duty to Warn:no Physical Aggression / Violence:No  Access to Firearms a concern: No  Gang Involvement:No   Subjective: Pt present for face-to-face individual therapy via video Webex.  Pt consents to telehealth video session due to COVID 19 pandemic. Location of pt: home Location of therapist: home office.   Pt talked about journaling.  He has been working on and writing about his identity.  Pt has been thinking about the scripts in his head that impact what he thinks about himself and impact his identity.  "Be interesting not perfect."  "There is never a perfect time.  Do it now and do it scared." Pt states he wants to begin to develop a path of how to define himself to others.   Worked with pt on identifying traits that he sees in himself.  He sees himself as Biochemist, clinical, professional, funny, passionate, caring, interesting, perfectionist, squirrelly.  Pt has a genuine interest in people.   Pt "wears his heart on his sleeve" and gets hurt easily.   Addressed how pt can change his language around identity to state "I am"   rather than " I want to be".   Provided supportive therapy.    Interventions: Cognitive Behavioral  Therapy and Insight-Oriented  Diagnosis:  F43.23  Plan: Plan of Care: Recommend ongoing therapy.  Pt participated in setting treatment goals.  Plan to continue to meet every two weeks.   Treatment Plan Client Abilities/Strengths  Pt is bright, engaging, and motivated for therapy.  Client Treatment Preferences  Individual therapy.  Client Statement of Needs  Improve copings skills and understand herself better. Improve self esteem.  Symptoms  Depressed or irritable mood. Excessive and/or unrealistic worry that is difficult to control occurring more days than not for at least 6 months about a number of events or activities. Hypervigilance (e.g., feeling constantly on edge, experiencing concentration difficulties, having trouble falling or staying asleep, exhibiting a general state of irritability). Low self-esteem. Problems Addressed  Unipolar Depression, Anxiety Goals 1. Alleviate depressive symptoms and return to previous level of effective functioning. 2. Appropriately grieve the loss in order to normalize mood and to return to previously adaptive level of functioning. Objective Learn and implement behavioral strategies to overcome depression. Target Date: 2023-04-15 Frequency: Biweekly  Progress: 60 Modality: individual  Related Interventions Assist the client in developing skills that increase the likelihood of deriving pleasure from behavioral activation (e.g., assertiveness skills, developing an exercise plan, less internal/more external focus, increased social involvement); reinforce success. Engage the client in "behavioral activation," increasing his/her activity level and contact with sources of reward, while identifying processes that inhibit activation. use behavioral techniques such as instruction, rehearsal, role-playing, role reversal, as needed, to facilitate activity in the  client's daily life; reinforce success. 3. Develop healthy interpersonal relationships that lead to  the alleviation and help prevent the relapse of depression. 4. Develop healthy thinking patterns and beliefs about self, others, and the world that lead to the alleviation and help prevent the relapse of depression. 5. Enhance ability to effectively cope with the full variety of life's worries and anxieties. 6. Learn and implement coping skills that result in a reduction of anxiety and worry, and improved daily functioning. Objective Learn and implement problem-solving strategies for realistically addressing worries. Target Date: 2023-04-15 Frequency: Biweekly  Progress: 60 Modality: individual  Related Interventions Assign the client a homework exercise in which he/she problem-solves a current problem (see Mastery of Your Anxiety and Worry: Workbook by Elenora Fender and Filbert Schilder or Generalized Anxiety Disorder by Elesa Hacker, and Filbert Schilder); review, reinforce success, and provide corrective feedback toward improvement. Teach the client problem-solving strategies involving specifically defining a problem, generating options for addressing it, evaluating the pros and cons of each option, selecting and implementing an optional action, and reevaluating and refining the action. Objective Learn and implement calming skills to reduce overall anxiety and manage anxiety symptoms. Target Date: 2023-04-15 Frequency: Biweekly  Progress: 60 Modality: individual  Related Interventions Assign the client to read about progressive muscle relaxation and other calming strategies in relevant books or treatment manuals (e.g., Progressive Relaxation Training by Twana First; Mastery of Your Anxiety and Worry: Workbook by Earlie Counts). Assign the client homework each session in which he/she practices relaxation exercises daily, gradually applying them progressively from non-anxiety-provoking to anxiety-provoking situations; review and reinforce success while providing corrective feedback toward improvement. Teach  the client calming/relaxation skills (e.g., applied relaxation, progressive muscle relaxation, cue controlled relaxation; mindful breathing; biofeedback) and how to discriminate better between relaxation and tension; teach the client how to apply these skills to his/her daily life. 7. Recognize, accept, and cope with feelings of depression. 8. Reduce overall frequency, intensity, and duration of the anxiety so that daily functioning is not impaired. 9. Resolve the core conflict that is the source of anxiety. 10. Stabilize anxiety level while increasing ability to function on a daily basis. Diagnosis F43.23  Conditions For Discharge Achievement of treatment goals and objectives   Salomon Fick, LCSW

## 2022-06-11 ENCOUNTER — Other Ambulatory Visit: Payer: Self-pay | Admitting: Family Medicine

## 2022-06-11 DIAGNOSIS — E1169 Type 2 diabetes mellitus with other specified complication: Secondary | ICD-10-CM

## 2022-06-11 DIAGNOSIS — I480 Paroxysmal atrial fibrillation: Secondary | ICD-10-CM

## 2022-06-11 DIAGNOSIS — R972 Elevated prostate specific antigen [PSA]: Secondary | ICD-10-CM

## 2022-06-11 DIAGNOSIS — E118 Type 2 diabetes mellitus with unspecified complications: Secondary | ICD-10-CM

## 2022-06-16 ENCOUNTER — Other Ambulatory Visit (INDEPENDENT_AMBULATORY_CARE_PROVIDER_SITE_OTHER): Payer: Self-pay

## 2022-06-16 DIAGNOSIS — I480 Paroxysmal atrial fibrillation: Secondary | ICD-10-CM

## 2022-06-16 DIAGNOSIS — R972 Elevated prostate specific antigen [PSA]: Secondary | ICD-10-CM

## 2022-06-16 DIAGNOSIS — E118 Type 2 diabetes mellitus with unspecified complications: Secondary | ICD-10-CM

## 2022-06-16 DIAGNOSIS — E785 Hyperlipidemia, unspecified: Secondary | ICD-10-CM

## 2022-06-16 DIAGNOSIS — E1169 Type 2 diabetes mellitus with other specified complication: Secondary | ICD-10-CM

## 2022-06-16 LAB — MICROALBUMIN / CREATININE URINE RATIO
Creatinine,U: 157.3 mg/dL
Microalb Creat Ratio: 0.4 mg/g (ref 0.0–30.0)
Microalb, Ur: <0.7 mg/dL (ref 0.0–1.9)

## 2022-06-16 LAB — COMPREHENSIVE METABOLIC PANEL
ALT: 36 U/L (ref 0–53)
AST: 25 U/L (ref 0–37)
Albumin: 4.3 g/dL (ref 3.5–5.2)
Alkaline Phosphatase: 85 U/L (ref 39–117)
BUN: 31 mg/dL — ABNORMAL HIGH (ref 6–23)
CO2: 29 mEq/L (ref 19–32)
Calcium: 9.1 mg/dL (ref 8.4–10.5)
Chloride: 105 mEq/L (ref 96–112)
Creatinine, Ser: 1.26 mg/dL (ref 0.40–1.50)
GFR: 58.12 mL/min — ABNORMAL LOW (ref >60.00)
Glucose, Bld: 111 mg/dL — ABNORMAL HIGH (ref 70–99)
Potassium: 4.3 mEq/L (ref 3.5–5.1)
Sodium: 142 mEq/L (ref 135–145)
Total Bilirubin: 0.5 mg/dL (ref 0.2–1.2)
Total Protein: 6.5 g/dL (ref 6.0–8.3)

## 2022-06-16 LAB — LIPID PANEL
Cholesterol: 159 mg/dL (ref 0–200)
HDL: 45.1 mg/dL (ref >39.00)
LDL Cholesterol: 84 mg/dL (ref 0–99)
NonHDL: 114.03
Total CHOL/HDL Ratio: 4
Triglycerides: 148 mg/dL (ref 0.0–149.0)
VLDL: 29.6 mg/dL (ref 0.0–40.0)

## 2022-06-16 LAB — CBC WITH DIFFERENTIAL/PLATELET
Basophils Absolute: 0 10*3/uL (ref 0.0–0.1)
Basophils Relative: 0.9 % (ref 0.0–3.0)
Eosinophils Absolute: 0.2 10*3/uL (ref 0.0–0.7)
Eosinophils Relative: 3.1 % (ref 0.0–5.0)
HCT: 44.8 % (ref 39.0–52.0)
Hemoglobin: 14.6 g/dL (ref 13.0–17.0)
Lymphocytes Relative: 31.1 % (ref 12.0–46.0)
Lymphs Abs: 1.6 10*3/uL (ref 0.7–4.0)
MCHC: 32.6 g/dL (ref 30.0–36.0)
MCV: 87.2 fl (ref 78.0–100.0)
Monocytes Absolute: 0.6 10*3/uL (ref 0.1–1.0)
Monocytes Relative: 10.8 % (ref 3.0–12.0)
Neutro Abs: 2.9 10*3/uL (ref 1.4–7.7)
Neutrophils Relative %: 54.1 % (ref 43.0–77.0)
Platelets: 192 10*3/uL (ref 150.0–400.0)
RBC: 5.14 Mil/uL (ref 4.22–5.81)
RDW: 17.1 % — ABNORMAL HIGH (ref 11.5–15.5)
WBC: 5.3 10*3/uL (ref 4.0–10.5)

## 2022-06-16 LAB — PSA: PSA: 1.66 ng/mL (ref 0.10–4.00)

## 2022-06-16 LAB — HEMOGLOBIN A1C: Hgb A1c MFr Bld: 7 % — ABNORMAL HIGH (ref 4.6–6.5)

## 2022-06-23 ENCOUNTER — Ambulatory Visit: Payer: Self-pay | Admitting: Psychology

## 2022-06-27 ENCOUNTER — Ambulatory Visit (INDEPENDENT_AMBULATORY_CARE_PROVIDER_SITE_OTHER): Payer: Self-pay | Admitting: Family Medicine

## 2022-06-27 ENCOUNTER — Encounter: Payer: Self-pay | Admitting: Family Medicine

## 2022-06-27 VITALS — BP 140/72 | HR 65 | Temp 97.3°F | Ht 69.0 in | Wt 227.4 lb

## 2022-06-27 DIAGNOSIS — E669 Obesity, unspecified: Secondary | ICD-10-CM

## 2022-06-27 DIAGNOSIS — I10 Essential (primary) hypertension: Secondary | ICD-10-CM

## 2022-06-27 DIAGNOSIS — F331 Major depressive disorder, recurrent, moderate: Secondary | ICD-10-CM

## 2022-06-27 DIAGNOSIS — I480 Paroxysmal atrial fibrillation: Secondary | ICD-10-CM

## 2022-06-27 DIAGNOSIS — Z0000 Encounter for general adult medical examination without abnormal findings: Secondary | ICD-10-CM

## 2022-06-27 DIAGNOSIS — Z7189 Other specified counseling: Secondary | ICD-10-CM

## 2022-06-27 DIAGNOSIS — E785 Hyperlipidemia, unspecified: Secondary | ICD-10-CM

## 2022-06-27 DIAGNOSIS — K760 Fatty (change of) liver, not elsewhere classified: Secondary | ICD-10-CM

## 2022-06-27 DIAGNOSIS — E118 Type 2 diabetes mellitus with unspecified complications: Secondary | ICD-10-CM

## 2022-06-27 DIAGNOSIS — E1169 Type 2 diabetes mellitus with other specified complication: Secondary | ICD-10-CM

## 2022-06-27 MED ORDER — DILTIAZEM HCL ER COATED BEADS 120 MG PO CP24: ORAL_CAPSULE | ORAL | 0 refills | Status: DC

## 2022-06-27 MED ORDER — SERTRALINE HCL 100 MG PO TABS
100.0000 mg | ORAL_TABLET | Freq: Every day | ORAL | 3 refills | Status: DC
Start: 2022-06-27 — End: 2022-08-03

## 2022-06-27 MED ORDER — AMIODARONE HCL 200 MG PO TABS
200.0000 mg | ORAL_TABLET | Freq: Every day | ORAL | 0 refills | Status: DC
Start: 2022-06-27 — End: 2022-08-03

## 2022-06-27 MED ORDER — LOSARTAN POTASSIUM-HCTZ 50-12.5 MG PO TABS
1.0000 | ORAL_TABLET | Freq: Every day | ORAL | 3 refills | Status: DC
Start: 2022-06-27 — End: 2022-08-03

## 2022-06-27 MED ORDER — APIXABAN 5 MG PO TABS: 5.0000 mg | ORAL_TABLET | Freq: Two times a day (BID) | ORAL | 0 refills | Status: DC

## 2022-06-27 MED ORDER — METOPROLOL SUCCINATE ER 50 MG PO TB24
50.0000 mg | ORAL_TABLET | Freq: Every day | ORAL | 3 refills | Status: DC
Start: 2022-06-27 — End: 2022-08-03

## 2022-06-27 MED ORDER — ATORVASTATIN CALCIUM 40 MG PO TABS: 40.0000 mg | ORAL_TABLET | Freq: Every day | ORAL | 3 refills | Status: DC

## 2022-06-27 NOTE — Assessment & Plan Note (Addendum)
Sounds regular today - continue current regimen.  Overdue for cardiology f/u - has appt next month Will need TSH next labwork.
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
## 2022-06-27 NOTE — Assessment & Plan Note (Signed)

## 2022-06-27 NOTE — Assessment & Plan Note (Signed)
Chronic, stable on statin and fish oil - continue. The 10-year ASCVD risk score (Arnett DK, et al., 2019) is: 36.6%   Values used to calculate the score:     Age: 71 years     Sex: Male     Is Non-Hispanic African American: No     Diabetic: Yes     Tobacco smoker: No     Systolic Blood Pressure: 140 mmHg     Is BP treated: Yes     HDL Cholesterol: 45.1 mg/dL     Total Cholesterol: 159 mg/dL

## 2022-06-27 NOTE — Assessment & Plan Note (Signed)
Chronic, stable on hyzaar and metoprolol.

## 2022-06-27 NOTE — Assessment & Plan Note (Signed)
Encouraged healthy diet and lifestyle choices to affect sustainable weight loss.  ?

## 2022-06-27 NOTE — Assessment & Plan Note (Signed)
LFTs normal

## 2022-06-27 NOTE — Assessment & Plan Note (Signed)
Advanced directive discussion: has living will at home, does want CPR but doesn't want prolonged life support if terminal condition. Would want son [name] to be HCPOA. Will bring [place] copy.

## 2022-06-27 NOTE — Assessment & Plan Note (Addendum)
Chronic, diet controlled, A1c trending up - discussed this. He has restarted walking routine. Discussed limiting carbs in diet.

## 2022-06-27 NOTE — Patient Instructions (Addendum)
If interested, check with pharmacy about new 2 shot shingles series (shingrix).  Bring [place] copy of your advanced directive.  You are doing well today Work on decreased carbs in diet, restarting exercise routine. Return in 6 months for diabetes check.   Health Maintenance After Age 70 After age 63, you are at a higher risk for certain long-term diseases and infections as well as injuries from falls. Falls are a major cause of broken bones and head injuries in people who are older than age 51. Getting regular preventive care can help to keep you healthy and well. Preventive care includes getting regular testing and making lifestyle changes as recommended by your health care provider. Talk with your health care provider about: Which screenings and tests you should have. A screening is a test that checks for a disease when you have no symptoms. A diet and exercise plan that is right for you. What should I know about screenings and tests to prevent falls? Screening and testing are the best ways to find a health problem early. Early diagnosis and treatment give you the best chance of managing medical conditions that are common after age 4. Certain conditions and lifestyle choices may make you more likely to have a fall. Your health care provider may recommend: Regular vision checks. Poor vision and conditions such as cataracts can make you more likely to have a fall. If you wear glasses, make sure to get your prescription updated if your vision changes. Medicine review. Work with your health care provider to regularly review all of the medicines you are taking, including over-the-counter medicines. Ask your health care provider about any side effects that may make you more likely to have a fall. Tell your health care provider if any medicines that you take make you feel dizzy or sleepy. Strength and balance checks. Your health care provider may recommend certain tests to check your strength and balance while  standing, walking, or changing positions. Foot health exam. Foot pain and numbness, as well as not wearing proper footwear, can make you more likely to have a fall. Screenings, including: Osteoporosis screening. Osteoporosis is a condition that causes the bones to get weaker and break more easily. Blood pressure screening. Blood pressure changes and medicines to control blood pressure can make you feel dizzy. Depression screening. You may be more likely to have a fall if you have a fear of falling, feel depressed, or feel unable to do activities that you used to do. Alcohol use screening. Using too much alcohol can affect your balance and may make you more likely to have a fall. Follow these instructions at home: Lifestyle Do not drink alcohol if: Your health care provider tells you not to drink. If you drink alcohol: Limit how much you have to: 0-1 drink a day for women. 0-2 drinks a day for men. Know how much alcohol is in your drink. In the [place] one drink equals one 12 oz bottle of beer (355 mL), one 5 oz glass of wine (148 mL), or one 1 oz glass of hard liquor (44 mL). Do not use any products that contain nicotine or tobacco. These products include cigarettes, chewing tobacco, and vaping devices, such as e-cigarettes. If you need help quitting, ask your health care provider. Activity  Follow a regular exercise program to stay fit. This will help you maintain your balance. Ask your health care provider what types of exercise are appropriate for you. If you need a cane or walker, use it  as recommended by your health care provider. Wear supportive shoes that have nonskid soles. Safety  Remove any tripping hazards, such as rugs, cords, and clutter. Install safety equipment such as grab bars in bathrooms and safety rails on stairs. Keep rooms and walkways well-lit. General instructions Talk with your health care provider about your risks for falling. Tell your health care provider  if: You fall. Be sure to tell your health care provider about all falls, even ones that seem minor. You feel dizzy, tiredness (fatigue), or off-balance. Take over-the-counter and prescription medicines only as told by your health care provider. These include supplements. Eat a healthy diet and maintain a healthy weight. A healthy diet includes low-fat dairy products, low-fat (lean) meats, and fiber from whole grains, beans, and lots of fruits and vegetables. Stay current with your vaccines. Schedule regular health, dental, and eye exams. Summary Having a healthy lifestyle and getting preventive care can help to protect your health and wellness after age 57. Screening and testing are the best way to find a health problem early and help you avoid having a fall. Early diagnosis and treatment give you the best chance for managing medical conditions that are more common for people who are older than age 24. Falls are a major cause of broken bones and head injuries in people who are older than age 31. Take precautions to prevent a fall at home. Work with your health care provider to learn what changes you can make to improve your health and wellness and to prevent falls. This information is not intended to replace advice given to you by your health care provider. Make sure you discuss any questions you have with your health care provider. Document Revised: 04/12/2021 Document Reviewed: 04/12/2021 Elsevier Patient Education  2023 [employer].

## 2022-06-27 NOTE — Assessment & Plan Note (Signed)
Chronic, continue sertraline.

## 2022-07-07 ENCOUNTER — Ambulatory Visit (INDEPENDENT_AMBULATORY_CARE_PROVIDER_SITE_OTHER): Payer: Self-pay | Admitting: Psychology

## 2022-07-07 DIAGNOSIS — F4323 Adjustment disorder with mixed anxiety and depressed mood: Secondary | ICD-10-CM

## 2022-07-07 NOTE — Progress Notes (Signed)
[facility] Counselor/Therapist Progress Note  Patient ID: Jamir Rone, MRN: [id number],    Date: 07/07/2022  Time Spent: 12:00pm - 12:55pm    55 minutes   Treatment Type: Individual Therapy  Reported Symptoms: stress  Mental Status Exam: Appearance:  Casual     Behavior: Appropriate  Motor: Normal  Speech/Language:  Normal Rate  Affect: Appropriate  Mood: normal  Thought process: normal  Thought content:   WNL  Sensory/Perceptual disturbances:   WNL  Orientation: oriented to person, place, time/date, and situation  Attention: Good  Concentration: Good  Memory: WNL  Fund of knowledge:  Good  Insight:   Good  Judgment:  Good  Impulse Control: Good   Risk Assessment: Danger to Self:  No Self-injurious Behavior: No Danger to Others: No Duty to Warn:no Physical Aggression / Violence:No  Access to Firearms a concern: No  Gang Involvement:No   Subjective: Pt present for face-to-face individual therapy via video Webex.  Pt consents to telehealth video session due to COVID 19 pandemic. Location of pt: home Location of therapist: home office. Pt talked about putting more time in reflecting on his goals and wish to live a purpose driven life.   Helped pt process his thoughts and feelings about where he is in his life.   Pt talked about work.  Work has been going better in general.  He has had some closings that were needed financially.   Pt is working on focusing on gratitude.   Worked on additional self affirmation strategies.   Provided supportive therapy.    Interventions: Cognitive Behavioral Therapy and Insight-Oriented  Diagnosis:  F43.23  Plan: Plan of Care: Recommend ongoing therapy.  Pt participated in setting treatment goals.  Plan to continue to meet every two weeks.   Treatment Plan Client Abilities/Strengths  Pt is bright, engaging, and motivated for therapy.  Client Treatment Preferences  Individual therapy.  Client Statement of Needs   Improve copings skills and understand herself better. Improve self esteem.  Symptoms  Depressed or irritable mood. Excessive and/or unrealistic worry that is difficult to control occurring more days than not for at least 6 months about a number of events or activities. Hypervigilance (e.g., feeling constantly on edge, experiencing concentration difficulties, having trouble falling or staying asleep, exhibiting a general state of irritability). Low self-esteem. Problems Addressed  Unipolar Depression, Anxiety Goals 1. Alleviate depressive symptoms and return to previous level of effective functioning. 2. Appropriately grieve the loss in order to normalize mood and to return to previously adaptive level of functioning. Objective Learn and implement behavioral strategies to overcome depression. Target Date: 2023-04-15 Frequency: Biweekly  Progress: 60 Modality: individual  Related Interventions Assist the client in developing skills that increase the likelihood of deriving pleasure from behavioral activation (e.g., assertiveness skills, developing an exercise plan, less internal/more external focus, increased social involvement); reinforce success. Engage the client in "behavioral activation," increasing his/her activity level and contact with sources of reward, while identifying processes that inhibit activation. use behavioral techniques such as instruction, rehearsal, role-playing, role reversal, as needed, to facilitate activity in the client's daily life; reinforce success. 3. Develop healthy interpersonal relationships that lead to the alleviation and help prevent the relapse of depression. 4. Develop healthy thinking patterns and beliefs about self, others, and the world that lead to the alleviation and help prevent the relapse of depression. 5. Enhance ability to effectively cope with the full variety of life's worries and anxieties. 6. Learn and implement coping skills that result in  a  reduction of anxiety and worry, and improved daily functioning. Objective Learn and implement problem-solving strategies for realistically addressing worries. Target Date: 2023-04-15 Frequency: Biweekly  Progress: 60 Modality: individual  Related Interventions Assign the client a homework exercise in which he/she problem-solves a current problem (see Mastery of Your Anxiety and Worry: Workbook by Elenora Fender and Filbert Schilder or Generalized Anxiety Disorder by Elesa Hacker, and Filbert Schilder); review, reinforce success, and provide corrective feedback toward improvement. Teach the client problem-solving strategies involving specifically defining a problem, generating options for addressing it, evaluating the pros and cons of each option, selecting and implementing an optional action, and reevaluating and refining the action. Objective Learn and implement calming skills to reduce overall anxiety and manage anxiety symptoms. Target Date: 2023-04-15 Frequency: Biweekly  Progress: 60 Modality: individual  Related Interventions Assign the client to read about progressive muscle relaxation and other calming strategies in relevant books or treatment manuals (e.g., Progressive Relaxation Training by Twana First; Mastery of Your Anxiety and Worry: Workbook by Earlie Counts). Assign the client homework each session in which he/she practices relaxation exercises daily, gradually applying them progressively from non-anxiety-provoking to anxiety-provoking situations; review and reinforce success while providing corrective feedback toward improvement. Teach the client calming/relaxation skills (e.g., applied relaxation, progressive muscle relaxation, cue controlled relaxation; mindful breathing; biofeedback) and how to discriminate better between relaxation and tension; teach the client how to apply these skills to his/her daily life. 7. Recognize, accept, and cope with feelings of depression. 8. Reduce overall  frequency, intensity, and duration of the anxiety so that daily functioning is not impaired. 9. Resolve the core conflict that is the source of anxiety. 10. Stabilize anxiety level while increasing ability to function on a daily basis. Diagnosis F43.23  Conditions For Discharge Achievement of treatment goals and objectives   Salomon Fick, LCSW

## 2022-07-21 ENCOUNTER — Ambulatory Visit (INDEPENDENT_AMBULATORY_CARE_PROVIDER_SITE_OTHER): Payer: Self-pay | Admitting: Psychology

## 2022-07-21 DIAGNOSIS — F4323 Adjustment disorder with mixed anxiety and depressed mood: Secondary | ICD-10-CM

## 2022-07-21 NOTE — Progress Notes (Signed)
[facility] Counselor/Therapist Progress Note  Patient ID: Jonathon Middleton, MRN: [id number],    Date: 07/21/2022  Time Spent: 12:00pm - 12:55pm    55 minutes   Treatment Type: Individual Therapy  Reported Symptoms: stress  Mental Status Exam: Appearance:  Casual     Behavior: Appropriate  Motor: Normal  Speech/Language:  Normal Rate  Affect: Appropriate  Mood: normal  Thought process: normal  Thought content:   WNL  Sensory/Perceptual disturbances:   WNL  Orientation: oriented to person, place, time/date, and situation  Attention: Good  Concentration: Good  Memory: WNL  Fund of knowledge:  Good  Insight:   Good  Judgment:  Good  Impulse Control: Good   Risk Assessment: Danger to Self:  No Self-injurious Behavior: No Danger to Others: No Duty to Warn:no Physical Aggression / Violence:No  Access to Firearms a concern: No  Gang Involvement:No   Subjective: Pt present for face-to-face individual therapy via video Webex.  Pt consents to telehealth video session due to COVID 19 pandemic. Location of pt: home Location of therapist: home office. Pt talked about work.  He has been very busy and worked 12 hours yesterday and was exhausted.   Pt feels needed and like his opinion is valued. Pt states he is taking baby steps to working on perfection.  Pt is working on "starting to live intentionally."  He is trying to be in charge of his purpose.  He is trying to stop trying to get somewhere else and embrace where he is now.   Worked with pt on present moment mindfulness strategies.   Worked on additional self affirmation strategies.   Provided supportive therapy.    Interventions: Cognitive Behavioral Therapy and Insight-Oriented  Diagnosis:  F43.23  Plan: Plan of Care: Recommend ongoing therapy.  Pt participated in setting treatment goals.  Plan to continue to meet every two weeks.   Treatment Plan Client Abilities/Strengths  Pt is bright, engaging, and  motivated for therapy.  Client Treatment Preferences  Individual therapy.  Client Statement of Needs  Improve copings skills and understand herself better. Improve self esteem.  Symptoms  Depressed or irritable mood. Excessive and/or unrealistic worry that is difficult to control occurring more days than not for at least 6 months about a number of events or activities. Hypervigilance (e.g., feeling constantly on edge, experiencing concentration difficulties, having trouble falling or staying asleep, exhibiting a general state of irritability). Low self-esteem. Problems Addressed  Unipolar Depression, Anxiety Goals 1. Alleviate depressive symptoms and return to previous level of effective functioning. 2. Appropriately grieve the loss in order to normalize mood and to return to previously adaptive level of functioning. Objective Learn and implement behavioral strategies to overcome depression. Target Date: 2023-04-15 Frequency: Biweekly  Progress: 60 Modality: individual  Related Interventions Assist the client in developing skills that increase the likelihood of deriving pleasure from behavioral activation (e.g., assertiveness skills, developing an exercise plan, less internal/more external focus, increased social involvement); reinforce success. Engage the client in "behavioral activation," increasing his/her activity level and contact with sources of reward, while identifying processes that inhibit activation. use behavioral techniques such as instruction, rehearsal, role-playing, role reversal, as needed, to facilitate activity in the client's daily life; reinforce success. 3. Develop healthy interpersonal relationships that lead to the alleviation and help prevent the relapse of depression. 4. Develop healthy thinking patterns and beliefs about self, others, and the world that lead to the alleviation and help prevent the relapse of depression. 5. Enhance ability to effectively  cope with the  full variety of life's worries and anxieties. 6. Learn and implement coping skills that result in a reduction of anxiety and worry, and improved daily functioning. Objective Learn and implement problem-solving strategies for realistically addressing worries. Target Date: 2023-04-15 Frequency: Biweekly  Progress: 60 Modality: individual  Related Interventions Assign the client a homework exercise in which he/she problem-solves a current problem (see Mastery of Your Anxiety and Worry: Workbook by Elenora Fender and Filbert Schilder or Generalized Anxiety Disorder by Elesa Hacker, and Filbert Schilder); review, reinforce success, and provide corrective feedback toward improvement. Teach the client problem-solving strategies involving specifically defining a problem, generating options for addressing it, evaluating the pros and cons of each option, selecting and implementing an optional action, and reevaluating and refining the action. Objective Learn and implement calming skills to reduce overall anxiety and manage anxiety symptoms. Target Date: 2023-04-15 Frequency: Biweekly  Progress: 60 Modality: individual  Related Interventions Assign the client to read about progressive muscle relaxation and other calming strategies in relevant books or treatment manuals (e.g., Progressive Relaxation Training by Twana First; Mastery of Your Anxiety and Worry: Workbook by Earlie Counts). Assign the client homework each session in which he/she practices relaxation exercises daily, gradually applying them progressively from non-anxiety-provoking to anxiety-provoking situations; review and reinforce success while providing corrective feedback toward improvement. Teach the client calming/relaxation skills (e.g., applied relaxation, progressive muscle relaxation, cue controlled relaxation; mindful breathing; biofeedback) and how to discriminate better between relaxation and tension; teach the client how to apply these skills to  his/her daily life. 7. Recognize, accept, and cope with feelings of depression. 8. Reduce overall frequency, intensity, and duration of the anxiety so that daily functioning is not impaired. 9. Resolve the core conflict that is the source of anxiety. 10. Stabilize anxiety level while increasing ability to function on a daily basis. Diagnosis F43.23  Conditions For Discharge Achievement of treatment goals and objectives   Salomon Fick, LCSW

## 2022-08-01 ENCOUNTER — Encounter: Payer: Self-pay | Admitting: Family Medicine

## 2022-08-03 MED ORDER — DILTIAZEM HCL ER COATED BEADS 120 MG PO CP24: ORAL_CAPSULE | ORAL | 3 refills | Status: DC

## 2022-08-03 MED ORDER — ATORVASTATIN CALCIUM 40 MG PO TABS: 40.0000 mg | ORAL_TABLET | Freq: Every day | ORAL | 3 refills | Status: DC

## 2022-08-03 MED ORDER — METOPROLOL SUCCINATE ER 50 MG PO TB24: 50.0000 mg | ORAL_TABLET | Freq: Every day | ORAL | 3 refills | Status: DC

## 2022-08-03 MED ORDER — APIXABAN 5 MG PO TABS
5.0000 mg | ORAL_TABLET | Freq: Two times a day (BID) | ORAL | 3 refills | Status: DC
Start: 2022-08-03 — End: 2023-06-28

## 2022-08-03 MED ORDER — ONETOUCH ULTRASOFT LANCETS MISC: 1 refills | Status: DC

## 2022-08-03 MED ORDER — AMIODARONE HCL 200 MG PO TABS: 200.0000 mg | ORAL_TABLET | Freq: Every day | ORAL | 3 refills | Status: DC

## 2022-08-03 MED ORDER — LOSARTAN POTASSIUM-HCTZ 50-12.5 MG PO TABS
1.0000 | ORAL_TABLET | Freq: Every day | ORAL | 3 refills | Status: DC
Start: 2022-08-03 — End: 2023-06-28

## 2022-08-03 MED ORDER — SERTRALINE HCL 100 MG PO TABS: 100.0000 mg | ORAL_TABLET | Freq: Every day | ORAL | 3 refills | Status: DC

## 2022-08-03 MED ORDER — GLUCOSE BLOOD VI STRP: ORAL_STRIP | 1 refills | Status: DC

## 2022-08-04 ENCOUNTER — Ambulatory Visit (INDEPENDENT_AMBULATORY_CARE_PROVIDER_SITE_OTHER): Payer: Self-pay | Admitting: Psychology

## 2022-08-04 DIAGNOSIS — F4323 Adjustment disorder with mixed anxiety and depressed mood: Secondary | ICD-10-CM

## 2022-08-04 NOTE — Progress Notes (Signed)
[facility] Counselor/Therapist Progress Note  Patient ID: Jonathon Middleton, MRN: [id number],    Date: 08/04/2022  Time Spent: 12:00pm - 12:55pm    55 minutes   Treatment Type: Individual Therapy  Reported Symptoms: stress  Mental Status Exam: Appearance:  Casual     Behavior: Appropriate  Motor: Normal  Speech/Language:  Normal Rate  Affect: Appropriate  Mood: normal  Thought process: normal  Thought content:   WNL  Sensory/Perceptual disturbances:   WNL  Orientation: oriented to person, place, time/date, and situation  Attention: Good  Concentration: Good  Memory: WNL  Fund of knowledge:  Good  Insight:   Good  Judgment:  Good  Impulse Control: Good   Risk Assessment: Danger to Self:  No Self-injurious Behavior: No Danger to Others: No Duty to Warn:no Physical Aggression / Violence:No  Access to Firearms a concern: No  Gang Involvement:No   Subjective: Pt present for face-to-face individual therapy via video Webex.  Pt consents to telehealth video session due to COVID 19 pandemic. Location of pt: home Location of therapist: home office. Pt talked about thinking about his identity.  He has looked as his identity based on "I am...." instead of "I want to be...."  Pt is working on trying to reframe his past.  Pt talked about work.  There have been several projects and the job has been busy.  Pt rarely takes a vacation bc of fear of financial loss.   Worked with pt on present moment mindfulness strategies.   Worked on additional self affirmation strategies.   Provided supportive therapy.    Interventions: Cognitive Behavioral Therapy and Insight-Oriented  Diagnosis:  F43.23  Plan: Plan of Care: Recommend ongoing therapy.  Pt participated in setting treatment goals.  Plan to continue to meet every two weeks.   Treatment Plan Client Abilities/Strengths  Pt is bright, engaging, and motivated for therapy.  Client Treatment Preferences  Individual  therapy.  Client Statement of Needs  Improve copings skills and understand herself better. Improve self esteem.  Symptoms  Depressed or irritable mood. Excessive and/or unrealistic worry that is difficult to control occurring more days than not for at least 6 months about a number of events or activities. Hypervigilance (e.g., feeling constantly on edge, experiencing concentration difficulties, having trouble falling or staying asleep, exhibiting a general state of irritability). Low self-esteem. Problems Addressed  Unipolar Depression, Anxiety Goals 1. Alleviate depressive symptoms and return to previous level of effective functioning. 2. Appropriately grieve the loss in order to normalize mood and to return to previously adaptive level of functioning. Objective Learn and implement behavioral strategies to overcome depression. Target Date: 2023-04-15 Frequency: Biweekly  Progress: 60 Modality: individual  Related Interventions Assist the client in developing skills that increase the likelihood of deriving pleasure from behavioral activation (e.g., assertiveness skills, developing an exercise plan, less internal/more external focus, increased social involvement); reinforce success. Engage the client in "behavioral activation," increasing his/her activity level and contact with sources of reward, while identifying processes that inhibit activation. use behavioral techniques such as instruction, rehearsal, role-playing, role reversal, as needed, to facilitate activity in the client's daily life; reinforce success. 3. Develop healthy interpersonal relationships that lead to the alleviation and help prevent the relapse of depression. 4. Develop healthy thinking patterns and beliefs about self, others, and the world that lead to the alleviation and help prevent the relapse of depression. 5. Enhance ability to effectively cope with the full variety of life's worries and anxieties. 6. Learn and  implement coping skills that result in a reduction of anxiety and worry, and improved daily functioning. Objective Learn and implement problem-solving strategies for realistically addressing worries. Target Date: 2023-04-15 Frequency: Biweekly  Progress: 60 Modality: individual  Related Interventions Assign the client a homework exercise in which he/she problem-solves a current problem (see Mastery of Your Anxiety and Worry: Workbook by Elenora Fender and Filbert Schilder or Generalized Anxiety Disorder by Elesa Hacker, and Filbert Schilder); review, reinforce success, and provide corrective feedback toward improvement. Teach the client problem-solving strategies involving specifically defining a problem, generating options for addressing it, evaluating the pros and cons of each option, selecting and implementing an optional action, and reevaluating and refining the action. Objective Learn and implement calming skills to reduce overall anxiety and manage anxiety symptoms. Target Date: 2023-04-15 Frequency: Biweekly  Progress: 60 Modality: individual  Related Interventions Assign the client to read about progressive muscle relaxation and other calming strategies in relevant books or treatment manuals (e.g., Progressive Relaxation Training by Twana First; Mastery of Your Anxiety and Worry: Workbook by Earlie Counts). Assign the client homework each session in which he/she practices relaxation exercises daily, gradually applying them progressively from non-anxiety-provoking to anxiety-provoking situations; review and reinforce success while providing corrective feedback toward improvement. Teach the client calming/relaxation skills (e.g., applied relaxation, progressive muscle relaxation, cue controlled relaxation; mindful breathing; biofeedback) and how to discriminate better between relaxation and tension; teach the client how to apply these skills to his/her daily life. 7. Recognize, accept, and cope with  feelings of depression. 8. Reduce overall frequency, intensity, and duration of the anxiety so that daily functioning is not impaired. 9. Resolve the core conflict that is the source of anxiety. 10. Stabilize anxiety level while increasing ability to function on a daily basis. Diagnosis F43.23  Conditions For Discharge Achievement of treatment goals and objectives   Salomon Fick, LCSW

## 2022-08-14 NOTE — Progress Notes (Unsigned)
Cardiology Office Note  Date:  08/15/2022   ID:  Jonathon Middleton, DOB 1951-02-19, MRN [id number]  PCP:  Ria Bush, MD   Chief Complaint  Patient presents with   12 month follow up     "Doing well." Medications reviewed by the patient verbally.     HPI:  Jonathon Middleton is a 71 year old gentleman with past medical history of smoking  for 5 years who stopped in 1984, PAF, 2014, 2017 , 2019, Chads vasc score of 1.  who presents for follow-up of his paroxysmal atrial fibrillation.  Last seen by myself in clinic August 2021  In follow-up today reports he is doing well Weight trending higher, no regular exercise program Denies any tachypalpitations concerning for arrhythmia On amiodarone with metoprolol and diltiazem Blood pressure reasonably well controlled at home  Tolerating Lipitor  Lab work reviewed A1C 7.0, up from 6.2 Total chol 159, LDL 84 TSH   Lab Results  Component Value Date   CHOL 159 06/16/2022   HDL 45.10 06/16/2022   LDLCALC 84 06/16/2022   TRIG 148.0 06/16/2022    EKG personally reviewed by myself on todays visit Shows normal sinus rhythm with rate 56 bpm no significant ST or T wave changes   Other past medical history reviewed Sept 2018 fell off ladder Broke humerus on the right rebroke his arm, "overdoing it"  surgery again   father had heart attack at age 66 and died. He was a long-time smoker. Mother lived to 58  PMH:   has a past medical history of Arthritis, Atrial fibrillation ([place]), Depression, Depression with anxiety, GERD (gastroesophageal reflux disease), History of chicken pox, Humerus fracture, Hyperlipidemia, Hypertension, Nonalcoholic fatty liver disease, Pre-diabetes, and Prediabetes.  PSH:    Past Surgical History:  Procedure Laterality Date   COLONOSCOPY     HARDWARE REMOVAL Right 09/21/2017   Procedure: HARDWARE REMOVAL OF RIGHT HUMERUS;  Surgeon: Leandrew Koyanagi, MD;  Location: [facility];  Service: Orthopedics;  Laterality:  Right;   KNEE SURGERY Right 2009   x 2 (Dr. Marry Guan)  Arthroscopy- torn MCL   ORIF HUMERUS FRACTURE Right 08/25/2017   Procedure: OPEN REDUCTION INTERNAL FIXATION (ORIF) RIGHT HUMERAL SHAFT FRACTURE;  Surgeon: Leandrew Koyanagi, MD;  Location: [place] [facility];  Service: Orthopedics;  Laterality: Right;   ORIF HUMERUS FRACTURE Right 09/21/2017   Procedure: OPEN REDUCTION INTERNAL FIXATION (ORIF) RIGHT HUMERAL SHAFT FRACTURE;  Surgeon: Leandrew Koyanagi, MD;  Location: [place];  Service: Orthopedics;  Laterality: Right;    Current Outpatient Medications  Medication Sig Dispense Refill   amiodarone (PACERONE) 200 MG tablet Take 1 tablet (200 mg total) by mouth daily. 90 tablet 3   apixaban (ELIQUIS) 5 MG TABS tablet Take 1 tablet (5 mg total) by mouth 2 (two) times daily. 180 tablet 3   atorvastatin (LIPITOR) 40 MG tablet Take 1 tablet (40 mg total) by mouth daily. 90 tablet 3   Cholecalciferol (VITAMIN D) 2000 units CAPS Take 2,000 Units by mouth 2 (two) times daily.      Coenzyme Q10 (CO Q-10) 100 MG CAPS Take 100 mg by mouth daily with breakfast.      diltiazem (CARDIZEM CD) 120 MG 24 hr capsule TAKE 1 CAPSULE BY MOUTH EVERY DAY 90 capsule 3   glucose blood (ONE TOUCH ULTRA TEST) test strip Use to check sugar once daily and as needed. Dx: E11.9 100 each 1   Lancets (ONETOUCH ULTRASOFT) lancets USE AS INSTRUCTED 100 each 1   losartan-hydrochlorothiazide (HYZAAR) 50-12.5  MG tablet Take 1 tablet by mouth daily. 90 tablet 3   metoprolol succinate (TOPROL-XL) 50 MG 24 hr tablet Take 1 tablet (50 mg total) by mouth daily. Take with or immediately following a meal. 90 tablet 3   omega-3 fish oil (MAXEPA) 1000 MG CAPS capsule Take 1 capsule by mouth 2 (two) times daily.      sertraline (ZOLOFT) 100 MG tablet Take 1 tablet (100 mg total) by mouth daily. 90 tablet 3   TURMERIC PO Take by mouth in the morning and at bedtime.     No current facility-administered medications for this visit.    Allergies:   Patient has no  known allergies.   Social History:  The patient  reports that he has quit smoking. His smoking use included cigars and cigarettes. He has a 5.00 pack-year smoking history. He has never used smokeless tobacco. He reports current alcohol use of about 2.0 standard drinks of alcohol per week. He reports that he does not use drugs.   Family History:   family history includes CAD (age of onset: 42) in his father; CAD (age of onset: 36) in his paternal uncle; Cancer in his maternal uncle; Diabetes in his paternal grandmother; Hypertension in his mother; Stroke in his paternal aunt.    Review of Systems: Review of Systems  Constitutional: Negative.   HENT: Negative.    Respiratory: Negative.    Cardiovascular: Negative.   Gastrointestinal: Negative.   Musculoskeletal: Negative.   Neurological: Negative.   Psychiatric/Behavioral: Negative.    All other systems reviewed and are negative.   PHYSICAL EXAM: VS:  BP 130/80 (BP Location: Left Arm, Patient Position: Sitting, Cuff Size: Normal)   Pulse (!) 56   Ht 5\' 9"  (1.753 m)   Wt 229 lb 8 oz (104.1 kg)   SpO2 94%   BMI 33.89 kg/m  , BMI Body mass index is 33.89 kg/m. Constitutional:  oriented to person, place, and time. No distress.  HENT:  Head: Grossly normal Eyes:  no discharge. No scleral icterus.  Neck: No JVD, no carotid bruits  Cardiovascular: Regular rate and rhythm, no murmurs appreciated Pulmonary/Chest: Clear to auscultation bilaterally, no wheezes or rails Abdominal: Soft.  no distension.  no tenderness.  Musculoskeletal: Normal range of motion Neurological:  normal muscle tone. Coordination normal. No atrophy Skin: Skin warm and dry Psychiatric: normal affect, pleasant Mood Recent Labs: 06/16/2022: ALT 36; BUN 31; Creatinine, Ser 1.26; Hemoglobin 14.6; Platelets 192.0; Potassium 4.3; Sodium 142    Lipid Panel Lab Results  Component Value Date   CHOL 159 06/16/2022   HDL 45.10 06/16/2022   LDLCALC 84 06/16/2022    TRIG 148.0 06/16/2022      Wt Readings from Last 3 Encounters:  08/15/22 229 lb 8 oz (104.1 kg)  06/27/22 227 lb 6 oz (103.1 kg)  02/21/22 224 lb 6 oz (101.8 kg)     ASSESSMENT AND PLAN:  Paroxysmal atrial fibrillation (HCC) - Plan: EKG 12-Lead No significant arrhythmia noted We will continue current medications, Tolerating anticoagulation Order for TSH placed on amiodarone  Hyperlipidemia On Lipitor, numbers trending higher Recommended weight loss, exercise diet changes  Essential hypertension Blood pressure is well controlled on today's visit. No changes made to the medications.  Smoking history Stopped smoking many years ago  Controlled type 2 diabetes mellitus without complication, without long-term current use of insulin (HCC) Weight higher We have encouraged continued exercise, careful diet management in an effort to lose weight. Interested in 02/23/22  care Could consider CT coronary calcium scoring   Total encounter time more than 30 minutes  Greater than 50% was spent in counseling and coordination of care with the patient   Orders Placed This Encounter  Procedures   EKG 12-Lead     Signed, Dossie Arbour, M.D., Ph.D. 08/15/2022  [facility] [place], [place] [phone]

## 2022-08-15 ENCOUNTER — Other Ambulatory Visit
Admission: RE | Admit: 2022-08-15 | Discharge: 2022-08-15 | Disposition: A | Discharge status: Home / Self Care | Payer: Self-pay | Attending: Cardiovascular Disease | Admitting: Cardiovascular Disease

## 2022-08-15 ENCOUNTER — Ambulatory Visit: Payer: Self-pay | Attending: Cardiovascular Disease | Admitting: Cardiovascular Disease

## 2022-08-15 ENCOUNTER — Encounter: Payer: Self-pay | Admitting: Cardiovascular Disease

## 2022-08-15 VITALS — BP 130/80 | HR 56 | Ht 69.0 in | Wt 229.5 lb

## 2022-08-15 DIAGNOSIS — E785 Hyperlipidemia, unspecified: Secondary | ICD-10-CM

## 2022-08-15 DIAGNOSIS — E1169 Type 2 diabetes mellitus with other specified complication: Secondary | ICD-10-CM

## 2022-08-15 DIAGNOSIS — I10 Essential (primary) hypertension: Secondary | ICD-10-CM

## 2022-08-15 DIAGNOSIS — I480 Paroxysmal atrial fibrillation: Secondary | ICD-10-CM | POA: Insufficient documentation

## 2022-08-15 LAB — TSH: TSH: 8.743 u[IU]/mL — ABNORMAL HIGH (ref 0.350–4.500)

## 2022-08-15 NOTE — Patient Instructions (Addendum)
Medication Instructions:   No changes  If you need a refill on your cardiac medications before your next appointment, please call your pharmacy.   Lab work:  Your physician recommends that you go to the medical mall to get lab work -- TSH   Testing/Procedures:  No new testing needed  Follow-Up: At [name], you and your health needs are our priority.  As part of our continuing mission to provide you with exceptional heart care, we have created designated Provider Care Teams.  These Care Teams include your primary Cardiologist (physician) and Advanced Practice Providers (APPs -  Physician Assistants and Nurse Practitioners) who all work together to provide you with the care you need, when you need it.  You will need a follow up appointment in 12 months  Providers on your designated Care Team:   Nicolasa Ducking, NP Eula Listen, PA-C Cadence Fransico Michael, [place]  COVID-19 Vaccine Information can be found at: [email or web address] For questions related to vaccine distribution or appointments, please email [email or web address] or call [phone].

## 2022-08-16 ENCOUNTER — Encounter: Payer: Self-pay | Admitting: Cardiovascular Disease

## 2022-08-18 ENCOUNTER — Ambulatory Visit (INDEPENDENT_AMBULATORY_CARE_PROVIDER_SITE_OTHER): Payer: Self-pay | Admitting: Psychology

## 2022-08-18 DIAGNOSIS — F4323 Adjustment disorder with mixed anxiety and depressed mood: Secondary | ICD-10-CM

## 2022-08-18 NOTE — Progress Notes (Signed)
[facility] Counselor/Therapist Progress Note  Patient ID: Dray Dente, MRN: [id number],    Date: 08/18/2022  Time Spent: 12:00pm - 12:55pm    55 minutes   Treatment Type: Individual Therapy  Reported Symptoms: stress  Mental Status Exam: Appearance:  Casual     Behavior: Appropriate  Motor: Normal  Speech/Language:  Normal Rate  Affect: Appropriate  Mood: normal  Thought process: normal  Thought content:   WNL  Sensory/Perceptual disturbances:   WNL  Orientation: oriented to person, place, time/date, and situation  Attention: Good  Concentration: Good  Memory: WNL  Fund of knowledge:  Good  Insight:   Good  Judgment:  Good  Impulse Control: Good   Risk Assessment: Danger to Self:  No Self-injurious Behavior: No Danger to Others: No Duty to Warn:no Physical Aggression / Violence:No  Access to Firearms a concern: No  Gang Involvement:No   Subjective: Pt present for face-to-face individual therapy via video Webex.  Pt consents to telehealth video session due to COVID 19 pandemic. Location of pt: home Location of therapist: home office. Pt talked about how he still thinks about his past when considering his current identity.  Pt has been reflecting on how to make his remaining years as productive and fullfilling as possible.   Pt identified that his lack of self confidence gets in his way.   Pt talked about tending to be inconsistent with working on his goals.   Pt states he wants to be a better person and improve himself.  Pt realizes he needs to break things down into "baby steps".   Helped pt work on breaking down goals into achievable steps. Pt talked about turning 71 next month.  He does not feel good about it.  Helped pt process his feelings about aging.     Worked with pt on present moment mindfulness strategies.   Worked on additional self affirmation strategies.   Provided supportive therapy.    Interventions: Cognitive Behavioral Therapy and  Insight-Oriented  Diagnosis:  F43.23  Plan: Plan of Care: Recommend ongoing therapy.  Pt participated in setting treatment goals.  Plan to continue to meet every two weeks.   Treatment Plan Client Abilities/Strengths  Pt is bright, engaging, and motivated for therapy.  Client Treatment Preferences  Individual therapy.  Client Statement of Needs  Improve copings skills and understand herself better. Improve self esteem.  Symptoms  Depressed or irritable mood. Excessive and/or unrealistic worry that is difficult to control occurring more days than not for at least 6 months about a number of events or activities. Hypervigilance (e.g., feeling constantly on edge, experiencing concentration difficulties, having trouble falling or staying asleep, exhibiting a general state of irritability). Low self-esteem. Problems Addressed  Unipolar Depression, Anxiety Goals 1. Alleviate depressive symptoms and return to previous level of effective functioning. 2. Appropriately grieve the loss in order to normalize mood and to return to previously adaptive level of functioning. Objective Learn and implement behavioral strategies to overcome depression. Target Date: 2023-04-15 Frequency: Biweekly  Progress: 60 Modality: individual  Related Interventions Assist the client in developing skills that increase the likelihood of deriving pleasure from behavioral activation (e.g., assertiveness skills, developing an exercise plan, less internal/more external focus, increased social involvement); reinforce success. Engage the client in "behavioral activation," increasing his/her activity level and contact with sources of reward, while identifying processes that inhibit activation. use behavioral techniques such as instruction, rehearsal, role-playing, role reversal, as needed, to facilitate activity in the client's daily life; reinforce success.  3. Develop healthy interpersonal relationships that lead to the  alleviation and help prevent the relapse of depression. 4. Develop healthy thinking patterns and beliefs about self, others, and the world that lead to the alleviation and help prevent the relapse of depression. 5. Enhance ability to effectively cope with the full variety of life's worries and anxieties. 6. Learn and implement coping skills that result in a reduction of anxiety and worry, and improved daily functioning. Objective Learn and implement problem-solving strategies for realistically addressing worries. Target Date: 2023-04-15 Frequency: Biweekly  Progress: 60 Modality: individual  Related Interventions Assign the client a homework exercise in which he/she problem-solves a current problem (see Mastery of Your Anxiety and Worry: Workbook by Elenora Fender and Filbert Schilder or Generalized Anxiety Disorder by Elesa Hacker, and Filbert Schilder); review, reinforce success, and provide corrective feedback toward improvement. Teach the client problem-solving strategies involving specifically defining a problem, generating options for addressing it, evaluating the pros and cons of each option, selecting and implementing an optional action, and reevaluating and refining the action. Objective Learn and implement calming skills to reduce overall anxiety and manage anxiety symptoms. Target Date: 2023-04-15 Frequency: Biweekly  Progress: 60 Modality: individual  Related Interventions Assign the client to read about progressive muscle relaxation and other calming strategies in relevant books or treatment manuals (e.g., Progressive Relaxation Training by Twana First; Mastery of Your Anxiety and Worry: Workbook by Earlie Counts). Assign the client homework each session in which he/she practices relaxation exercises daily, gradually applying them progressively from non-anxiety-provoking to anxiety-provoking situations; review and reinforce success while providing corrective feedback toward improvement. Teach the  client calming/relaxation skills (e.g., applied relaxation, progressive muscle relaxation, cue controlled relaxation; mindful breathing; biofeedback) and how to discriminate better between relaxation and tension; teach the client how to apply these skills to his/her daily life. 7. Recognize, accept, and cope with feelings of depression. 8. Reduce overall frequency, intensity, and duration of the anxiety so that daily functioning is not impaired. 9. Resolve the core conflict that is the source of anxiety. 10. Stabilize anxiety level while increasing ability to function on a daily basis. Diagnosis F43.23  Conditions For Discharge Achievement of treatment goals and objectives   Salomon Fick, LCSW

## 2022-09-01 ENCOUNTER — Ambulatory Visit (INDEPENDENT_AMBULATORY_CARE_PROVIDER_SITE_OTHER): Payer: Self-pay | Admitting: Psychology

## 2022-09-01 DIAGNOSIS — F4323 Adjustment disorder with mixed anxiety and depressed mood: Secondary | ICD-10-CM

## 2022-09-01 NOTE — Progress Notes (Signed)
[facility] Counselor/Therapist Progress Note  Patient ID: Jonathon Middleton, MRN: [id number],    Date: 09/01/2022  Time Spent: 12:00pm - 12:55pm    55 minutes   Treatment Type: Individual Therapy  Reported Symptoms: stress  Mental Status Exam: Appearance:  Casual     Behavior: Appropriate  Motor: Normal  Speech/Language:  Normal Rate  Affect: Appropriate  Mood: normal  Thought process: normal  Thought content:   WNL  Sensory/Perceptual disturbances:   WNL  Orientation: oriented to person, place, time/date, and situation  Attention: Good  Concentration: Good  Memory: WNL  Fund of knowledge:  Good  Insight:   Good  Judgment:  Good  Impulse Control: Good   Risk Assessment: Danger to Self:  No Self-injurious Behavior: No Danger to Others: No Duty to Warn:no Physical Aggression / Violence:No  Access to Firearms a concern: No  Gang Involvement:No   Subjective: Pt present for face-to-face individual therapy via video Webex.  Pt consents to telehealth video session due to COVID 19 pandemic. Location of pt: home Location of therapist: home office. Pt talked about work which has been very stressful the past couple of weeks.  Pt has not been making the money he needs to make.  He is paid on commission.  Pt states he is "tired of being disrespected".    Addressed pt's work frustrations.  Worked on Child psychotherapist.   Pt feels deflated.  Helped pt process his thoughts and feelings.  Identified that pt has been struggling with trying to "people please".    Worked with pt on present moment mindfulness strategies.   Worked on additional self affirmation strategies.   Pt states he is not eating healthily or exercising.   Provided supportive therapy.    Interventions: Cognitive Behavioral Therapy and Insight-Oriented  Diagnosis:  F43.23  Plan: Plan of Care: Recommend ongoing therapy.  Pt participated in setting treatment goals.  Plan to continue to meet every two  weeks.   Treatment Plan Client Abilities/Strengths  Pt is bright, engaging, and motivated for therapy.  Client Treatment Preferences  Individual therapy.  Client Statement of Needs  Improve copings skills and understand herself better. Improve self esteem.  Symptoms  Depressed or irritable mood. Excessive and/or unrealistic worry that is difficult to control occurring more days than not for at least 6 months about a number of events or activities. Hypervigilance (e.g., feeling constantly on edge, experiencing concentration difficulties, having trouble falling or staying asleep, exhibiting a general state of irritability). Low self-esteem. Problems Addressed  Unipolar Depression, Anxiety Goals 1. Alleviate depressive symptoms and return to previous level of effective functioning. 2. Appropriately grieve the loss in order to normalize mood and to return to previously adaptive level of functioning. Objective Learn and implement behavioral strategies to overcome depression. Target Date: 2023-04-15 Frequency: Biweekly  Progress: 60 Modality: individual  Related Interventions Assist the client in developing skills that increase the likelihood of deriving pleasure from behavioral activation (e.g., assertiveness skills, developing an exercise plan, less internal/more external focus, increased social involvement); reinforce success. Engage the client in "behavioral activation," increasing his/her activity level and contact with sources of reward, while identifying processes that inhibit activation. use behavioral techniques such as instruction, rehearsal, role-playing, role reversal, as needed, to facilitate activity in the client's daily life; reinforce success. 3. Develop healthy interpersonal relationships that lead to the alleviation and help prevent the relapse of depression. 4. Develop healthy thinking patterns and beliefs about self, others, and the world that lead to the  alleviation and help  prevent the relapse of depression. 5. Enhance ability to effectively cope with the full variety of life's worries and anxieties. 6. Learn and implement coping skills that result in a reduction of anxiety and worry, and improved daily functioning. Objective Learn and implement problem-solving strategies for realistically addressing worries. Target Date: 2023-04-15 Frequency: Biweekly  Progress: 60 Modality: individual  Related Interventions Assign the client a homework exercise in which he/she problem-solves a current problem (see Mastery of Your Anxiety and Worry: Workbook by Adora Fridge and Eliot Ford or Generalized Anxiety Disorder by Eather Colas, and Eliot Ford); review, reinforce success, and provide corrective feedback toward improvement. Teach the client problem-solving strategies involving specifically defining a problem, generating options for addressing it, evaluating the pros and cons of each option, selecting and implementing an optional action, and reevaluating and refining the action. Objective Learn and implement calming skills to reduce overall anxiety and manage anxiety symptoms. Target Date: 2023-04-15 Frequency: Biweekly  Progress: 60 Modality: individual  Related Interventions Assign the client to read about progressive muscle relaxation and other calming strategies in relevant books or treatment manuals (e.g., Progressive Relaxation Training by Leroy Kennedy; Mastery of Your Anxiety and Worry: Workbook by Beckie Busing). Assign the client homework each session in which he/she practices relaxation exercises daily, gradually applying them progressively from non-anxiety-provoking to anxiety-provoking situations; review and reinforce success while providing corrective feedback toward improvement. Teach the client calming/relaxation skills (e.g., applied relaxation, progressive muscle relaxation, cue controlled relaxation; mindful breathing; biofeedback) and how to discriminate  better between relaxation and tension; teach the client how to apply these skills to his/her daily life. 7. Recognize, accept, and cope with feelings of depression. 8. Reduce overall frequency, intensity, and duration of the anxiety so that daily functioning is not impaired. 9. Resolve the core conflict that is the source of anxiety. 10. Stabilize anxiety level while increasing ability to function on a daily basis. Diagnosis F43.23  Conditions For Discharge Achievement of treatment goals and objectives   Clint Bolder, LCSW

## 2022-09-15 ENCOUNTER — Ambulatory Visit (INDEPENDENT_AMBULATORY_CARE_PROVIDER_SITE_OTHER): Payer: Self-pay | Admitting: Psychology

## 2022-09-15 DIAGNOSIS — F4323 Adjustment disorder with mixed anxiety and depressed mood: Secondary | ICD-10-CM

## 2022-09-15 NOTE — Progress Notes (Signed)
[facility] Counselor/Therapist Progress Note  Patient ID: Jonathon Middleton, MRN: [id number],    Date: 09/15/2022  Time Spent: 12:00pm - 12:55pm    55 minutes   Treatment Type: Individual Therapy  Reported Symptoms: stress  Mental Status Exam: Appearance:  Casual     Behavior: Appropriate  Motor: Normal  Speech/Language:  Normal Rate  Affect: Appropriate  Mood: normal  Thought process: normal  Thought content:   WNL  Sensory/Perceptual disturbances:   WNL  Orientation: oriented to person, place, time/date, and situation  Attention: Good  Concentration: Good  Memory: WNL  Fund of knowledge:  Good  Insight:   Good  Judgment:  Good  Impulse Control: Good   Risk Assessment: Danger to Self:  No Self-injurious Behavior: No Danger to Others: No Duty to Warn:no Physical Aggression / Violence:No  Access to Firearms a concern: No  Gang Involvement:No   Subjective: Pt present for face-to-face individual therapy via video Webex.  Pt consents to telehealth video session due to COVID 19 pandemic. Location of pt: home Location of therapist: home office. Pt talked about feeling overwhelmed.  It has been difficult for him to get out of his negative mindset.   Pt talked about feeling anxiety about aging.  He turns 71 on October 30th.  Pt has written in his journal about his thoughts and feelings.   He still is wrestling with his identity.  Pt has been overthinking a lot.   Pt has been thinking about his childhood and the death of his father.   Addressed pt's thoughts and feelings.  Pt states he has lived in fear since his father's death.  Pt talked about his son who is thinking about moving in with him.   The arrangement would be to mutually help each other out.  Worked with pt on present moment mindfulness strategies.   Worked on additional self affirmation strategies.    Provided supportive therapy.    Interventions: Cognitive Behavioral Therapy and  Insight-Oriented  Diagnosis:  F43.23  Plan: Plan of Care: Recommend ongoing therapy.  Pt participated in setting treatment goals.  Plan to continue to meet every two weeks.   Treatment Plan Client Abilities/Strengths  Pt is bright, engaging, and motivated for therapy.  Client Treatment Preferences  Individual therapy.  Client Statement of Needs  Improve copings skills and understand herself better. Improve self esteem.  Symptoms  Depressed or irritable mood. Excessive and/or unrealistic worry that is difficult to control occurring more days than not for at least 6 months about a number of events or activities. Hypervigilance (e.g., feeling constantly on edge, experiencing concentration difficulties, having trouble falling or staying asleep, exhibiting a general state of irritability). Low self-esteem. Problems Addressed  Unipolar Depression, Anxiety Goals 1. Alleviate depressive symptoms and return to previous level of effective functioning. 2. Appropriately grieve the loss in order to normalize mood and to return to previously adaptive level of functioning. Objective Learn and implement behavioral strategies to overcome depression. Target Date: 2023-04-15 Frequency: Biweekly  Progress: 60 Modality: individual  Related Interventions Assist the client in developing skills that increase the likelihood of deriving pleasure from behavioral activation (e.g., assertiveness skills, developing an exercise plan, less internal/more external focus, increased social involvement); reinforce success. Engage the client in "behavioral activation," increasing his/her activity level and contact with sources of reward, while identifying processes that inhibit activation. use behavioral techniques such as instruction, rehearsal, role-playing, role reversal, as needed, to facilitate activity in the client's daily life; reinforce success. 3.  Develop healthy interpersonal relationships that lead to the  alleviation and help prevent the relapse of depression. 4. Develop healthy thinking patterns and beliefs about self, others, and the world that lead to the alleviation and help prevent the relapse of depression. 5. Enhance ability to effectively cope with the full variety of life's worries and anxieties. 6. Learn and implement coping skills that result in a reduction of anxiety and worry, and improved daily functioning. Objective Learn and implement problem-solving strategies for realistically addressing worries. Target Date: 2023-04-15 Frequency: Biweekly  Progress: 60 Modality: individual  Related Interventions Assign the client a homework exercise in which he/she problem-solves a current problem (see Mastery of Your Anxiety and Worry: Workbook by Adora Fridge and Eliot Ford or Generalized Anxiety Disorder by Eather Colas, and Eliot Ford); review, reinforce success, and provide corrective feedback toward improvement. Teach the client problem-solving strategies involving specifically defining a problem, generating options for addressing it, evaluating the pros and cons of each option, selecting and implementing an optional action, and reevaluating and refining the action. Objective Learn and implement calming skills to reduce overall anxiety and manage anxiety symptoms. Target Date: 2023-04-15 Frequency: Biweekly  Progress: 60 Modality: individual  Related Interventions Assign the client to read about progressive muscle relaxation and other calming strategies in relevant books or treatment manuals (e.g., Progressive Relaxation Training by Leroy Kennedy; Mastery of Your Anxiety and Worry: Workbook by Beckie Busing). Assign the client homework each session in which he/she practices relaxation exercises daily, gradually applying them progressively from non-anxiety-provoking to anxiety-provoking situations; review and reinforce success while providing corrective feedback toward improvement. Teach the  client calming/relaxation skills (e.g., applied relaxation, progressive muscle relaxation, cue controlled relaxation; mindful breathing; biofeedback) and how to discriminate better between relaxation and tension; teach the client how to apply these skills to his/her daily life. 7. Recognize, accept, and cope with feelings of depression. 8. Reduce overall frequency, intensity, and duration of the anxiety so that daily functioning is not impaired. 9. Resolve the core conflict that is the source of anxiety. 10. Stabilize anxiety level while increasing ability to function on a daily basis. Diagnosis F43.23  Conditions For Discharge Achievement of treatment goals and objectives   Clint Bolder, LCSW

## 2022-09-29 ENCOUNTER — Ambulatory Visit (INDEPENDENT_AMBULATORY_CARE_PROVIDER_SITE_OTHER): Payer: Self-pay | Admitting: Psychology

## 2022-09-29 DIAGNOSIS — F4323 Adjustment disorder with mixed anxiety and depressed mood: Secondary | ICD-10-CM

## 2022-09-29 NOTE — Progress Notes (Signed)
[facility] Counselor/Therapist Progress Note  Patient ID: Jonathon Middleton, MRN: [id number],    Date: 09/29/2022  Time Spent: 12:00pm - 12:55pm    55 minutes   Treatment Type: Individual Therapy  Reported Symptoms: stress  Mental Status Exam: Appearance:  Casual     Behavior: Appropriate  Motor: Normal  Speech/Language:  Normal Rate  Affect: Appropriate  Mood: normal  Thought process: normal  Thought content:   WNL  Sensory/Perceptual disturbances:   WNL  Orientation: oriented to person, place, time/date, and situation  Attention: Good  Concentration: Good  Memory: WNL  Fund of knowledge:  Good  Insight:   Good  Judgment:  Good  Impulse Control: Good   Risk Assessment: Danger to Self:  No Self-injurious Behavior: No Danger to Others: No Duty to Warn:no Physical Aggression / Violence:No  Access to Firearms a concern: No  Gang Involvement:No   Subjective: Pt present for face-to-face individual therapy via video Webex.  Pt consents to telehealth video session due to COVID 19 pandemic. Location of pt: home Location of therapist: home office. Pt talked about his friend Timmothy Sours coming to visit this weekend.  He is looking forward to the visit.  They will celebrate pt's birthday which is [birth year].   Pt states he has been exploring his issues with procrastination.  He has discovered that it is "trauma and childhood related".  The trauma was the death of his father.  Helped pt process the issues and his feelings.  Pt feels overwhelmed. Pt is giving himself a hard time for not being where he wants to be regarding progress personally and professional.   Addressed pt's expectations of himself and how they are unrealistic and too lofty at times.  Worked on self esteem issues.   Worked on additional self affirmation strategies.    Provided supportive therapy.    Interventions: Cognitive Behavioral Therapy and Insight-Oriented  Diagnosis:  F43.23  Plan: Plan of  Care: Recommend ongoing therapy.  Pt participated in setting treatment goals.  Plan to continue to meet every two weeks.   Treatment Plan Client Abilities/Strengths  Pt is bright, engaging, and motivated for therapy.  Client Treatment Preferences  Individual therapy.  Client Statement of Needs  Improve copings skills and understand herself better. Improve self esteem.  Symptoms  Depressed or irritable mood. Excessive and/or unrealistic worry that is difficult to control occurring more days than not for at least 6 months about a number of events or activities. Hypervigilance (e.g., feeling constantly on edge, experiencing concentration difficulties, having trouble falling or staying asleep, exhibiting a general state of irritability). Low self-esteem. Problems Addressed  Unipolar Depression, Anxiety Goals 1. Alleviate depressive symptoms and return to previous level of effective functioning. 2. Appropriately grieve the loss in order to normalize mood and to return to previously adaptive level of functioning. Objective Learn and implement behavioral strategies to overcome depression. Target Date: 2023-04-15 Frequency: Biweekly  Progress: 60 Modality: individual  Related Interventions Assist the client in developing skills that increase the likelihood of deriving pleasure from behavioral activation (e.g., assertiveness skills, developing an exercise plan, less internal/more external focus, increased social involvement); reinforce success. Engage the client in "behavioral activation," increasing his/her activity level and contact with sources of reward, while identifying processes that inhibit activation. use behavioral techniques such as instruction, rehearsal, role-playing, role reversal, as needed, to facilitate activity in the client's daily life; reinforce success. 3. Develop healthy interpersonal relationships that lead to the alleviation and help prevent the relapse of  depression. 4.  Develop healthy thinking patterns and beliefs about self, others, and the world that lead to the alleviation and help prevent the relapse of depression. 5. Enhance ability to effectively cope with the full variety of life's worries and anxieties. 6. Learn and implement coping skills that result in a reduction of anxiety and worry, and improved daily functioning. Objective Learn and implement problem-solving strategies for realistically addressing worries. Target Date: 2023-04-15 Frequency: Biweekly  Progress: 60 Modality: individual  Related Interventions Assign the client a homework exercise in which he/she problem-solves a current problem (see Mastery of Your Anxiety and Worry: Workbook by Elenora Fender and Filbert Schilder or Generalized Anxiety Disorder by Elesa Hacker, and Filbert Schilder); review, reinforce success, and provide corrective feedback toward improvement. Teach the client problem-solving strategies involving specifically defining a problem, generating options for addressing it, evaluating the pros and cons of each option, selecting and implementing an optional action, and reevaluating and refining the action. Objective Learn and implement calming skills to reduce overall anxiety and manage anxiety symptoms. Target Date: 2023-04-15 Frequency: Biweekly  Progress: 60 Modality: individual  Related Interventions Assign the client to read about progressive muscle relaxation and other calming strategies in relevant books or treatment manuals (e.g., Progressive Relaxation Training by Twana First; Mastery of Your Anxiety and Worry: Workbook by Earlie Counts). Assign the client homework each session in which he/she practices relaxation exercises daily, gradually applying them progressively from non-anxiety-provoking to anxiety-provoking situations; review and reinforce success while providing corrective feedback toward improvement. Teach the client calming/relaxation skills (e.g., applied relaxation,  progressive muscle relaxation, cue controlled relaxation; mindful breathing; biofeedback) and how to discriminate better between relaxation and tension; teach the client how to apply these skills to his/her daily life. 7. Recognize, accept, and cope with feelings of depression. 8. Reduce overall frequency, intensity, and duration of the anxiety so that daily functioning is not impaired. 9. Resolve the core conflict that is the source of anxiety. 10. Stabilize anxiety level while increasing ability to function on a daily basis. Diagnosis F43.23  Conditions For Discharge Achievement of treatment goals and objectives   Salomon Fick, LCSW

## 2022-10-13 ENCOUNTER — Ambulatory Visit (INDEPENDENT_AMBULATORY_CARE_PROVIDER_SITE_OTHER): Payer: Self-pay | Admitting: Psychology

## 2022-10-13 DIAGNOSIS — F4323 Adjustment disorder with mixed anxiety and depressed mood: Secondary | ICD-10-CM

## 2022-10-13 NOTE — Progress Notes (Signed)
[facility] Counselor/Therapist Progress Note  Patient ID: Jonathon Middleton, MRN: [id number],    Date: 10/13/2022  Time Spent: 12:00pm - 12:55pm    55 minutes   Treatment Type: Individual Therapy  Reported Symptoms: stress  Mental Status Exam: Appearance:  Casual     Behavior: Appropriate  Motor: Normal  Speech/Language:  Normal Rate  Affect: Appropriate  Mood: normal  Thought process: normal  Thought content:   WNL  Sensory/Perceptual disturbances:   WNL  Orientation: oriented to person, place, time/date, and situation  Attention: Good  Concentration: Good  Memory: WNL  Fund of knowledge:  Good  Insight:   Good  Judgment:  Good  Impulse Control: Good   Risk Assessment: Danger to Self:  No Self-injurious Behavior: No Danger to Others: No Duty to Warn:no Physical Aggression / Violence:No  Access to Firearms a concern: No  Gang Involvement:No   Subjective: Pt present for face-to-face individual therapy via video Webex.  Pt consents to telehealth video session due to COVID 19 pandemic. Location of pt: home Location of therapist: home office. Pt talked about celebrating his 71th birthday.   His best friend and family celebrated with him.   Pt talked about his friend Jonathon Middleton who had a stroke last year.   Jonathon Middleton has been depressed the past year and his doctor recommended that he see a therapist.  Pt is supporting his friend and encouraging him to go to therapy.   Addressed pt's concerns about his friend.   Pt has been reflecting on relationships.  Pt has not seen himself as having value.  Pt has struggled all his life to get approval and acceptance from others.   Worked on self esteem issues.   Worked on additional self affirmation strategies.    Provided supportive therapy.    Interventions: Cognitive Behavioral Therapy and Insight-Oriented  Diagnosis:  F43.23  Plan: Plan of Care: Recommend ongoing therapy.  Pt participated in setting treatment goals.  Plan  to continue to meet every two weeks.   Treatment Plan Client Abilities/Strengths  Pt is bright, engaging, and motivated for therapy.  Client Treatment Preferences  Individual therapy.  Client Statement of Needs  Improve copings skills and understand herself better. Improve self esteem.  Symptoms  Depressed or irritable mood. Excessive and/or unrealistic worry that is difficult to control occurring more days than not for at least 6 months about a number of events or activities. Hypervigilance (e.g., feeling constantly on edge, experiencing concentration difficulties, having trouble falling or staying asleep, exhibiting a general state of irritability). Low self-esteem. Problems Addressed  Unipolar Depression, Anxiety Goals 1. Alleviate depressive symptoms and return to previous level of effective functioning. 2. Appropriately grieve the loss in order to normalize mood and to return to previously adaptive level of functioning. Objective Learn and implement behavioral strategies to overcome depression. Target Date: 2023-04-15 Frequency: Biweekly  Progress: 60 Modality: individual  Related Interventions Assist the client in developing skills that increase the likelihood of deriving pleasure from behavioral activation (e.g., assertiveness skills, developing an exercise plan, less internal/more external focus, increased social involvement); reinforce success. Engage the client in "behavioral activation," increasing his/her activity level and contact with sources of reward, while identifying processes that inhibit activation. use behavioral techniques such as instruction, rehearsal, role-playing, role reversal, as needed, to facilitate activity in the client's daily life; reinforce success. 3. Develop healthy interpersonal relationships that lead to the alleviation and help prevent the relapse of depression. 4. Develop healthy thinking patterns and beliefs about  self, others, and the world that lead  to the alleviation and help prevent the relapse of depression. 5. Enhance ability to effectively cope with the full variety of life's worries and anxieties. 6. Learn and implement coping skills that result in a reduction of anxiety and worry, and improved daily functioning. Objective Learn and implement problem-solving strategies for realistically addressing worries. Target Date: 2023-04-15 Frequency: Biweekly  Progress: 60 Modality: individual  Related Interventions Assign the client a homework exercise in which he/she problem-solves a current problem (see Mastery of Your Anxiety and Worry: Workbook by Elenora Fender and Filbert Schilder or Generalized Anxiety Disorder by Elesa Hacker, and Filbert Schilder); review, reinforce success, and provide corrective feedback toward improvement. Teach the client problem-solving strategies involving specifically defining a problem, generating options for addressing it, evaluating the pros and cons of each option, selecting and implementing an optional action, and reevaluating and refining the action. Objective Learn and implement calming skills to reduce overall anxiety and manage anxiety symptoms. Target Date: 2023-04-15 Frequency: Biweekly  Progress: 60 Modality: individual  Related Interventions Assign the client to read about progressive muscle relaxation and other calming strategies in relevant books or treatment manuals (e.g., Progressive Relaxation Training by Twana First; Mastery of Your Anxiety and Worry: Workbook by Earlie Counts). Assign the client homework each session in which he/she practices relaxation exercises daily, gradually applying them progressively from non-anxiety-provoking to anxiety-provoking situations; review and reinforce success while providing corrective feedback toward improvement. Teach the client calming/relaxation skills (e.g., applied relaxation, progressive muscle relaxation, cue controlled relaxation; mindful breathing; biofeedback)  and how to discriminate better between relaxation and tension; teach the client how to apply these skills to his/her daily life. 7. Recognize, accept, and cope with feelings of depression. 8. Reduce overall frequency, intensity, and duration of the anxiety so that daily functioning is not impaired. 9. Resolve the core conflict that is the source of anxiety. 10. Stabilize anxiety level while increasing ability to function on a daily basis. Diagnosis F43.23  Conditions For Discharge Achievement of treatment goals and objectives   Salomon Fick, LCSW

## 2022-10-26 ENCOUNTER — Ambulatory Visit (INDEPENDENT_AMBULATORY_CARE_PROVIDER_SITE_OTHER): Payer: Self-pay | Admitting: Psychology

## 2022-10-26 DIAGNOSIS — F4323 Adjustment disorder with mixed anxiety and depressed mood: Secondary | ICD-10-CM

## 2022-10-26 NOTE — Progress Notes (Signed)
[facility] Counselor/Therapist Progress Note  Patient ID: Jonathon Middleton, MRN: [id number],    Date: 10/26/2022  Time Spent: 12:00pm - 12:55pm    55 minutes   Treatment Type: Individual Therapy  Reported Symptoms: stress  Mental Status Exam: Appearance:  Casual     Behavior: Appropriate  Motor: Normal  Speech/Language:  Normal Rate  Affect: Appropriate  Mood: normal  Thought process: normal  Thought content:   WNL  Sensory/Perceptual disturbances:   WNL  Orientation: oriented to person, place, time/date, and situation  Attention: Good  Concentration: Good  Memory: WNL  Fund of knowledge:  Good  Insight:   Good  Judgment:  Good  Impulse Control: Good   Risk Assessment: Danger to Self:  No Self-injurious Behavior: No Danger to Others: No Duty to Warn:no Physical Aggression / Violence:No  Access to Firearms a concern: No  Gang Involvement:No   Subjective: Pt present for face-to-face individual therapy via video Webex.  Pt consents to telehealth video session due to COVID 19 pandemic. Location of pt: home Location of therapist: home office. Pt talked about feeling like he has been on an emotional roller coaster.  Addressed what triggers pt's down times.  He identified that his expectations not being met has been a trigger.  Pt talked about work.  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  Addressed the issues and frustrations.  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    Pt has been reading about trust.  Pt states the "magic that he is looking for is in the work he is avoiding".    Pt has been thinking about his future and that he may never be in another romantic relationship again.  He misses touch and connection.   Worked on self esteem issues.   Worked on additional self affirmation strategies.    Pt talked about the holidays.  He will be spending Thanksgiving alone but he is  ok with that.   Provided supportive therapy.    Interventions: Cognitive Behavioral Therapy and Insight-Oriented  Diagnosis:  F43.23  Plan: Plan of Care: Recommend ongoing therapy.  Pt participated in setting treatment goals.  Plan to continue to meet every two weeks.   Treatment Plan Client Abilities/Strengths  Pt is bright, engaging, and motivated for therapy.  Client Treatment Preferences  Individual therapy.  Client Statement of Needs  Improve copings skills and understand herself better. Improve self esteem.  Symptoms  Depressed or irritable mood. Excessive and/or unrealistic worry that is difficult to control occurring more days than not for at least 6 months about a number of events or activities. Hypervigilance (e.g., feeling constantly on edge, experiencing concentration difficulties, having trouble falling or staying asleep, exhibiting a general state of irritability). Low self-esteem. Problems Addressed  Unipolar Depression, Anxiety Goals 1. Alleviate depressive symptoms and return to previous level of effective functioning. 2. Appropriately grieve the loss in order to normalize mood and to return to previously adaptive level of functioning. Objective Learn and implement behavioral strategies to overcome depression. Target Date: 2023-04-15 Frequency: Biweekly  Progress: 60 Modality: individual  Related Interventions Assist the client in developing skills that increase the likelihood of deriving pleasure from behavioral activation (e.g., assertiveness skills, developing an exercise plan, less internal/more external focus, increased social involvement); reinforce success. Engage the client in "behavioral activation," increasing his/her activity level and contact with sources of reward, while identifying processes that inhibit activation.  use behavioral techniques such as instruction, rehearsal, role-playing, role reversal, as needed, to facilitate activity in the client's daily  life; reinforce success. 3. Develop healthy interpersonal relationships that lead to the alleviation and help prevent the relapse of depression. 4. Develop healthy thinking patterns and beliefs about self, others, and the world that lead to the alleviation and help prevent the relapse of depression. 5. Enhance ability to effectively cope with the full variety of life's worries and anxieties. 6. Learn and implement coping skills that result in a reduction of anxiety and worry, and improved daily functioning. Objective Learn and implement problem-solving strategies for realistically addressing worries. Target Date: 2023-04-15 Frequency: Biweekly  Progress: 60 Modality: individual  Related Interventions Assign the client a homework exercise in which he/she problem-solves a current problem (see Mastery of Your Anxiety and Worry: Workbook by Adora Fridge and Eliot Ford or Generalized Anxiety Disorder by Eather Colas, and Eliot Ford); review, reinforce success, and provide corrective feedback toward improvement. Teach the client problem-solving strategies involving specifically defining a problem, generating options for addressing it, evaluating the pros and cons of each option, selecting and implementing an optional action, and reevaluating and refining the action. Objective Learn and implement calming skills to reduce overall anxiety and manage anxiety symptoms. Target Date: 2023-04-15 Frequency: Biweekly  Progress: 60 Modality: individual  Related Interventions Assign the client to read about progressive muscle relaxation and other calming strategies in relevant books or treatment manuals (e.g., Progressive Relaxation Training by Leroy Kennedy; Mastery of Your Anxiety and Worry: Workbook by Beckie Busing). Assign the client homework each session in which he/she practices relaxation exercises daily, gradually applying them progressively from non-anxiety-provoking to anxiety-provoking situations;  review and reinforce success while providing corrective feedback toward improvement. Teach the client calming/relaxation skills (e.g., applied relaxation, progressive muscle relaxation, cue controlled relaxation; mindful breathing; biofeedback) and how to discriminate better between relaxation and tension; teach the client how to apply these skills to his/her daily life. 7. Recognize, accept, and cope with feelings of depression. 8. Reduce overall frequency, intensity, and duration of the anxiety so that daily functioning is not impaired. 9. Resolve the core conflict that is the source of anxiety. 10. Stabilize anxiety level while increasing ability to function on a daily basis. Diagnosis F43.23  Conditions For Discharge Achievement of treatment goals and objectives   Clint Bolder, LCSW

## 2022-11-10 ENCOUNTER — Ambulatory Visit (INDEPENDENT_AMBULATORY_CARE_PROVIDER_SITE_OTHER): Payer: Self-pay | Admitting: Psychology

## 2022-11-10 DIAGNOSIS — F4323 Adjustment disorder with mixed anxiety and depressed mood: Secondary | ICD-10-CM

## 2022-11-10 NOTE — Progress Notes (Signed)
[facility] Counselor/Therapist Progress Note  Patient ID: Jonathon Middleton, MRN: [id number],    Date: 11/10/2022  Time Spent: 12:00pm - 12:55pm    55 minutes   Treatment Type: Individual Therapy  Reported Symptoms: stress  Mental Status Exam: Appearance:  Casual     Behavior: Appropriate  Motor: Normal  Speech/Language:  Normal Rate  Affect: Appropriate  Mood: normal  Thought process: normal  Thought content:   WNL  Sensory/Perceptual disturbances:   WNL  Orientation: oriented to person, place, time/date, and situation  Attention: Good  Concentration: Good  Memory: WNL  Fund of knowledge:  Good  Insight:   Good  Judgment:  Good  Impulse Control: Good   Risk Assessment: Danger to Self:  No Self-injurious Behavior: No Danger to Others: No Duty to Warn:no Physical Aggression / Violence:No  Access to Firearms a concern: No  Gang Involvement:No   Subjective: Pt present for face-to-face individual therapy via video Webex.  Pt consents to telehealth video session due to COVID 19 pandemic. Location of pt: home Location of therapist: home office. Pt talked about Thanksgiving.  He had Thanksgiving dinner with neighbors.  Pt enjoyed his time there.   Pt talked about work.  He is not making the money he needs to make and feels a lot of stress bc he is working so hard.  Pt feels like he is in a slump.   Addressed how he can take care of himself through this time.   Encouraged him not to globalize the experience and to adjust his expectations of himself.   Worked on self esteem issues.     Provided supportive therapy.    Interventions: Cognitive Behavioral Therapy and Insight-Oriented  Diagnosis:  F43.23  Plan: Plan of Care: Recommend ongoing therapy.  Pt participated in setting treatment goals.  Plan to continue to meet every two weeks.   Treatment Plan Client Abilities/Strengths  Pt is bright, engaging, and motivated for therapy.  Client Treatment  Preferences  Individual therapy.  Client Statement of Needs  Improve copings skills and understand herself better. Improve self esteem.  Symptoms  Depressed or irritable mood. Excessive and/or unrealistic worry that is difficult to control occurring more days than not for at least 6 months about a number of events or activities. Hypervigilance (e.g., feeling constantly on edge, experiencing concentration difficulties, having trouble falling or staying asleep, exhibiting a general state of irritability). Low self-esteem. Problems Addressed  Unipolar Depression, Anxiety Goals 1. Alleviate depressive symptoms and return to previous level of effective functioning. 2. Appropriately grieve the loss in order to normalize mood and to return to previously adaptive level of functioning. Objective Learn and implement behavioral strategies to overcome depression. Target Date: 2023-04-15 Frequency: Biweekly  Progress: 60 Modality: individual  Related Interventions Assist the client in developing skills that increase the likelihood of deriving pleasure from behavioral activation (e.g., assertiveness skills, developing an exercise plan, less internal/more external focus, increased social involvement); reinforce success. Engage the client in "behavioral activation," increasing his/her activity level and contact with sources of reward, while identifying processes that inhibit activation. use behavioral techniques such as instruction, rehearsal, role-playing, role reversal, as needed, to facilitate activity in the client's daily life; reinforce success. 3. Develop healthy interpersonal relationships that lead to the alleviation and help prevent the relapse of depression. 4. Develop healthy thinking patterns and beliefs about self, others, and the world that lead to the alleviation and help prevent the relapse of depression. 5. Enhance ability to effectively cope with  the full variety of life's worries and  anxieties. 6. Learn and implement coping skills that result in a reduction of anxiety and worry, and improved daily functioning. Objective Learn and implement problem-solving strategies for realistically addressing worries. Target Date: 2023-04-15 Frequency: Biweekly  Progress: 60 Modality: individual  Related Interventions Assign the client a homework exercise in which he/she problem-solves a current problem (see Mastery of Your Anxiety and Worry: Workbook by Elenora Fender and Filbert Schilder or Generalized Anxiety Disorder by Elesa Hacker, and Filbert Schilder); review, reinforce success, and provide corrective feedback toward improvement. Teach the client problem-solving strategies involving specifically defining a problem, generating options for addressing it, evaluating the pros and cons of each option, selecting and implementing an optional action, and reevaluating and refining the action. Objective Learn and implement calming skills to reduce overall anxiety and manage anxiety symptoms. Target Date: 2023-04-15 Frequency: Biweekly  Progress: 60 Modality: individual  Related Interventions Assign the client to read about progressive muscle relaxation and other calming strategies in relevant books or treatment manuals (e.g., Progressive Relaxation Training by Twana First; Mastery of Your Anxiety and Worry: Workbook by Earlie Counts). Assign the client homework each session in which he/she practices relaxation exercises daily, gradually applying them progressively from non-anxiety-provoking to anxiety-provoking situations; review and reinforce success while providing corrective feedback toward improvement. Teach the client calming/relaxation skills (e.g., applied relaxation, progressive muscle relaxation, cue controlled relaxation; mindful breathing; biofeedback) and how to discriminate better between relaxation and tension; teach the client how to apply these skills to his/her daily life. 7. Recognize,  accept, and cope with feelings of depression. 8. Reduce overall frequency, intensity, and duration of the anxiety so that daily functioning is not impaired. 9. Resolve the core conflict that is the source of anxiety. 10. Stabilize anxiety level while increasing ability to function on a daily basis. Diagnosis F43.23  Conditions For Discharge Achievement of treatment goals and objectives   Salomon Fick, LCSW

## 2022-11-24 ENCOUNTER — Ambulatory Visit (INDEPENDENT_AMBULATORY_CARE_PROVIDER_SITE_OTHER): Payer: Self-pay | Admitting: Psychology

## 2022-11-24 DIAGNOSIS — F4323 Adjustment disorder with mixed anxiety and depressed mood: Secondary | ICD-10-CM

## 2022-11-24 NOTE — Progress Notes (Signed)
[facility] Counselor/Therapist Progress Note  Patient ID: Jonathon Middleton, MRN: [id number],    Date: 11/24/2022  Time Spent: 12:00pm - 12:55pm    55 minutes   Treatment Type: Individual Therapy  Reported Symptoms: stress  Mental Status Exam: Appearance:  Casual     Behavior: Appropriate  Motor: Normal  Speech/Language:  Normal Rate  Affect: Appropriate  Mood: normal  Thought process: normal  Thought content:   WNL  Sensory/Perceptual disturbances:   WNL  Orientation: oriented to person, place, time/date, and situation  Attention: Good  Concentration: Good  Memory: WNL  Fund of knowledge:  Good  Insight:   Good  Judgment:  Good  Impulse Control: Good   Risk Assessment: Danger to Self:  No Self-injurious Behavior: No Danger to Others: No Duty to Warn:no Physical Aggression / Violence:No  Access to Firearms a concern: No  Gang Involvement:No   Subjective: Pt present for face-to-face individual therapy via video Webex.  Pt consents to telehealth video session due to COVID 19 pandemic. Location of pt: home Location of therapist: home office. 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   The holidays are also a difficult time of year.  Helped pt process his feelings.  Pt talked about not sleeping well.   He is not happy and is not sure if he needs to adjust his expectations.  Addressed how hard pt is on himself.  Worked on self esteem issues.     Pt talked about plans for his son and girlfriend to move in with him next year.   Pt is feeling positive about this and well cared for but there are a lot of details to work out.  Provided supportive therapy.    Interventions: Cognitive Behavioral Therapy and Insight-Oriented  Diagnosis:  F43.23  Plan: Plan of Care: Recommend ongoing therapy.  Pt participated in setting treatment goals.  Plan to continue to meet every two weeks.   Treatment Plan Client Abilities/Strengths   Pt is bright, engaging, and motivated for therapy.  Client Treatment Preferences  Individual therapy.  Client Statement of Needs  Improve copings skills and understand herself better. Improve self esteem.  Symptoms  Depressed or irritable mood. Excessive and/or unrealistic worry that is difficult to control occurring more days than not for at least 6 months about a number of events or activities. Hypervigilance (e.g., feeling constantly on edge, experiencing concentration difficulties, having trouble falling or staying asleep, exhibiting a general state of irritability). Low self-esteem. Problems Addressed  Unipolar Depression, Anxiety Goals 1. Alleviate depressive symptoms and return to previous level of effective functioning. 2. Appropriately grieve the loss in order to normalize mood and to return to previously adaptive level of functioning. Objective Learn and implement behavioral strategies to overcome depression. Target Date: 2023-04-15 Frequency: Biweekly  Progress: 60 Modality: individual  Related Interventions Assist the client in developing skills that increase the likelihood of deriving pleasure from behavioral activation (e.g., assertiveness skills, developing an exercise plan, less internal/more external focus, increased social involvement); reinforce success. Engage the client in "behavioral activation," increasing his/her activity level and contact with sources of reward, while identifying processes that inhibit activation. use behavioral techniques such as instruction, rehearsal, role-playing, role reversal, as needed, to facilitate activity in the client's daily life; reinforce success. 3. Develop healthy interpersonal relationships that lead to the alleviation and help prevent the relapse of depression. 4. Develop healthy thinking patterns and beliefs about self, others, and the world that  lead to the alleviation and help prevent the relapse of depression. 5. Enhance ability  to effectively cope with the full variety of life's worries and anxieties. 6. Learn and implement coping skills that result in a reduction of anxiety and worry, and improved daily functioning. Objective Learn and implement problem-solving strategies for realistically addressing worries. Target Date: 2023-04-15 Frequency: Biweekly  Progress: 60 Modality: individual  Related Interventions Assign the client a homework exercise in which he/she problem-solves a current problem (see Mastery of Your Anxiety and Worry: Workbook by Elenora Fender and Filbert Schilder or Generalized Anxiety Disorder by Elesa Hacker, and Filbert Schilder); review, reinforce success, and provide corrective feedback toward improvement. Teach the client problem-solving strategies involving specifically defining a problem, generating options for addressing it, evaluating the pros and cons of each option, selecting and implementing an optional action, and reevaluating and refining the action. Objective Learn and implement calming skills to reduce overall anxiety and manage anxiety symptoms. Target Date: 2023-04-15 Frequency: Biweekly  Progress: 60 Modality: individual  Related Interventions Assign the client to read about progressive muscle relaxation and other calming strategies in relevant books or treatment manuals (e.g., Progressive Relaxation Training by Twana First; Mastery of Your Anxiety and Worry: Workbook by Earlie Counts). Assign the client homework each session in which he/she practices relaxation exercises daily, gradually applying them progressively from non-anxiety-provoking to anxiety-provoking situations; review and reinforce success while providing corrective feedback toward improvement. Teach the client calming/relaxation skills (e.g., applied relaxation, progressive muscle relaxation, cue controlled relaxation; mindful breathing; biofeedback) and how to discriminate better between relaxation and tension; teach the client  how to apply these skills to his/her daily life. 7. Recognize, accept, and cope with feelings of depression. 8. Reduce overall frequency, intensity, and duration of the anxiety so that daily functioning is not impaired. 9. Resolve the core conflict that is the source of anxiety. 10. Stabilize anxiety level while increasing ability to function on a daily basis. Diagnosis F43.23  Conditions For Discharge Achievement of treatment goals and objectives   Salomon Fick, LCSW

## 2022-12-08 ENCOUNTER — Ambulatory Visit (INDEPENDENT_AMBULATORY_CARE_PROVIDER_SITE_OTHER): Payer: Self-pay | Admitting: Psychology

## 2022-12-08 DIAGNOSIS — F4323 Adjustment disorder with mixed anxiety and depressed mood: Secondary | ICD-10-CM

## 2022-12-08 NOTE — Progress Notes (Signed)
[place] Counselor/Therapist Progress Note  Patient ID: Jonathon Middleton, MRN: [id number],    Date: 12/08/2022  Time Spent: 12:00pm - 12:55pm    55 minutes   Treatment Type: Individual Therapy  Reported Symptoms: stress  Mental Status Exam: Appearance:  Casual     Behavior: Appropriate  Motor: Normal  Speech/Language:  Normal Rate  Affect: Appropriate  Mood: normal  Thought process: normal  Thought content:   WNL  Sensory/Perceptual disturbances:   WNL  Orientation: oriented to person, place, time/date, and situation  Attention: Good  Concentration: Good  Memory: WNL  Fund of knowledge:  Good  Insight:   Good  Judgment:  Good  Impulse Control: Good   Risk Assessment: Danger to Self:  No Self-injurious Behavior: No Danger to Others: No Duty to Warn:no Physical Aggression / Violence:No  Access to Firearms a concern: No  Gang Involvement:No   Subjective: Pt present for face-to-face individual therapy via video Webex.  Pt consents to telehealth video session due to COVID 19 pandemic. Location of pt: home Location of therapist: home office. Pt talked about the holidays.  He spent some time Christmas day with his sons and grandchildren.   It was nice for him to be with family.   Pt talked about it being the new year.  Last year was a difficult year regarding work and earnings.   Pt is trying to focus on goals for 2024. Pt talked about work.   He is feeling bad about himself and comparing himself to his peers who were more successful.   Worked on self esteem issues.  Pt talked about feeling down as he looks at the year ahead of him.  Pt states he realized he lives in fear a lot.  Addressed pt's fears. Encouraged pt to journal about his fear thoughts.   Addressed if there are things he can look forward to,  He is looking forward to his son moving in with him in the spring.  Provided supportive therapy.    Interventions: Cognitive Behavioral Therapy and  Insight-Oriented  Diagnosis:  F43.23  Plan: Plan of Care: Recommend ongoing therapy.  Pt participated in setting treatment goals.  Plan to continue to meet every two weeks.   Treatment Plan (target date: 04/15/2023) Client Abilities/Strengths  Pt is bright, engaging, and motivated for therapy.  Client Treatment Preferences  Individual therapy.  Client Statement of Needs  Improve copings skills and understand herself better. Improve self esteem.  Symptoms  Depressed or irritable mood. Excessive and/or unrealistic worry that is difficult to control occurring more days than not for at least 6 months about a number of events or activities. Hypervigilance (e.g., feeling constantly on edge, experiencing concentration difficulties, having trouble falling or staying asleep, exhibiting a general state of irritability). Low self-esteem. Problems Addressed  Unipolar Depression, Anxiety Goals 1. Alleviate depressive symptoms and return to previous level of effective functioning. 2. Appropriately grieve the loss in order to normalize mood and to return to previously adaptive level of functioning. Objective Learn and implement behavioral strategies to overcome depression. Target Date: 2023-04-15 Frequency: Biweekly  Progress: 60 Modality: individual  Related Interventions Assist the client in developing skills that increase the likelihood of deriving pleasure from behavioral activation (e.g., assertiveness skills, developing an exercise plan, less internal/more external focus, increased social involvement); reinforce success. Engage the client in "behavioral activation," increasing his/her activity level and contact with sources of reward, while identifying processes that inhibit activation. use behavioral techniques such as instruction, rehearsal, role-playing,  role reversal, as needed, to facilitate activity in the client's daily life; reinforce success. 3. Develop healthy interpersonal relationships  that lead to the alleviation and help prevent the relapse of depression. 4. Develop healthy thinking patterns and beliefs about self, others, and the world that lead to the alleviation and help prevent the relapse of depression. 5. Enhance ability to effectively cope with the full variety of life's worries and anxieties. 6. Learn and implement coping skills that result in a reduction of anxiety and worry, and improved daily functioning. Objective Learn and implement problem-solving strategies for realistically addressing worries. Target Date: 2023-04-15 Frequency: Biweekly  Progress: 60 Modality: individual  Related Interventions Assign the client a homework exercise in which he/she problem-solves a current problem (see Mastery of Your Anxiety and Worry: Workbook by Adora Fridge and Eliot Ford or Generalized Anxiety Disorder by Eather Colas, and Eliot Ford); review, reinforce success, and provide corrective feedback toward improvement. Teach the client problem-solving strategies involving specifically defining a problem, generating options for addressing it, evaluating the pros and cons of each option, selecting and implementing an optional action, and reevaluating and refining the action. Objective Learn and implement calming skills to reduce overall anxiety and manage anxiety symptoms. Target Date: 2023-04-15 Frequency: Biweekly  Progress: 60 Modality: individual  Related Interventions Assign the client to read about progressive muscle relaxation and other calming strategies in relevant books or treatment manuals (e.g., Progressive Relaxation Training by Leroy Kennedy; Mastery of Your Anxiety and Worry: Workbook by Beckie Busing). Assign the client homework each session in which he/she practices relaxation exercises daily, gradually applying them progressively from non-anxiety-provoking to anxiety-provoking situations; review and reinforce success while providing corrective feedback toward  improvement. Teach the client calming/relaxation skills (e.g., applied relaxation, progressive muscle relaxation, cue controlled relaxation; mindful breathing; biofeedback) and how to discriminate better between relaxation and tension; teach the client how to apply these skills to his/her daily life. 7. Recognize, accept, and cope with feelings of depression. 8. Reduce overall frequency, intensity, and duration of the anxiety so that daily functioning is not impaired. 9. Resolve the core conflict that is the source of anxiety. 10. Stabilize anxiety level while increasing ability to function on a daily basis. Diagnosis F43.23  Conditions For Discharge Achievement of treatment goals and objectives   Clint Bolder, LCSW

## 2022-12-22 ENCOUNTER — Ambulatory Visit (INDEPENDENT_AMBULATORY_CARE_PROVIDER_SITE_OTHER): Payer: Self-pay | Admitting: Psychology

## 2022-12-22 DIAGNOSIS — F4323 Adjustment disorder with mixed anxiety and depressed mood: Secondary | ICD-10-CM

## 2022-12-22 NOTE — Progress Notes (Signed)
[place] Counselor/Therapist Progress Note  Patient ID: Jonathon Middleton, MRN: [id number],    Date: 12/22/2022  Time Spent: 12:00pm - 12:50pm    50 minutes   Treatment Type: Individual Therapy  Reported Symptoms: stress  Mental Status Exam: Appearance:  Casual     Behavior: Appropriate  Motor: Normal  Speech/Language:  Normal Rate  Affect: Appropriate  Mood: normal  Thought process: normal  Thought content:   WNL  Sensory/Perceptual disturbances:   WNL  Orientation: oriented to person, place, time/date, and situation  Attention: Good  Concentration: Good  Memory: WNL  Fund of knowledge:  Good  Insight:   Good  Judgment:  Good  Impulse Control: Good   Risk Assessment: Danger to Self:  No Self-injurious Behavior: No Danger to Others: No Duty to Warn:no Physical Aggression / Violence:No  Access to Firearms a concern: No  Gang Involvement:No   Subjective: Pt present for face-to-face individual therapy via video Webex.  Pt consents to telehealth video session due to COVID 19 pandemic. Location of pt: home Location of therapist: home office. Pt talked about his friend Jonathon Middleton visiting him this week.  He is happy to have the company. Pt talked about his fear.   He has thought about where it comes from and realized it came from his mother.  After pt's father died there was a big increase in his mother's anxiety.  Pt's mother's anxiety impacted him bc his mother discouraged him from taking risks.   Addressed the family of origin issues.   Helped pt process his feelings.  Identified how these issues have impacted his self esteem.   Pt states he is fearful bc he is retirement age but did not prepare financially for retirement.   Pt talked about work.  Addressed the work dynamics and how they impact pt.   Worked on increasing self care.  Provided supportive therapy.    Interventions: Cognitive Behavioral Therapy and Insight-Oriented  Diagnosis:  F43.23  Plan:  Plan of Care: Recommend ongoing therapy.  Pt participated in setting treatment goals.  Plan to continue to meet every two weeks.   Treatment Plan (target date: 04/15/2023) Client Abilities/Strengths  Pt is bright, engaging, and motivated for therapy.  Client Treatment Preferences  Individual therapy.  Client Statement of Needs  Improve copings skills and understand herself better. Improve self esteem.  Symptoms  Depressed or irritable mood. Excessive and/or unrealistic worry that is difficult to control occurring more days than not for at least 6 months about a number of events or activities. Hypervigilance (e.g., feeling constantly on edge, experiencing concentration difficulties, having trouble falling or staying asleep, exhibiting a general state of irritability). Low self-esteem. Problems Addressed  Unipolar Depression, Anxiety Goals 1. Alleviate depressive symptoms and return to previous level of effective functioning. 2. Appropriately grieve the loss in order to normalize mood and to return to previously adaptive level of functioning. Objective Learn and implement behavioral strategies to overcome depression. Target Date: 2023-04-15 Frequency: Biweekly  Progress: 60 Modality: individual  Related Interventions Assist the client in developing skills that increase the likelihood of deriving pleasure from behavioral activation (e.g., assertiveness skills, developing an exercise plan, less internal/more external focus, increased social involvement); reinforce success. Engage the client in "behavioral activation," increasing his/her activity level and contact with sources of reward, while identifying processes that inhibit activation. use behavioral techniques such as instruction, rehearsal, role-playing, role reversal, as needed, to facilitate activity in the client's daily life; reinforce success. 3. Develop healthy interpersonal relationships  that lead to the alleviation and help prevent the  relapse of depression. 4. Develop healthy thinking patterns and beliefs about self, others, and the world that lead to the alleviation and help prevent the relapse of depression. 5. Enhance ability to effectively cope with the full variety of life's worries and anxieties. 6. Learn and implement coping skills that result in a reduction of anxiety and worry, and improved daily functioning. Objective Learn and implement problem-solving strategies for realistically addressing worries. Target Date: 2023-04-15 Frequency: Biweekly  Progress: 60 Modality: individual  Related Interventions Assign the client a homework exercise in which he/she problem-solves a current problem (see Mastery of Your Anxiety and Worry: Workbook by Adora Fridge and Eliot Ford or Generalized Anxiety Disorder by Eather Colas, and Eliot Ford); review, reinforce success, and provide corrective feedback toward improvement. Teach the client problem-solving strategies involving specifically defining a problem, generating options for addressing it, evaluating the pros and cons of each option, selecting and implementing an optional action, and reevaluating and refining the action. Objective Learn and implement calming skills to reduce overall anxiety and manage anxiety symptoms. Target Date: 2023-04-15 Frequency: Biweekly  Progress: 60 Modality: individual  Related Interventions Assign the client to read about progressive muscle relaxation and other calming strategies in relevant books or treatment manuals (e.g., Progressive Relaxation Training by Leroy Kennedy; Mastery of Your Anxiety and Worry: Workbook by Beckie Busing). Assign the client homework each session in which he/she practices relaxation exercises daily, gradually applying them progressively from non-anxiety-provoking to anxiety-provoking situations; review and reinforce success while providing corrective feedback toward improvement. Teach the client calming/relaxation skills  (e.g., applied relaxation, progressive muscle relaxation, cue controlled relaxation; mindful breathing; biofeedback) and how to discriminate better between relaxation and tension; teach the client how to apply these skills to his/her daily life. 7. Recognize, accept, and cope with feelings of depression. 8. Reduce overall frequency, intensity, and duration of the anxiety so that daily functioning is not impaired. 9. Resolve the core conflict that is the source of anxiety. 10. Stabilize anxiety level while increasing ability to function on a daily basis. Diagnosis F43.23  Conditions For Discharge Achievement of treatment goals and objectives   Clint Bolder, LCSW

## 2022-12-28 ENCOUNTER — Encounter: Payer: Self-pay | Admitting: Family Medicine

## 2022-12-28 ENCOUNTER — Ambulatory Visit (INDEPENDENT_AMBULATORY_CARE_PROVIDER_SITE_OTHER): Payer: Medicare Other | Admitting: Family Medicine

## 2022-12-28 VITALS — BP 140/80 | HR 65 | Temp 97.6°F | Ht 69.0 in | Wt 227.4 lb

## 2022-12-28 DIAGNOSIS — I10 Essential (primary) hypertension: Secondary | ICD-10-CM

## 2022-12-28 DIAGNOSIS — E1169 Type 2 diabetes mellitus with other specified complication: Secondary | ICD-10-CM

## 2022-12-28 LAB — POCT GLYCOSYLATED HEMOGLOBIN (HGB A1C): Hemoglobin A1C: 6 % — AB (ref 4.0–5.6)

## 2022-12-28 NOTE — Assessment & Plan Note (Signed)
Chronic, diet controlled. Improved readings today - congratulated.  He is motivated to continue healthy diet and lifestyle choices.

## 2022-12-28 NOTE — Progress Notes (Signed)
Patient ID: Jonathon Middleton, male    DOB: November 15, 1951, 72 y.o.   MRN: [id number]  This visit was conducted in person.  BP (!) 140/80   Pulse 65   Temp 97.6 F (36.4 C) (Temporal)   Ht 5\' 9"  (1.753 m)   Wt 227 lb 6 oz (103.1 kg)   SpO2 97%   BMI 33.58 kg/m   BP Readings from Last 3 Encounters:  12/28/22 (!) 140/80  08/15/22 130/80  06/27/22 140/72    CC: 6 mo DM f/u visit  Subjective:   HPI: Jonathon Middleton is a 72 y.o. male presenting on 12/28/2022 for Medical Management of Chronic Issues (Here for 6 mo DM f/u.)   DM - does not regularly check sugars over the weekend it was 120-130 fasting. Compliant with antihyperglycemic regimen which includes: diet controlled. Increase in resistance training. Has changed diet - low carb bread, low fat cottage cheese instead of yogurt. Denies low sugars or hypoglycemic symptoms. Denies paresthesias, blurry vision. Last diabetic eye exam DUE - upcoming appt 02/2022. Glucometer brand: accuchek. Last foot exam: 02/2022. DSME: 2010. Lab Results  Component Value Date   HGBA1C 6.0 (A) 12/28/2022   Diabetic Foot Exam - Simple   Simple Foot Form Diabetic Foot exam was performed with the following findings: Yes 12/28/2022  8:23 AM  Visual Inspection No deformities, no ulcerations, no other skin breakdown bilaterally: Yes Sensation Testing Intact to touch and monofilament testing bilaterally: Yes Pulse Check Posterior Tibialis and Dorsalis pulse intact bilaterally: Yes Comments    Lab Results  Component Value Date   MICROALBUR <0.7 06/16/2022         Relevant past medical, surgical, family and social history reviewed and updated as indicated. Interim medical history since our last visit reviewed. Allergies and medications reviewed and updated. Outpatient Medications Prior to Visit  Medication Sig Dispense Refill   apixaban (ELIQUIS) 5 MG TABS tablet Take 1 tablet (5 mg total) by mouth 2 (two) times daily. 180 tablet 3   atorvastatin  (LIPITOR) 40 MG tablet Take 1 tablet (40 mg total) by mouth daily. 90 tablet 3   Cholecalciferol (VITAMIN D) 2000 units CAPS Take 2,000 Units by mouth 2 (two) times daily.      Coenzyme Q10 (CO Q-10) 100 MG CAPS Take 100 mg by mouth daily with breakfast.      diltiazem (CARDIZEM CD) 120 MG 24 hr capsule TAKE 1 CAPSULE BY MOUTH EVERY DAY 90 capsule 3   glucose blood (ONE TOUCH ULTRA TEST) test strip Use to check sugar once daily and as needed. Dx: E11.9 100 each 1   Lancets (ONETOUCH ULTRASOFT) lancets USE AS INSTRUCTED 100 each 1   losartan-hydrochlorothiazide (HYZAAR) 50-12.5 MG tablet Take 1 tablet by mouth daily. 90 tablet 3   metoprolol succinate (TOPROL-XL) 50 MG 24 hr tablet Take 1 tablet (50 mg total) by mouth daily. Take with or immediately following a meal. 90 tablet 3   omega-3 fish oil (MAXEPA) 1000 MG CAPS capsule Take 1 capsule by mouth 2 (two) times daily.      sertraline (ZOLOFT) 100 MG tablet Take 1 tablet (100 mg total) by mouth daily. 90 tablet 3   TURMERIC PO Take by mouth in the morning and at bedtime.     No facility-administered medications prior to visit.     Per HPI unless specifically indicated in ROS section below Review of Systems  Objective:  BP (!) 140/80   Pulse 65   Temp 97.6 F (  36.4 C) (Temporal)   Ht 5\' 9"  (1.753 m)   Wt 227 lb 6 oz (103.1 kg)   SpO2 97%   BMI 33.58 kg/m   Wt Readings from Last 3 Encounters:  12/28/22 227 lb 6 oz (103.1 kg)  08/15/22 229 lb 8 oz (104.1 kg)  06/27/22 227 lb 6 oz (103.1 kg)      Physical Exam Vitals and nursing note reviewed.  Constitutional:      Appearance: Normal appearance. He is not ill-appearing.  Eyes:     Extraocular Movements: Extraocular movements intact.     Conjunctiva/sclera: Conjunctivae normal.     Pupils: Pupils are equal, round, and reactive to light.  Cardiovascular:     Rate and Rhythm: Normal rate and regular rhythm.     Pulses: Normal pulses.     Heart sounds: Normal heart sounds. No  murmur heard. Pulmonary:     Effort: Pulmonary effort is normal. No respiratory distress.     Breath sounds: Normal breath sounds. No wheezing, rhonchi or rales.  Musculoskeletal:     Right lower leg: No edema.     Left lower leg: No edema.     Comments: See HPI for foot exam if done  Skin:    General: Skin is warm and dry.     Findings: No rash.     Comments: Reticular veins to medial ankles bilaterally  Neurological:     Mental Status: He is alert.  Psychiatric:        Mood and Affect: Mood normal.        Behavior: Behavior normal.       Results for orders placed or performed in visit on 12/28/22  POCT glycosylated hemoglobin (Hb A1C)  Result Value Ref Range   Hemoglobin A1C 6.0 (A) 4.0 - 5.6 %   HbA1c POC (<> result, manual entry)     HbA1c, POC (prediabetic range)     HbA1c, POC (controlled diabetic range)      Assessment & Plan:   Problem List Items Addressed This Visit     Essential hypertension    BP borderline - continue current regimen of hyzaar, diltiazem, toprol XL daily      Type 2 diabetes mellitus with other specified complication (HCC) - Primary    Chronic, diet controlled. Improved readings today - congratulated.  He is motivated to continue healthy diet and lifestyle choices.       Relevant Orders   POCT glycosylated hemoglobin (Hb A1C) (Completed)     No orders of the defined types were placed in this encounter.   Orders Placed This Encounter  Procedures   POCT glycosylated hemoglobin (Hb A1C)    Patient Instructions  Have eye doctor send [place] diabetic eye exam report. Sugars are doing great! Continue healthy diet changes, regular resistance training.  Return in 6 months for wellness visit.   Follow up plan: Return in about 6 months (around 06/28/2023), or if symptoms worsen or fail to improve, for medicare wellness visit.  Ria Bush, MD

## 2022-12-28 NOTE — Assessment & Plan Note (Signed)
BP borderline - continue current regimen of hyzaar, diltiazem, toprol XL daily

## 2022-12-28 NOTE — Patient Instructions (Addendum)
Have eye doctor send [place] diabetic eye exam report. Sugars are doing great! Continue healthy diet changes, regular resistance training.  Return in 6 months for wellness visit.

## 2023-01-05 ENCOUNTER — Ambulatory Visit (INDEPENDENT_AMBULATORY_CARE_PROVIDER_SITE_OTHER): Payer: Self-pay | Admitting: Psychology

## 2023-01-05 DIAGNOSIS — F4323 Adjustment disorder with mixed anxiety and depressed mood: Secondary | ICD-10-CM

## 2023-01-05 NOTE — Progress Notes (Signed)
[facility] Counselor/Therapist Progress Note  Patient ID: Jonathon Middleton, MRN: [id number],    Date: 01/05/2023  Time Spent: 12:00pm - 12:55pm    55 minutes   Treatment Type: Individual Therapy  Reported Symptoms: stress  Mental Status Exam: Appearance:  Casual     Behavior: Appropriate  Motor: Normal  Speech/Language:  Normal Rate  Affect: Appropriate  Mood: normal  Thought process: normal  Thought content:   WNL  Sensory/Perceptual disturbances:   WNL  Orientation: oriented to person, place, time/date, and situation  Attention: Good  Concentration: Good  Memory: WNL  Fund of knowledge:  Good  Insight:   Good  Judgment:  Good  Impulse Control: Good   Risk Assessment: Danger to Self:  No Self-injurious Behavior: No Danger to Others: No Duty to Warn:no Physical Aggression / Violence:No  Access to Firearms a concern: No  Gang Involvement:No   Subjective: Pt present for face-to-face individual therapy via video Webex.  Pt consents to telehealth video session due to COVID 19 pandemic. Location of pt: home Location of therapist: home office. Pt talked about his time with his friend Jonathon Middleton a couple of weeks ago.  They had some good conversations and pt shared about therapy and encouraged Jonathon Middleton to engage  with therapy.    Pt talked about his son Jonathon Middleton who is pursuing medication for depression and anxiety.   Pt was glad he was able to share his experience with mental health treatment and be an encourager to [name].    Pt talked about finances.  He states money has been tight lately.  Addressed his concerns.   Pt talked about work.  Addressed the work dynamics and how they impact pt.   Worked on increasing self care.  Provided supportive therapy.    Interventions: Cognitive Behavioral Therapy and Insight-Oriented  Diagnosis:  F43.23  Plan: Plan of Care: Recommend ongoing therapy.  Pt participated in setting treatment goals.  Plan to continue to meet every  two weeks.   Treatment Plan (target date: 04/15/2023) Client Abilities/Strengths  Pt is bright, engaging, and motivated for therapy.  Client Treatment Preferences  Individual therapy.  Client Statement of Needs  Improve copings skills and understand herself better. Improve self esteem.  Symptoms  Depressed or irritable mood. Excessive and/or unrealistic worry that is difficult to control occurring more days than not for at least 6 months about a number of events or activities. Hypervigilance (e.g., feeling constantly on edge, experiencing concentration difficulties, having trouble falling or staying asleep, exhibiting a general state of irritability). Low self-esteem. Problems Addressed  Unipolar Depression, Anxiety Goals 1. Alleviate depressive symptoms and return to previous level of effective functioning. 2. Appropriately grieve the loss in order to normalize mood and to return to previously adaptive level of functioning. Objective Learn and implement behavioral strategies to overcome depression. Target Date: 2023-04-15 Frequency: Biweekly  Progress: 60 Modality: individual  Related Interventions Assist the client in developing skills that increase the likelihood of deriving pleasure from behavioral activation (e.g., assertiveness skills, developing an exercise plan, less internal/more external focus, increased social involvement); reinforce success. Engage the client in "behavioral activation," increasing his/her activity level and contact with sources of reward, while identifying processes that inhibit activation. use behavioral techniques such as instruction, rehearsal, role-playing, role reversal, as needed, to facilitate activity in the client's daily life; reinforce success. 3. Develop healthy interpersonal relationships that lead to the alleviation and help prevent the relapse of depression. 4. Develop healthy thinking patterns and beliefs about self,  others, and the world that lead  to the alleviation and help prevent the relapse of depression. 5. Enhance ability to effectively cope with the full variety of life's worries and anxieties. 6. Learn and implement coping skills that result in a reduction of anxiety and worry, and improved daily functioning. Objective Learn and implement problem-solving strategies for realistically addressing worries. Target Date: 2023-04-15 Frequency: Biweekly  Progress: 60 Modality: individual  Related Interventions Assign the client a homework exercise in which he/she problem-solves a current problem (see Mastery of Your Anxiety and Worry: Workbook by Adora Fridge and Eliot Ford or Generalized Anxiety Disorder by Eather Colas, and Eliot Ford); review, reinforce success, and provide corrective feedback toward improvement. Teach the client problem-solving strategies involving specifically defining a problem, generating options for addressing it, evaluating the pros and cons of each option, selecting and implementing an optional action, and reevaluating and refining the action. Objective Learn and implement calming skills to reduce overall anxiety and manage anxiety symptoms. Target Date: 2023-04-15 Frequency: Biweekly  Progress: 60 Modality: individual  Related Interventions Assign the client to read about progressive muscle relaxation and other calming strategies in relevant books or treatment manuals (e.g., Progressive Relaxation Training by Leroy Kennedy; Mastery of Your Anxiety and Worry: Workbook by Beckie Busing). Assign the client homework each session in which he/she practices relaxation exercises daily, gradually applying them progressively from non-anxiety-provoking to anxiety-provoking situations; review and reinforce success while providing corrective feedback toward improvement. Teach the client calming/relaxation skills (e.g., applied relaxation, progressive muscle relaxation, cue controlled relaxation; mindful breathing; biofeedback)  and how to discriminate better between relaxation and tension; teach the client how to apply these skills to his/her daily life. 7. Recognize, accept, and cope with feelings of depression. 8. Reduce overall frequency, intensity, and duration of the anxiety so that daily functioning is not impaired. 9. Resolve the core conflict that is the source of anxiety. 10. Stabilize anxiety level while increasing ability to function on a daily basis. Diagnosis F43.23  Conditions For Discharge Achievement of treatment goals and objectives   Clint Bolder, LCSW

## 2023-01-15 ENCOUNTER — Encounter: Payer: Self-pay | Admitting: Family Medicine

## 2023-01-15 DIAGNOSIS — E1169 Type 2 diabetes mellitus with other specified complication: Secondary | ICD-10-CM

## 2023-01-16 MED ORDER — ACCU-CHEK GUIDE ME W/DEVICE KIT: PACK | 0 refills | Status: AC

## 2023-01-16 MED ORDER — ACCU-CHEK GUIDE VI STRP: ORAL_STRIP | 3 refills | Status: DC

## 2023-01-16 MED ORDER — ACCU-CHEK SOFTCLIX LANCETS MISC: 3 refills | Status: DC

## 2023-01-16 NOTE — Telephone Encounter (Signed)
E-scribed Accu-Chek Guide Me meter kit, Accu-Chek Guide test strips and Accu-Chek lancets to [facility] order pharmacy.

## 2023-01-16 NOTE — Telephone Encounter (Signed)
Have printed and placed in your box for review.

## 2023-01-19 ENCOUNTER — Ambulatory Visit (INDEPENDENT_AMBULATORY_CARE_PROVIDER_SITE_OTHER): Payer: Self-pay | Admitting: Psychology

## 2023-01-19 DIAGNOSIS — F4323 Adjustment disorder with mixed anxiety and depressed mood: Secondary | ICD-10-CM

## 2023-01-19 NOTE — Progress Notes (Signed)
[facility] Counselor/Therapist Progress Note  Patient ID: Jonathon Middleton, MRN: [id number],    Date: 01/19/2023  Time Spent: 12:00pm - 12:55pm    55 minutes   Treatment Type: Individual Therapy  Reported Symptoms: stress  Mental Status Exam: Appearance:  Casual     Behavior: Appropriate  Motor: Normal  Speech/Language:  Normal Rate  Affect: Appropriate  Mood: normal  Thought process: normal  Thought content:   WNL  Sensory/Perceptual disturbances:   WNL  Orientation: oriented to person, place, time/date, and situation  Attention: Good  Concentration: Good  Memory: WNL  Fund of knowledge:  Good  Insight:   Good  Judgment:  Good  Impulse Control: Good   Risk Assessment: Danger to Self:  No Self-injurious Behavior: No Danger to Others: No Duty to Warn:no Physical Aggression / Violence:No  Access to Firearms a concern: No  Gang Involvement:No   Subjective: Pt present for face-to-face individual therapy via video Webex.  Pt consents to telehealth video session due to COVID 19 pandemic. Location of pt: home Location of therapist: home office. Pt talked about work.  Addressed the work dynamics and how they impact pt.  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  Pt talked about his fears.  Addressed how his fears have kept him for reaching his full potential.  Pt states he has a fear of losing what he has become and a fear of new things.   Pt states he needs to embrace vulnerability.  Helped pt process these issues and feelings.  Pt is realizing the power fear has had over him.  Pt talked about his friend [name].  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  Pt is concerned about [name] being transitioned to a new therapist.  Addressed how pt can support his friend.   Worked on increasing self care.  Provided supportive therapy.    Interventions: Cognitive Behavioral Therapy and  Insight-Oriented  Diagnosis:  F43.23  Plan: Plan of Care: Recommend ongoing therapy.  Pt participated in setting treatment goals.  Plan to continue to meet every two weeks.   Treatment Plan (target date: 04/15/2023) Client Abilities/Strengths  Pt is bright, engaging, and motivated for therapy.  Client Treatment Preferences  Individual therapy.  Client Statement of Needs  Improve copings skills and understand herself better. Improve self esteem.  Symptoms  Depressed or irritable mood. Excessive and/or unrealistic worry that is difficult to control occurring more days than not for at least 6 months about a number of events or activities. Hypervigilance (e.g., feeling constantly on edge, experiencing concentration difficulties, having trouble falling or staying asleep, exhibiting a general state of irritability). Low self-esteem. Problems Addressed  Unipolar Depression, Anxiety Goals 1. Alleviate depressive symptoms and return to previous level of effective functioning. 2. Appropriately grieve the loss in order to normalize mood and to return to previously adaptive level of functioning. Objective Learn and implement behavioral strategies to overcome depression. Target Date: 2023-04-15 Frequency: Biweekly  Progress: 60 Modality: individual  Related Interventions Assist the client in developing skills that increase the likelihood of deriving pleasure from behavioral activation (e.g., assertiveness skills, developing an exercise plan, less internal/more external focus, increased social involvement); reinforce success. Engage the client in "behavioral activation," increasing his/her activity level and contact with sources of reward, while identifying processes that inhibit activation. use behavioral techniques such as instruction, rehearsal, role-playing, role reversal, as needed, to facilitate activity  in the client's daily life; reinforce success. 3. Develop healthy interpersonal relationships  that lead to the alleviation and help prevent the relapse of depression. 4. Develop healthy thinking patterns and beliefs about self, others, and the world that lead to the alleviation and help prevent the relapse of depression. 5. Enhance ability to effectively cope with the full variety of life's worries and anxieties. 6. Learn and implement coping skills that result in a reduction of anxiety and worry, and improved daily functioning. Objective Learn and implement problem-solving strategies for realistically addressing worries. Target Date: 2023-04-15 Frequency: Biweekly  Progress: 60 Modality: individual  Related Interventions Assign the client a homework exercise in which he/she problem-solves a current problem (see Mastery of Your Anxiety and Worry: Workbook by Adora Fridge and Eliot Ford or Generalized Anxiety Disorder by Eather Colas, and Eliot Ford); review, reinforce success, and provide corrective feedback toward improvement. Teach the client problem-solving strategies involving specifically defining a problem, generating options for addressing it, evaluating the pros and cons of each option, selecting and implementing an optional action, and reevaluating and refining the action. Objective Learn and implement calming skills to reduce overall anxiety and manage anxiety symptoms. Target Date: 2023-04-15 Frequency: Biweekly  Progress: 60 Modality: individual  Related Interventions Assign the client to read about progressive muscle relaxation and other calming strategies in relevant books or treatment manuals (e.g., Progressive Relaxation Training by Leroy Kennedy; Mastery of Your Anxiety and Worry: Workbook by Beckie Busing). Assign the client homework each session in which he/she practices relaxation exercises daily, gradually applying them progressively from non-anxiety-provoking to anxiety-provoking situations; review and reinforce success while providing corrective feedback toward  improvement. Teach the client calming/relaxation skills (e.g., applied relaxation, progressive muscle relaxation, cue controlled relaxation; mindful breathing; biofeedback) and how to discriminate better between relaxation and tension; teach the client how to apply these skills to his/her daily life. 7. Recognize, accept, and cope with feelings of depression. 8. Reduce overall frequency, intensity, and duration of the anxiety so that daily functioning is not impaired. 9. Resolve the core conflict that is the source of anxiety. 10. Stabilize anxiety level while increasing ability to function on a daily basis. Diagnosis F43.23  Conditions For Discharge Achievement of treatment goals and objectives   Clint Bolder, LCSW

## 2023-02-02 ENCOUNTER — Ambulatory Visit (INDEPENDENT_AMBULATORY_CARE_PROVIDER_SITE_OTHER): Payer: Self-pay | Admitting: Psychology

## 2023-02-02 DIAGNOSIS — F4323 Adjustment disorder with mixed anxiety and depressed mood: Secondary | ICD-10-CM

## 2023-02-02 NOTE — Progress Notes (Signed)
[facility] Counselor/Therapist Progress Note  Patient ID: Lavone Lauture, MRN: [id number],    Date: 02/02/2023  Time Spent: 12:00pm - 12:55pm    55 minutes   Treatment Type: Individual Therapy  Reported Symptoms: stress  Mental Status Exam: Appearance:  Casual     Behavior: Appropriate  Motor: Normal  Speech/Language:  Normal Rate  Affect: Appropriate  Mood: normal  Thought process: normal  Thought content:   WNL  Sensory/Perceptual disturbances:   WNL  Orientation: oriented to person, place, time/date, and situation  Attention: Good  Concentration: Good  Memory: WNL  Fund of knowledge:  Good  Insight:   Good  Judgment:  Good  Impulse Control: Good   Risk Assessment: Danger to Self:  No Self-injurious Behavior: No Danger to Others: No Duty to Warn:no Physical Aggression / Violence:No  Access to Firearms a concern: No  Gang Involvement:No   Subjective: Pt present for face-to-face individual therapy via video Webex.  Pt consents to telehealth video session due to COVID 19 pandemic. Location of pt: home Location of therapist: home office. Pt talked about work.  Pt states the last two weeks have been very good at work.  He is trying to celebrate the "small victories".   Pt has been working on improving his health.   He is decreasing calories to support his goal to lose weight.  Pt has lost 14 lbs in the past month.  He is sleeping better bc he is putting away electronics before bedtime.  Pt states he feels physically better.   Pt is working on present moment mindfulness.  Pt wants to embrace the positive experience of these past 2 weeks.  Encouraged pt to journal about it.  Worked on increasing self care.  Provided supportive therapy.    Interventions: Cognitive Behavioral Therapy and Insight-Oriented  Diagnosis:  F43.23  Plan: Plan of Care: Recommend ongoing therapy.  Pt participated in setting treatment goals.  Plan to continue to meet every two  weeks.   Treatment Plan (target date: 04/15/2023) Client Abilities/Strengths  Pt is bright, engaging, and motivated for therapy.  Client Treatment Preferences  Individual therapy.  Client Statement of Needs  Improve copings skills and understand herself better. Improve self esteem.  Symptoms  Depressed or irritable mood. Excessive and/or unrealistic worry that is difficult to control occurring more days than not for at least 6 months about a number of events or activities. Hypervigilance (e.g., feeling constantly on edge, experiencing concentration difficulties, having trouble falling or staying asleep, exhibiting a general state of irritability). Low self-esteem. Problems Addressed  Unipolar Depression, Anxiety Goals 1. Alleviate depressive symptoms and return to previous level of effective functioning. 2. Appropriately grieve the loss in order to normalize mood and to return to previously adaptive level of functioning. Objective Learn and implement behavioral strategies to overcome depression. Target Date: 2023-04-15 Frequency: Biweekly  Progress: 60 Modality: individual  Related Interventions Assist the client in developing skills that increase the likelihood of deriving pleasure from behavioral activation (e.g., assertiveness skills, developing an exercise plan, less internal/more external focus, increased social involvement); reinforce success. Engage the client in "behavioral activation," increasing his/her activity level and contact with sources of reward, while identifying processes that inhibit activation. use behavioral techniques such as instruction, rehearsal, role-playing, role reversal, as needed, to facilitate activity in the client's daily life; reinforce success. 3. Develop healthy interpersonal relationships that lead to the alleviation and help prevent the relapse of depression. 4. Develop healthy thinking patterns and beliefs about self,  others, and the world that lead to  the alleviation and help prevent the relapse of depression. 5. Enhance ability to effectively cope with the full variety of life's worries and anxieties. 6. Learn and implement coping skills that result in a reduction of anxiety and worry, and improved daily functioning. Objective Learn and implement problem-solving strategies for realistically addressing worries. Target Date: 2023-04-15 Frequency: Biweekly  Progress: 60 Modality: individual  Related Interventions Assign the client a homework exercise in which he/she problem-solves a current problem (see Mastery of Your Anxiety and Worry: Workbook by Adora Fridge and Eliot Ford or Generalized Anxiety Disorder by Eather Colas, and Eliot Ford); review, reinforce success, and provide corrective feedback toward improvement. Teach the client problem-solving strategies involving specifically defining a problem, generating options for addressing it, evaluating the pros and cons of each option, selecting and implementing an optional action, and reevaluating and refining the action. Objective Learn and implement calming skills to reduce overall anxiety and manage anxiety symptoms. Target Date: 2023-04-15 Frequency: Biweekly  Progress: 60 Modality: individual  Related Interventions Assign the client to read about progressive muscle relaxation and other calming strategies in relevant books or treatment manuals (e.g., Progressive Relaxation Training by Leroy Kennedy; Mastery of Your Anxiety and Worry: Workbook by Beckie Busing). Assign the client homework each session in which he/she practices relaxation exercises daily, gradually applying them progressively from non-anxiety-provoking to anxiety-provoking situations; review and reinforce success while providing corrective feedback toward improvement. Teach the client calming/relaxation skills (e.g., applied relaxation, progressive muscle relaxation, cue controlled relaxation; mindful breathing; biofeedback)  and how to discriminate better between relaxation and tension; teach the client how to apply these skills to his/her daily life. 7. Recognize, accept, and cope with feelings of depression. 8. Reduce overall frequency, intensity, and duration of the anxiety so that daily functioning is not impaired. 9. Resolve the core conflict that is the source of anxiety. 10. Stabilize anxiety level while increasing ability to function on a daily basis. Diagnosis F43.23  Conditions For Discharge Achievement of treatment goals and objectives   Clint Bolder, LCSW

## 2023-02-16 ENCOUNTER — Ambulatory Visit (INDEPENDENT_AMBULATORY_CARE_PROVIDER_SITE_OTHER): Payer: Self-pay | Admitting: Psychology

## 2023-02-16 DIAGNOSIS — F4323 Adjustment disorder with mixed anxiety and depressed mood: Secondary | ICD-10-CM

## 2023-02-16 NOTE — Progress Notes (Signed)
[facility] Counselor/Therapist Progress Note  Patient ID: Jonathon Middleton, MRN: [id number],    Date: 02/16/2023  Time Spent: 12:00pm - 12:55pm    55 minutes   Treatment Type: Individual Therapy  Reported Symptoms: stress  Mental Status Exam: Appearance:  Casual     Behavior: Appropriate  Motor: Normal  Speech/Language:  Normal Rate  Affect: Appropriate  Mood: normal  Thought process: normal  Thought content:   WNL  Sensory/Perceptual disturbances:   WNL  Orientation: oriented to person, place, time/date, and situation  Attention: Good  Concentration: Good  Memory: WNL  Fund of knowledge:  Good  Insight:   Good  Judgment:  Good  Impulse Control: Good   Risk Assessment: Danger to Self:  No Self-injurious Behavior: No Danger to Others: No Duty to Warn:no Physical Aggression / Violence:No  Access to Firearms a concern: No  Gang Involvement:No   Subjective: Pt present for face-to-face individual therapy via video Webex.  Pt consents to telehealth video session due to COVID 19 pandemic. Location of pt: home Location of therapist: home office. Pt talked about not being in as great a place as he was a couple of weeks ago bc "the other shoe dropped".   Pt was pleased with the way he handled it.  Addressed what happened.  Pt's work searches fell through.  Pt feels very disappointed and frustrated.  Helped pt process his feelings and work dynamics.   Pt states he has put into practice the coping skills we have worked on in therapy.   Pt talked about his son and girlfriend who are moving closer to him.  They will rent for a year and then move in with pt.  Pt is pleased with the time frame of their plans.   Worked on increasing self care.  Provided supportive therapy.    Interventions: Cognitive Behavioral Therapy and Insight-Oriented  Diagnosis:  F43.23  Plan: Plan of Care: Recommend ongoing therapy.  Pt participated in setting treatment goals.  Plan to  continue to meet every two weeks.   Treatment Plan (target date: 04/15/2023) Client Abilities/Strengths  Pt is bright, engaging, and motivated for therapy.  Client Treatment Preferences  Individual therapy.  Client Statement of Needs  Improve copings skills and understand herself better. Improve self esteem.  Symptoms  Depressed or irritable mood. Excessive and/or unrealistic worry that is difficult to control occurring more days than not for at least 6 months about a number of events or activities. Hypervigilance (e.g., feeling constantly on edge, experiencing concentration difficulties, having trouble falling or staying asleep, exhibiting a general state of irritability). Low self-esteem. Problems Addressed  Unipolar Depression, Anxiety Goals 1. Alleviate depressive symptoms and return to previous level of effective functioning. 2. Appropriately grieve the loss in order to normalize mood and to return to previously adaptive level of functioning. Objective Learn and implement behavioral strategies to overcome depression. Target Date: 2023-04-15 Frequency: Biweekly  Progress: 60 Modality: individual  Related Interventions Assist the client in developing skills that increase the likelihood of deriving pleasure from behavioral activation (e.g., assertiveness skills, developing an exercise plan, less internal/more external focus, increased social involvement); reinforce success. Engage the client in "behavioral activation," increasing his/her activity level and contact with sources of reward, while identifying processes that inhibit activation. use behavioral techniques such as instruction, rehearsal, role-playing, role reversal, as needed, to facilitate activity in the client's daily life; reinforce success. 3. Develop healthy interpersonal relationships that lead to the alleviation and help prevent the relapse of  depression. 4. Develop healthy thinking patterns and beliefs about self, others,  and the world that lead to the alleviation and help prevent the relapse of depression. 5. Enhance ability to effectively cope with the full variety of life's worries and anxieties. 6. Learn and implement coping skills that result in a reduction of anxiety and worry, and improved daily functioning. Objective Learn and implement problem-solving strategies for realistically addressing worries. Target Date: 2023-04-15 Frequency: Biweekly  Progress: 60 Modality: individual  Related Interventions Assign the client a homework exercise in which he/she problem-solves a current problem (see Mastery of Your Anxiety and Worry: Workbook by Adora Fridge and Eliot Ford or Generalized Anxiety Disorder by Eather Colas, and Eliot Ford); review, reinforce success, and provide corrective feedback toward improvement. Teach the client problem-solving strategies involving specifically defining a problem, generating options for addressing it, evaluating the pros and cons of each option, selecting and implementing an optional action, and reevaluating and refining the action. Objective Learn and implement calming skills to reduce overall anxiety and manage anxiety symptoms. Target Date: 2023-04-15 Frequency: Biweekly  Progress: 60 Modality: individual  Related Interventions Assign the client to read about progressive muscle relaxation and other calming strategies in relevant books or treatment manuals (e.g., Progressive Relaxation Training by Leroy Kennedy; Mastery of Your Anxiety and Worry: Workbook by Beckie Busing). Assign the client homework each session in which he/she practices relaxation exercises daily, gradually applying them progressively from non-anxiety-provoking to anxiety-provoking situations; review and reinforce success while providing corrective feedback toward improvement. Teach the client calming/relaxation skills (e.g., applied relaxation, progressive muscle relaxation, cue controlled relaxation; mindful  breathing; biofeedback) and how to discriminate better between relaxation and tension; teach the client how to apply these skills to his/her daily life. 7. Recognize, accept, and cope with feelings of depression. 8. Reduce overall frequency, intensity, and duration of the anxiety so that daily functioning is not impaired. 9. Resolve the core conflict that is the source of anxiety. 10. Stabilize anxiety level while increasing ability to function on a daily basis. Diagnosis F43.23  Conditions For Discharge Achievement of treatment goals and objectives   Clint Bolder, LCSW

## 2023-03-02 ENCOUNTER — Ambulatory Visit (INDEPENDENT_AMBULATORY_CARE_PROVIDER_SITE_OTHER): Payer: Self-pay | Admitting: Psychology

## 2023-03-02 DIAGNOSIS — F4323 Adjustment disorder with mixed anxiety and depressed mood: Secondary | ICD-10-CM

## 2023-03-02 NOTE — Progress Notes (Signed)
[facility] Counselor/Therapist Progress Note  Patient ID: Jonathon Middleton, MRN: [id number],    Date: 03/02/2023  Time Spent: 12:00pm - 12:55pm    55 minutes   Treatment Type: Individual Therapy  Reported Symptoms: stress  Mental Status Exam: Appearance:  Casual     Behavior: Appropriate  Motor: Normal  Speech/Language:  Normal Rate  Affect: Appropriate  Mood: normal  Thought process: normal  Thought content:   WNL  Sensory/Perceptual disturbances:   WNL  Orientation: oriented to person, place, time/date, and situation  Attention: Good  Concentration: Good  Memory: WNL  Fund of knowledge:  Good  Insight:   Good  Judgment:  Good  Impulse Control: Good   Risk Assessment: Danger to Self:  No Self-injurious Behavior: No Danger to Others: No Duty to Warn:no Physical Aggression / Violence:No  Access to Firearms a concern: No  Gang Involvement:No   Subjective: Pt present for face-to-face individual therapy via video Webex.  Pt consents to telehealth video session due to COVID 19 pandemic. Location of pt: home Location of therapist: home office. Pt talked about spending time with his family for his grand daughter's 7th birthday.   Pt talked about work.  It is going better this week. 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   Pt will be on vacation next week to meet his friend [name] in [place]. Pt talked about his mood.  He states he is ok.  He is neither high nor low.  Pt has been thinking about resilience and what it takes to be resilient.  Helped pt process his thoughts and feelings.  Worked on increasing self care.  Provided supportive therapy.    Interventions: Cognitive Behavioral Therapy and Insight-Oriented  Diagnosis:  F43.23  Plan of Care: Recommend ongoing therapy.  Pt participated in setting treatment goals.  Plan to continue to meet every two weeks.   Treatment Plan (target date: 04/15/2023) Client Abilities/Strengths  Pt is bright,  engaging, and motivated for therapy.  Client Treatment Preferences  Individual therapy.  Client Statement of Needs  Improve copings skills and understand herself better. Improve self esteem.  Symptoms  Depressed or irritable mood. Excessive and/or unrealistic worry that is difficult to control occurring more days than not for at least 6 months about a number of events or activities. Hypervigilance (e.g., feeling constantly on edge, experiencing concentration difficulties, having trouble falling or staying asleep, exhibiting a general state of irritability). Low self-esteem. Problems Addressed  Unipolar Depression, Anxiety Goals 1. Alleviate depressive symptoms and return to previous level of effective functioning. 2. Appropriately grieve the loss in order to normalize mood and to return to previously adaptive level of functioning. Objective Learn and implement behavioral strategies to overcome depression. Target Date: 2023-04-15 Frequency: Biweekly  Progress: 60 Modality: individual  Related Interventions Assist the client in developing skills that increase the likelihood of deriving pleasure from behavioral activation (e.g., assertiveness skills, developing an exercise plan, less internal/more external focus, increased social involvement); reinforce success. Engage the client in "behavioral activation," increasing his/her activity level and contact with sources of reward, while identifying processes that inhibit activation. use behavioral techniques such as instruction, rehearsal, role-playing, role reversal, as needed, to facilitate activity in the client's daily life; reinforce success. 3. Develop healthy interpersonal relationships that lead to the alleviation and help prevent the relapse of depression. 4. Develop healthy thinking patterns and beliefs about self, others, and the world that lead to the alleviation and help prevent the relapse of depression.  5. Enhance ability to effectively  cope with the full variety of life's worries and anxieties. 6. Learn and implement coping skills that result in a reduction of anxiety and worry, and improved daily functioning. Objective Learn and implement problem-solving strategies for realistically addressing worries. Target Date: 2023-04-15 Frequency: Biweekly  Progress: 60 Modality: individual  Related Interventions Assign the client a homework exercise in which he/she problem-solves a current problem (see Mastery of Your Anxiety and Worry: Workbook by Adora Fridge and Eliot Ford or Generalized Anxiety Disorder by Eather Colas, and Eliot Ford); review, reinforce success, and provide corrective feedback toward improvement. Teach the client problem-solving strategies involving specifically defining a problem, generating options for addressing it, evaluating the pros and cons of each option, selecting and implementing an optional action, and reevaluating and refining the action. Objective Learn and implement calming skills to reduce overall anxiety and manage anxiety symptoms. Target Date: 2023-04-15 Frequency: Biweekly  Progress: 60 Modality: individual  Related Interventions Assign the client to read about progressive muscle relaxation and other calming strategies in relevant books or treatment manuals (e.g., Progressive Relaxation Training by Leroy Kennedy; Mastery of Your Anxiety and Worry: Workbook by Beckie Busing). Assign the client homework each session in which he/she practices relaxation exercises daily, gradually applying them progressively from non-anxiety-provoking to anxiety-provoking situations; review and reinforce success while providing corrective feedback toward improvement. Teach the client calming/relaxation skills (e.g., applied relaxation, progressive muscle relaxation, cue controlled relaxation; mindful breathing; biofeedback) and how to discriminate better between relaxation and tension; teach the client how to apply  these skills to his/her daily life. 7. Recognize, accept, and cope with feelings of depression. 8. Reduce overall frequency, intensity, and duration of the anxiety so that daily functioning is not impaired. 9. Resolve the core conflict that is the source of anxiety. 10. Stabilize anxiety level while increasing ability to function on a daily basis. Diagnosis F43.23  Conditions For Discharge Achievement of treatment goals and objectives   Clint Bolder, LCSW

## 2023-03-16 ENCOUNTER — Ambulatory Visit (INDEPENDENT_AMBULATORY_CARE_PROVIDER_SITE_OTHER): Payer: Self-pay | Admitting: Psychology

## 2023-03-16 DIAGNOSIS — F4323 Adjustment disorder with mixed anxiety and depressed mood: Secondary | ICD-10-CM

## 2023-03-16 NOTE — Progress Notes (Signed)
[facility] Counselor/Therapist Progress Note  Patient ID: Timothyjames Brodzik, MRN: [id number],    Date: 03/16/2023  Time Spent: 12:00pm - 12:55pm    55 minutes   Treatment Type: Individual Therapy  Reported Symptoms: stress  Mental Status Exam: Appearance:  Casual     Behavior: Appropriate  Motor: Normal  Speech/Language:  Normal Rate  Affect: Appropriate  Mood: normal  Thought process: normal  Thought content:   WNL  Sensory/Perceptual disturbances:   WNL  Orientation: oriented to person, place, time/date, and situation  Attention: Good  Concentration: Good  Memory: WNL  Fund of knowledge:  Good  Insight:   Good  Judgment:  Good  Impulse Control: Good   Risk Assessment: Danger to Self:  No Self-injurious Behavior: No Danger to Others: No Duty to Warn:no Physical Aggression / Violence:No  Access to Firearms a concern: No  Gang Involvement:No   Subjective: Pt present for face-to-face individual therapy via video Webex.  Pt consents to telehealth video session due to COVID 19 pandemic. Location of pt: home Location of therapist: home office. Pt talked about the recent death of his beloved dog.  Helped pt process his feelings and grief. Pt talked about feeling lonely at times.  Addressed pt's feelings and worked on coping strategies.   Pt has a couple of very good friends he stays connected with.   Worked on increasing self care.  Provided supportive therapy.    Interventions: Cognitive Behavioral Therapy and Insight-Oriented  Diagnosis:  F43.23  Plan of Care: Recommend ongoing therapy.  Pt participated in setting treatment goals.  Plan to continue to meet every two weeks.   Treatment Plan (target date: 04/15/2023) Client Abilities/Strengths  Pt is bright, engaging, and motivated for therapy.  Client Treatment Preferences  Individual therapy.  Client Statement of Needs  Improve copings skills and understand herself better. Improve self esteem.   Symptoms  Depressed or irritable mood. Excessive and/or unrealistic worry that is difficult to control occurring more days than not for at least 6 months about a number of events or activities. Hypervigilance (e.g., feeling constantly on edge, experiencing concentration difficulties, having trouble falling or staying asleep, exhibiting a general state of irritability). Low self-esteem. Problems Addressed  Unipolar Depression, Anxiety Goals 1. Alleviate depressive symptoms and return to previous level of effective functioning. 2. Appropriately grieve the loss in order to normalize mood and to return to previously adaptive level of functioning. Objective Learn and implement behavioral strategies to overcome depression. Target Date: 2023-04-15 Frequency: Biweekly  Progress: 60 Modality: individual  Related Interventions Assist the client in developing skills that increase the likelihood of deriving pleasure from behavioral activation (e.g., assertiveness skills, developing an exercise plan, less internal/more external focus, increased social involvement); reinforce success. Engage the client in "behavioral activation," increasing his/her activity level and contact with sources of reward, while identifying processes that inhibit activation. use behavioral techniques such as instruction, rehearsal, role-playing, role reversal, as needed, to facilitate activity in the client's daily life; reinforce success. 3. Develop healthy interpersonal relationships that lead to the alleviation and help prevent the relapse of depression. 4. Develop healthy thinking patterns and beliefs about self, others, and the world that lead to the alleviation and help prevent the relapse of depression. 5. Enhance ability to effectively cope with the full variety of life's worries and anxieties. 6. Learn and implement coping skills that result in a reduction of anxiety and worry, and improved daily functioning. Objective Learn  and implement problem-solving strategies for realistically addressing  worries. Target Date: 2023-04-15 Frequency: Biweekly  Progress: 60 Modality: individual  Related Interventions Assign the client a homework exercise in which he/she problem-solves a current problem (see Mastery of Your Anxiety and Worry: Workbook by Elenora Fender and Filbert Schilder or Generalized Anxiety Disorder by Elesa Hacker, and Filbert Schilder); review, reinforce success, and provide corrective feedback toward improvement. Teach the client problem-solving strategies involving specifically defining a problem, generating options for addressing it, evaluating the pros and cons of each option, selecting and implementing an optional action, and reevaluating and refining the action. Objective Learn and implement calming skills to reduce overall anxiety and manage anxiety symptoms. Target Date: 2023-04-15 Frequency: Biweekly  Progress: 60 Modality: individual  Related Interventions Assign the client to read about progressive muscle relaxation and other calming strategies in relevant books or treatment manuals (e.g., Progressive Relaxation Training by Twana First; Mastery of Your Anxiety and Worry: Workbook by Earlie Counts). Assign the client homework each session in which he/she practices relaxation exercises daily, gradually applying them progressively from non-anxiety-provoking to anxiety-provoking situations; review and reinforce success while providing corrective feedback toward improvement. Teach the client calming/relaxation skills (e.g., applied relaxation, progressive muscle relaxation, cue controlled relaxation; mindful breathing; biofeedback) and how to discriminate better between relaxation and tension; teach the client how to apply these skills to his/her daily life. 7. Recognize, accept, and cope with feelings of depression. 8. Reduce overall frequency, intensity, and duration of the anxiety so that daily functioning is not  impaired. 9. Resolve the core conflict that is the source of anxiety. 10. Stabilize anxiety level while increasing ability to function on a daily basis. Diagnosis F43.23  Conditions For Discharge Achievement of treatment goals and objectives   Salomon Fick, LCSW

## 2023-03-30 ENCOUNTER — Ambulatory Visit (INDEPENDENT_AMBULATORY_CARE_PROVIDER_SITE_OTHER): Payer: Self-pay | Admitting: Psychology

## 2023-03-30 DIAGNOSIS — F4323 Adjustment disorder with mixed anxiety and depressed mood: Secondary | ICD-10-CM

## 2023-03-30 NOTE — Progress Notes (Signed)
[facility] Counselor/Therapist Progress Note  Patient ID: Jonathon Middleton, MRN: [id number],    Date: 03/30/2023  Time Spent: 12:00pm - 12:55pm    55 minutes   Treatment Type: Individual Therapy  Reported Symptoms: stress  Mental Status Exam: Appearance:  Casual     Behavior: Appropriate  Motor: Normal  Speech/Language:  Normal Rate  Affect: Appropriate  Mood: normal  Thought process: normal  Thought content:   WNL  Sensory/Perceptual disturbances:   WNL  Orientation: oriented to person, place, time/date, and situation  Attention: Good  Concentration: Good  Memory: WNL  Fund of knowledge:  Good  Insight:   Good  Judgment:  Good  Impulse Control: Good   Risk Assessment: Danger to Self:  No Self-injurious Behavior: No Danger to Others: No Duty to Warn:no Physical Aggression / Violence:No  Access to Firearms a concern: No  Gang Involvement:No   Subjective: Pt present for face-to-face individual therapy via video.  Pt consents to telehealth video session due to COVID 19 pandemic. Location of pt: home Location of therapist: home office. Pt talked about work.  He is very busy but is not feeling as motivated as he was a few weeks ago.   This is frustrating for pt.   Pt has realized that if he is not perfect at doing something he loses interest.   Addressed pt's issues with perfectionism. Pt talked about feeling lonely at times.  Addressed pt's feelings and worked on coping strategies.    Worked on increasing self care.  Provided supportive therapy.    Interventions: Cognitive Behavioral Therapy and Insight-Oriented  Diagnosis:  F43.23  Plan of Care: Recommend ongoing therapy.  Pt participated in setting treatment goals.  Plan to continue to meet every two weeks.   Treatment Plan (target date: 04/15/2023) Client Abilities/Strengths  Pt is bright, engaging, and motivated for therapy.  Client Treatment Preferences  Individual therapy.  Client Statement  of Needs  Improve copings skills and understand herself better. Improve self esteem.  Symptoms  Depressed or irritable mood. Excessive and/or unrealistic worry that is difficult to control occurring more days than not for at least 6 months about a number of events or activities. Hypervigilance (e.g., feeling constantly on edge, experiencing concentration difficulties, having trouble falling or staying asleep, exhibiting a general state of irritability). Low self-esteem. Problems Addressed  Unipolar Depression, Anxiety Goals 1. Alleviate depressive symptoms and return to previous level of effective functioning. 2. Appropriately grieve the loss in order to normalize mood and to return to previously adaptive level of functioning. Objective Learn and implement behavioral strategies to overcome depression. Target Date: 2023-04-15 Frequency: Biweekly  Progress: 60 Modality: individual  Related Interventions Assist the client in developing skills that increase the likelihood of deriving pleasure from behavioral activation (e.g., assertiveness skills, developing an exercise plan, less internal/more external focus, increased social involvement); reinforce success. Engage the client in "behavioral activation," increasing his/her activity level and contact with sources of reward, while identifying processes that inhibit activation. use behavioral techniques such as instruction, rehearsal, role-playing, role reversal, as needed, to facilitate activity in the client's daily life; reinforce success. 3. Develop healthy interpersonal relationships that lead to the alleviation and help prevent the relapse of depression. 4. Develop healthy thinking patterns and beliefs about self, others, and the world that lead to the alleviation and help prevent the relapse of depression. 5. Enhance ability to effectively cope with the full variety of life's worries and anxieties. 6. Learn and implement coping skills that result  in a reduction of anxiety and worry, and improved daily functioning. Objective Learn and implement problem-solving strategies for realistically addressing worries. Target Date: 2023-04-15 Frequency: Biweekly  Progress: 60 Modality: individual  Related Interventions Assign the client a homework exercise in which he/she problem-solves a current problem (see Mastery of Your Anxiety and Worry: Workbook by Elenora Fender and Filbert Schilder or Generalized Anxiety Disorder by Elesa Hacker, and Filbert Schilder); review, reinforce success, and provide corrective feedback toward improvement. Teach the client problem-solving strategies involving specifically defining a problem, generating options for addressing it, evaluating the pros and cons of each option, selecting and implementing an optional action, and reevaluating and refining the action. Objective Learn and implement calming skills to reduce overall anxiety and manage anxiety symptoms. Target Date: 2023-04-15 Frequency: Biweekly  Progress: 60 Modality: individual  Related Interventions Assign the client to read about progressive muscle relaxation and other calming strategies in relevant books or treatment manuals (e.g., Progressive Relaxation Training by Twana First; Mastery of Your Anxiety and Worry: Workbook by Earlie Counts). Assign the client homework each session in which he/she practices relaxation exercises daily, gradually applying them progressively from non-anxiety-provoking to anxiety-provoking situations; review and reinforce success while providing corrective feedback toward improvement. Teach the client calming/relaxation skills (e.g., applied relaxation, progressive muscle relaxation, cue controlled relaxation; mindful breathing; biofeedback) and how to discriminate better between relaxation and tension; teach the client how to apply these skills to his/her daily life. 7. Recognize, accept, and cope with feelings of depression. 8. Reduce  overall frequency, intensity, and duration of the anxiety so that daily functioning is not impaired. 9. Resolve the core conflict that is the source of anxiety. 10. Stabilize anxiety level while increasing ability to function on a daily basis. Diagnosis F43.23  Conditions For Discharge Achievement of treatment goals and objectives   Salomon Fick, LCSW

## 2023-04-07 ENCOUNTER — Telehealth: Payer: Self-pay | Admitting: Family Medicine

## 2023-04-07 NOTE — Telephone Encounter (Signed)
Chart shows rx for Accu-Chek Guide test strips was sent on 01/16/23, #100/3 to [facility]   Lvm asking pt to call back. Need to know why pt is requesting new meter so soon.

## 2023-04-07 NOTE — Telephone Encounter (Signed)
Prescription Request  04/07/2023  LOV: 12/28/2022  What is the name of the medication or equipment? glucose blood (ACCU-CHEK GUIDE) test strip   Have you contacted your pharmacy to request a refill? Yes   Which pharmacy would you like this sent to?  [facility] - [place], [place] - [address] [place] [address] Phone: [phone] Fax: [phone]    Patient notified that their request is being sent to the clinical staff for review and that they should receive a response within 2 business days.   Please advise at Mobile [phone] (mobile)

## 2023-04-10 NOTE — Telephone Encounter (Signed)
Spoke with pt relaying message below concerning test strips. Pt verbalizes understanding and states he was aware. But says the pharmacy says they didn't get rx for test strips.  Spoke with [facility] mail order pharmacy asking about rx. States a note was attached stating it is on hold. Says it seems the order in pt's acct is for box of #50 test strips but they show rx for #100. The acct has been corrected (ticket [id number]). Recommends waiting to closer to end of business day and pt should received a message notifying him he can order the refill.    Spoke with pt relaying message from pharmacy. Pt verbalizes understanding and expresses his thanks for getting everything straightened out.

## 2023-04-13 ENCOUNTER — Ambulatory Visit: Payer: Self-pay | Admitting: Psychology

## 2023-04-27 ENCOUNTER — Ambulatory Visit (INDEPENDENT_AMBULATORY_CARE_PROVIDER_SITE_OTHER): Payer: Self-pay | Admitting: Psychology

## 2023-04-27 DIAGNOSIS — F4323 Adjustment disorder with mixed anxiety and depressed mood: Secondary | ICD-10-CM

## 2023-04-27 NOTE — Progress Notes (Signed)
[facility] Counselor Initial Adult Exam  Name: Jonathon Middleton Date: 04/27/2023 MRN: [id number] DOB: 1950-12-29 PCP: Eustaquio Boyden, MD  Time spent: 12:00pm-12:55pm   55 minutes  Guardian/Payee:  Donnamarie Poag requested: No   Reason for Visit /Presenting Problem:  Pt present for face-to-face initial assessment update via video.  Pt consents to telehealth video session due to COVID 19 pandemic. Location of pt: home Location of therapist: home office.  Pt has issues of negative self talk and low self esteem.  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  Pt has issues with perfectionism and codependency.  Reviewed pt's treatment plan for annual update.  Updated pt's treatment plan and IA.   Pt participated in setting treatment goals.   Pt wants to continue to work on self esteem and his feelings of "not feeling good enough."  Pt wants to work on self doubt and issues of perfectionism.  He wants to work on being more resilient.  Plan to continue to meet every two weeks.     Mental Status Exam: Appearance:   Casual     Behavior:  Appropriate  Motor:  Normal  Speech/Language:   Normal Rate  Affect:  Appropriate  Mood:  normal  Thought process:  normal  Thought content:    WNL  Sensory/Perceptual disturbances:    WNL  Orientation:  oriented to person, place, time/date, and situation  Attention:  Good  Concentration:  Good  Memory:  WNL  Fund of knowledge:   Good  Insight:    Good  Judgment:   Good  Impulse Control:  Good    Reported Symptoms:  stress, loneliness  Risk Assessment: Danger to Self:  No Self-injurious Behavior: No Danger to Others: No Duty to Warn:no Physical Aggression / Violence:No  Access to Firearms a concern: No  Gang Involvement:No  Patient / guardian was educated about steps to take if suicide or homicide risk level increases between visits: n/a While future psychiatric events cannot be accurately predicted, the patient does  not currently require acute inpatient psychiatric care and does not currently meet [facility] involuntary commitment criteria.  Substance Abuse History: Current substance abuse: No     Past Psychiatric History:   Previous psychological history is significant for depression Outpatient Providers:pt has been in therapy in the past. History of Psych Hospitalization: No  Psychological Testing:  n/a    Abuse History:  Victim of: No.,  n/a    Report needed: No. Victim of Neglect:No. Perpetrator of  n/a   Witness / Exposure to Domestic Violence: No   Protective Services Involvement: No  Witness to [employer] Violence:  No   Family History:  Family History  Problem Relation Age of Onset   Hypertension Mother    CAD Father 16       massive MI, deceased   CAD Paternal Uncle 95       MI, smoker   Stroke Paternal Aunt    Diabetes Paternal Grandmother    Cancer Maternal Uncle        lung cancer (non small cell), smoker    Living situation: the patient lives alone  Pt's father died when he was 20.  Father had massive heart attack and died suddenly at age 19.   Mother never remarried.  Pt was only child.  Pt did not attend funeral.  Pt does not know how he grieved. Pt was very involved in a church that was supportive.  Pt played guitar.  No family history of mental illness.   Sexual Orientation: Straight  Relationship Status: divorced  Name of spouse / other:n/a If a parent, number of children / ages:  pt has 2 adult sons.   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  He has put all his efforts into his job now.  Pt has been at this job for 6 years.    Pt has college degree.   He has worked in Airline pilot.   Military Service: No   Educational History: Education: Risk manager: Protestant  Any cultural differences that may  affect / interfere with treatment:  not applicable   Recreation/Hobbies: gardening, reading, fishing  Stressors: Other: job stress, loneliness at times.    Strengths: Supportive Relationships, Family, Hopefulness, Self Advocate, and Able to Communicate Effectively  Barriers:  none   Legal History: Pending legal issue / charges: The patient has no significant history of legal issues. History of legal issue / charges:  n/a  Medical History/Surgical History: reviewed Past Medical History:  Diagnosis Date   Arthritis    Atrial fibrillation (HCC)    Depression    Depression with anxiety    GERD (gastroesophageal reflux disease)    08/24/18- not current   History of chicken pox    Humerus fracture    x 2   Hyperlipidemia    Hypertension    Nonalcoholic fatty liver disease    ?h/o elevated LFTs, has not had [place]   Pre-diabetes    Metobolic Syndrome   Prediabetes    treated with diet & exercise. saw [facility] nutritionist 04/2014    Past Surgical History:  Procedure Laterality Date   COLONOSCOPY     HARDWARE REMOVAL Right 09/21/2017   Procedure: HARDWARE REMOVAL OF RIGHT HUMERUS;  Surgeon: Tarry Kos, MD;  Location: [facility] OR;  Service: Orthopedics;  Laterality: Right;   KNEE SURGERY Right 2009   x 2 (Dr. Ernest Pine)  Arthroscopy- torn MCL   ORIF HUMERUS FRACTURE Right 08/25/2017   Procedure: OPEN REDUCTION INTERNAL FIXATION (ORIF) RIGHT HUMERAL SHAFT FRACTURE;  Surgeon: Tarry Kos, MD;  Location: [facility] OR;  Service: Orthopedics;  Laterality: Right;   ORIF HUMERUS FRACTURE Right 09/21/2017   Procedure: OPEN REDUCTION INTERNAL FIXATION (ORIF) RIGHT HUMERAL SHAFT FRACTURE;  Surgeon: Tarry Kos, MD;  Location: [facility] OR;  Service: Orthopedics;  Laterality: Right;    Medications: Current Outpatient Medications  Medication Sig Dispense Refill   Accu-Chek Softclix Lancets lancets Use as instructed to check blood sugar once daily 100 each 3   apixaban (ELIQUIS) 5 MG TABS tablet Take 1 tablet (5  mg total) by mouth 2 (two) times daily. 180 tablet 3   atorvastatin (LIPITOR) 40 MG tablet Take 1 tablet (40 mg total) by mouth daily. 90 tablet 3   Blood Glucose Monitoring Suppl (ACCU-CHEK GUIDE ME) w/Device KIT Use as instructed to check blood sugar once daily 1 kit 0   Cholecalciferol (VITAMIN D) 2000 units CAPS Take 2,000 Units by mouth 2 (two) times daily.      Coenzyme Q10 (CO Q-10) 100 MG CAPS Take 100 mg by mouth daily with breakfast.      diltiazem (CARDIZEM CD) 120 MG 24 hr capsule TAKE 1 CAPSULE BY MOUTH EVERY DAY 90 capsule 3   glucose blood (ACCU-CHEK GUIDE) test strip Use as instructed to check blood sugar once daily 100 each 3  losartan-hydrochlorothiazide (HYZAAR) 50-12.5 MG tablet Take 1 tablet by mouth daily. 90 tablet 3   metoprolol succinate (TOPROL-XL) 50 MG 24 hr tablet Take 1 tablet (50 mg total) by mouth daily. Take with or immediately following a meal. 90 tablet 3   omega-3 fish oil (MAXEPA) 1000 MG CAPS capsule Take 1 capsule by mouth 2 (two) times daily.      sertraline (ZOLOFT) 100 MG tablet Take 1 tablet (100 mg total) by mouth daily. 90 tablet 3   TURMERIC PO Take by mouth in the morning and at bedtime.     No current facility-administered medications for this visit.    No Known Allergies  Diagnoses:  F43.23  Plan of Care: Recommend ongoing therapy.  Pt participated in setting treatment goals.  Plan to continue to meet every two weeks.   Treatment Plan Client Abilities/Strengths  Pt is bright, engaging, and motivated for therapy.  Client Treatment Preferences  Individual therapy.  Client Statement of Needs  Improve copings skills and understand herself better. Improve self esteem.  Symptoms  Depressed or irritable mood. Excessive and/or unrealistic worry that is difficult to control occurring more days than not for at least 6 months about a number of events or activities. Hypervigilance (e.g., feeling constantly on edge, experiencing concentration  difficulties, having trouble falling or staying asleep, exhibiting a general state of irritability). Low self-esteem. Problems Addressed  Unipolar Depression, Anxiety Goals 1. Alleviate depressive symptoms and return to previous level of effective functioning. 2. Appropriately grieve the loss in order to normalize mood and to return to previously adaptive level of functioning. Objective Learn and implement behavioral strategies to overcome depression. Target Date: 2024-04-26 Frequency: Biweekly  Progress: 65 Modality: individual  Related Interventions Assist the client in developing skills that increase the likelihood of deriving pleasure from behavioral activation (e.g., assertiveness skills, developing an exercise plan, less internal/more external focus, increased social involvement); reinforce success. Engage the client in "behavioral activation," increasing his/her activity level and contact with sources of reward, while identifying processes that inhibit activation. use behavioral techniques such as instruction, rehearsal, role-playing, role reversal, as needed, to facilitate activity in the client's daily life; reinforce success. 3. Develop healthy interpersonal relationships that lead to the alleviation and help prevent the relapse of depression. 4. Develop healthy thinking patterns and beliefs about self, others, and the world that lead to the alleviation and help prevent the relapse of depression. 5. Enhance ability to effectively cope with the full variety of life's worries and anxieties. 6. Learn and implement coping skills that result in a reduction of anxiety and worry, and improved daily functioning. Objective Learn and implement problem-solving strategies for realistically addressing worries. Target Date: 2024-04-26 Frequency: Biweekly  Progress: 65 Modality: individual  Related Interventions Assign the client a homework exercise in which he/she problem-solves a current problem  (see Mastery of Your Anxiety and Worry: Workbook by Elenora Fender and Filbert Schilder or Generalized Anxiety Disorder by Elesa Hacker, and Filbert Schilder); review, reinforce success, and provide corrective feedback toward improvement. Teach the client problem-solving strategies involving specifically defining a problem, generating options for addressing it, evaluating the pros and cons of each option, selecting and implementing an optional action, and reevaluating and refining the action. Objective Learn and implement calming skills to reduce overall anxiety and manage anxiety symptoms. Target Date: 2024-04-26 Frequency: Biweekly  Progress: 65 Modality: individual  Related Interventions Assign the client to read about progressive muscle relaxation and other calming strategies in relevant books or treatment manuals (e.g., Progressive Relaxation Training  by Robb Matar and Alen Blew; Mastery of Your Anxiety and Worry: Workbook by Earlie Counts). Assign the client homework each session in which he/she practices relaxation exercises daily, gradually applying them progressively from non-anxiety-provoking to anxiety-provoking situations; review and reinforce success while providing corrective feedback toward improvement. Teach the client calming/relaxation skills (e.g., applied relaxation, progressive muscle relaxation, cue controlled relaxation; mindful breathing; biofeedback) and how to discriminate better between relaxation and tension; teach the client how to apply these skills to his/her daily life. 7. Recognize, accept, and cope with feelings of depression. 8. Reduce overall frequency, intensity, and duration of the anxiety so that daily functioning is not impaired. 9. Resolve the core conflict that is the source of anxiety. 10. Stabilize anxiety level while increasing ability to function on a daily basis. Diagnosis F43.23  Conditions For Discharge Achievement of treatment goals and objectives   Salomon Fick, LCSW

## 2023-05-11 ENCOUNTER — Ambulatory Visit (INDEPENDENT_AMBULATORY_CARE_PROVIDER_SITE_OTHER): Payer: Self-pay | Admitting: Psychology

## 2023-05-11 DIAGNOSIS — F4323 Adjustment disorder with mixed anxiety and depressed mood: Secondary | ICD-10-CM

## 2023-05-11 NOTE — Progress Notes (Signed)
[facility] Counselor/Therapist Progress Note  Patient ID: Jonathon Middleton, MRN: [id number],    Date: 05/11/2023  Time Spent: 12:00pm-12:55pm   55 minutes   Treatment Type: Individual Therapy  Reported Symptoms: stress  Mental Status Exam: Appearance:  Casual     Behavior: Appropriate  Motor: Normal  Speech/Language:  Normal Rate  Affect: Appropriate  Mood: normal  Thought process: normal  Thought content:   WNL  Sensory/Perceptual disturbances:   WNL  Orientation: oriented to person, place, time/date, and situation  Attention: Good  Concentration: Good  Memory: WNL  Fund of knowledge:  Good  Insight:   Good  Judgment:  Good  Impulse Control: Good   Risk Assessment: Danger to Self:  No Self-injurious Behavior: No Danger to Others: No Duty to Warn:no Physical Aggression / Violence:No  Access to Firearms a concern: No  Gang Involvement:No   Subjective: Pt present for face-to-face individual therapy via video.  Pt consents to telehealth video session due to COVID 19 pandemic. Location of pt: home Location of therapist: home office.   Pt talked about concerns about his friend Jonathon Middleton.   [name] 3 yo mother is sick.   Pt talked about his family.  He got together with his sons for one of his son's birthday and it went well.  Pt talked about work.  It has been slow lately which is frustrating.  Pt also worries about finances.   Pt has been thinking about his therapy goals.   Addressed how pt can build on current goals.  Pt wants to build on resilience.  Addressed how pt can not "guard against" fears of what could happen.   Identified how he can embrace a "yes and" approach.   Worked on self care strategies. Provided supportive therapy.   Interventions: Cognitive Behavioral Therapy and Insight-Oriented  Diagnosis:  F43.23   Plan of Care: Recommend ongoing therapy.  Pt participated in setting treatment goals.  Plan to continue to meet every two weeks.    Treatment Plan Client Abilities/Strengths  Pt is bright, engaging, and motivated for therapy.  Client Treatment Preferences  Individual therapy.  Client Statement of Needs  Improve copings skills and understand herself better. Improve self esteem.  Symptoms  Depressed or irritable mood. Excessive and/or unrealistic worry that is difficult to control occurring more days than not for at least 6 months about a number of events or activities. Hypervigilance (e.g., feeling constantly on edge, experiencing concentration difficulties, having trouble falling or staying asleep, exhibiting a general state of irritability). Low self-esteem. Problems Addressed  Unipolar Depression, Anxiety Goals 1. Alleviate depressive symptoms and return to previous level of effective functioning. 2. Appropriately grieve the loss in order to normalize mood and to return to previously adaptive level of functioning. Objective Learn and implement behavioral strategies to overcome depression. Target Date: 2024-04-26 Frequency: Biweekly  Progress: 65 Modality: individual  Related Interventions Assist the client in developing skills that increase the likelihood of deriving pleasure from behavioral activation (e.g., assertiveness skills, developing an exercise plan, less internal/more external focus, increased social involvement); reinforce success. Engage the client in "behavioral activation," increasing his/her activity level and contact with sources of reward, while identifying processes that inhibit activation. use behavioral techniques such as instruction, rehearsal, role-playing, role reversal, as needed, to facilitate activity in the client's daily life; reinforce success. 3. Develop healthy interpersonal relationships that lead to the alleviation and help prevent the relapse of depression. 4. Develop healthy thinking patterns and beliefs about self, others, and the  world that lead to the alleviation and help prevent  the relapse of depression. 5. Enhance ability to effectively cope with the full variety of life's worries and anxieties. 6. Learn and implement coping skills that result in a reduction of anxiety and worry, and improved daily functioning. Objective Learn and implement problem-solving strategies for realistically addressing worries. Target Date: 2024-04-26 Frequency: Biweekly  Progress: 65 Modality: individual  Related Interventions Assign the client a homework exercise in which he/she problem-solves a current problem (see Mastery of Your Anxiety and Worry: Workbook by Elenora Fender and Filbert Schilder or Generalized Anxiety Disorder by Elesa Hacker, and Filbert Schilder); review, reinforce success, and provide corrective feedback toward improvement. Teach the client problem-solving strategies involving specifically defining a problem, generating options for addressing it, evaluating the pros and cons of each option, selecting and implementing an optional action, and reevaluating and refining the action. Objective Learn and implement calming skills to reduce overall anxiety and manage anxiety symptoms. Target Date: 2024-04-26 Frequency: Biweekly  Progress: 65 Modality: individual  Related Interventions Assign the client to read about progressive muscle relaxation and other calming strategies in relevant books or treatment manuals (e.g., Progressive Relaxation Training by Twana First; Mastery of Your Anxiety and Worry: Workbook by Earlie Counts). Assign the client homework each session in which he/she practices relaxation exercises daily, gradually applying them progressively from non-anxiety-provoking to anxiety-provoking situations; review and reinforce success while providing corrective feedback toward improvement. Teach the client calming/relaxation skills (e.g., applied relaxation, progressive muscle relaxation, cue controlled relaxation; mindful breathing; biofeedback) and how to discriminate better  between relaxation and tension; teach the client how to apply these skills to his/her daily life. 7. Recognize, accept, and cope with feelings of depression. 8. Reduce overall frequency, intensity, and duration of the anxiety so that daily functioning is not impaired. 9. Resolve the core conflict that is the source of anxiety. 10. Stabilize anxiety level while increasing ability to function on a daily basis. Diagnosis F43.23  Conditions For Discharge Achievement of treatment goals and objectives   Salomon Fick, LCSW

## 2023-05-25 ENCOUNTER — Ambulatory Visit (INDEPENDENT_AMBULATORY_CARE_PROVIDER_SITE_OTHER): Payer: Self-pay | Admitting: Psychology

## 2023-05-25 DIAGNOSIS — F4323 Adjustment disorder with mixed anxiety and depressed mood: Secondary | ICD-10-CM

## 2023-05-25 NOTE — Progress Notes (Signed)
[facility] Counselor/Therapist Progress Note  Patient ID: Jonathon Middleton, MRN: [id number],    Date: 05/25/2023  Time Spent: 12:00pm-12:55pm   55 minutes   Treatment Type: Individual Therapy  Reported Symptoms: stress  Mental Status Exam: Appearance:  Casual     Behavior: Appropriate  Motor: Normal  Speech/Language:  Normal Rate  Affect: Appropriate  Mood: normal  Thought process: normal  Thought content:   WNL  Sensory/Perceptual disturbances:   WNL  Orientation: oriented to person, place, time/date, and situation  Attention: Good  Concentration: Good  Memory: WNL  Fund of knowledge:  Good  Insight:   Good  Judgment:  Good  Impulse Control: Good   Risk Assessment: Danger to Self:  No Self-injurious Behavior: No Danger to Others: No Duty to Warn:no Physical Aggression / Violence:No  Access to Firearms a concern: No  Gang Involvement:No   Subjective: Pt present for face-to-face individual therapy via video.  Pt consents to telehealth video session and is aware of limitations of virtual sessions. Location of pt: home Location of therapist: home office.   Pt talked about having a good father's day with his family.   Pt talked about having a bout of afib that was concerning.  Addressed pt's health concerns.  Pt talked about how "fear bombards him" and how he copes with it.  Helped pt process the issues and his feelings.  Worked on worry management and thought reframing.   Pt states he has barriers with faith bc of his background with religion.  Worked with pt on redefining faith as believing in any higher power that is bigger than himself.  Pt identified "the universe" as something he can attach faith to.  Pt talked about work.  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   Pt can tend to be fearful when work is slow.  Worked on self care strategies. Provided supportive therapy.   Interventions: Cognitive Behavioral Therapy and  Insight-Oriented  Diagnosis:  F43.23   Plan of Care: Recommend ongoing therapy.  Pt participated in setting treatment goals.  Plan to continue to meet every two weeks.   Treatment Plan Client Abilities/Strengths  Pt is bright, engaging, and motivated for therapy.  Client Treatment Preferences  Individual therapy.  Client Statement of Needs  Improve copings skills and understand herself better. Improve self esteem.  Symptoms  Depressed or irritable mood. Excessive and/or unrealistic worry that is difficult to control occurring more days than not for at least 6 months about a number of events or activities. Hypervigilance (e.g., feeling constantly on edge, experiencing concentration difficulties, having trouble falling or staying asleep, exhibiting a general state of irritability). Low self-esteem. Problems Addressed  Unipolar Depression, Anxiety Goals 1. Alleviate depressive symptoms and return to previous level of effective functioning. 2. Appropriately grieve the loss in order to normalize mood and to return to previously adaptive level of functioning. Objective Learn and implement behavioral strategies to overcome depression. Target Date: 2024-04-26 Frequency: Biweekly  Progress: 65 Modality: individual  Related Interventions Assist the client in developing skills that increase the likelihood of deriving pleasure from behavioral activation (e.g., assertiveness skills, developing an exercise plan, less internal/more external focus, increased social involvement); reinforce success. Engage the client in "behavioral activation," increasing his/her activity level and contact with sources of reward, while identifying processes that inhibit activation. use behavioral techniques such as instruction, rehearsal, role-playing, role reversal, as needed, to facilitate activity in the client's daily life; reinforce success. 3. Develop healthy interpersonal relationships that  lead to the alleviation  and help prevent the relapse of depression. 4. Develop healthy thinking patterns and beliefs about self, others, and the world that lead to the alleviation and help prevent the relapse of depression. 5. Enhance ability to effectively cope with the full variety of life's worries and anxieties. 6. Learn and implement coping skills that result in a reduction of anxiety and worry, and improved daily functioning. Objective Learn and implement problem-solving strategies for realistically addressing worries. Target Date: 2024-04-26 Frequency: Biweekly  Progress: 65 Modality: individual  Related Interventions Assign the client a homework exercise in which he/she problem-solves a current problem (see Mastery of Your Anxiety and Worry: Workbook by Elenora Fender and Filbert Schilder or Generalized Anxiety Disorder by Elesa Hacker, and Filbert Schilder); review, reinforce success, and provide corrective feedback toward improvement. Teach the client problem-solving strategies involving specifically defining a problem, generating options for addressing it, evaluating the pros and cons of each option, selecting and implementing an optional action, and reevaluating and refining the action. Objective Learn and implement calming skills to reduce overall anxiety and manage anxiety symptoms. Target Date: 2024-04-26 Frequency: Biweekly  Progress: 65 Modality: individual  Related Interventions Assign the client to read about progressive muscle relaxation and other calming strategies in relevant books or treatment manuals (e.g., Progressive Relaxation Training by Twana First; Mastery of Your Anxiety and Worry: Workbook by Earlie Counts). Assign the client homework each session in which he/she practices relaxation exercises daily, gradually applying them progressively from non-anxiety-provoking to anxiety-provoking situations; review and reinforce success while providing corrective feedback toward improvement. Teach the client  calming/relaxation skills (e.g., applied relaxation, progressive muscle relaxation, cue controlled relaxation; mindful breathing; biofeedback) and how to discriminate better between relaxation and tension; teach the client how to apply these skills to his/her daily life. 7. Recognize, accept, and cope with feelings of depression. 8. Reduce overall frequency, intensity, and duration of the anxiety so that daily functioning is not impaired. 9. Resolve the core conflict that is the source of anxiety. 10. Stabilize anxiety level while increasing ability to function on a daily basis. Diagnosis F43.23  Conditions For Discharge Achievement of treatment goals and objectives   Salomon Fick, LCSW

## 2023-06-06 LAB — HM DIABETES EYE EXAM

## 2023-06-09 ENCOUNTER — Ambulatory Visit (INDEPENDENT_AMBULATORY_CARE_PROVIDER_SITE_OTHER): Payer: Self-pay | Admitting: Psychology

## 2023-06-09 DIAGNOSIS — F4323 Adjustment disorder with mixed anxiety and depressed mood: Secondary | ICD-10-CM

## 2023-06-09 NOTE — Progress Notes (Signed)
[facility] Counselor/Therapist Progress Note  Patient ID: Jonathon Middleton, MRN: [id number],    Date: 06/09/2023  Time Spent: 12:00pm-12:55pm   55 minutes   Treatment Type: Individual Therapy  Reported Symptoms: stress  Mental Status Exam: Appearance:  Casual     Behavior: Appropriate  Motor: Normal  Speech/Language:  Normal Rate  Affect: Appropriate  Mood: normal  Thought process: normal  Thought content:   WNL  Sensory/Perceptual disturbances:   WNL  Orientation: oriented to person, place, time/date, and situation  Attention: Good  Concentration: Good  Memory: WNL  Fund of knowledge:  Good  Insight:   Good  Judgment:  Good  Impulse Control: Good   Risk Assessment: Danger to Self:  No Self-injurious Behavior: No Danger to Others: No Duty to Warn:no Physical Aggression / Violence:No  Access to Firearms a concern: No  Gang Involvement:No   Subjective: Pt present for face-to-face individual therapy via video.  Pt consents to telehealth video session and is aware of limitations of virtual sessions. Location of pt: home Location of therapist: home office.   Pt talked about his health.  He has had some afib episodes the past week.  Addressed pt's health concerns and how they impact him. Pt talked about his thoughts about resilience.  He has been journaling about it.  Pt states he has been trying to work on not over thinking so much this past year.   Pt wants to address his fears and how they impact his life.  Encouraged pt to journal about fear as it presents itself.  Worked on self care strategies. Provided supportive therapy.   Interventions: Cognitive Behavioral Therapy and Insight-Oriented  Diagnosis:  F43.23   Plan of Care: Recommend ongoing therapy.  Pt participated in setting treatment goals.  Plan to continue to meet every two weeks.   Treatment Plan Client Abilities/Strengths  Pt is bright, engaging, and motivated for therapy.  Client  Treatment Preferences  Individual therapy.  Client Statement of Needs  Improve copings skills and understand herself better. Improve self esteem.  Symptoms  Depressed or irritable mood. Excessive and/or unrealistic worry that is difficult to control occurring more days than not for at least 6 months about a number of events or activities. Hypervigilance (e.g., feeling constantly on edge, experiencing concentration difficulties, having trouble falling or staying asleep, exhibiting a general state of irritability). Low self-esteem. Problems Addressed  Unipolar Depression, Anxiety Goals 1. Alleviate depressive symptoms and return to previous level of effective functioning. 2. Appropriately grieve the loss in order to normalize mood and to return to previously adaptive level of functioning. Objective Learn and implement behavioral strategies to overcome depression. Target Date: 2024-04-26 Frequency: Biweekly  Progress: 65 Modality: individual  Related Interventions Assist the client in developing skills that increase the likelihood of deriving pleasure from behavioral activation (e.g., assertiveness skills, developing an exercise plan, less internal/more external focus, increased social involvement); reinforce success. Engage the client in "behavioral activation," increasing his/her activity level and contact with sources of reward, while identifying processes that inhibit activation. use behavioral techniques such as instruction, rehearsal, role-playing, role reversal, as needed, to facilitate activity in the client's daily life; reinforce success. 3. Develop healthy interpersonal relationships that lead to the alleviation and help prevent the relapse of depression. 4. Develop healthy thinking patterns and beliefs about self, others, and the world that lead to the alleviation and help prevent the relapse of depression. 5. Enhance ability to effectively cope with the full variety of life's worries  and anxieties. 6. Learn and implement coping skills that result in a reduction of anxiety and worry, and improved daily functioning. Objective Learn and implement problem-solving strategies for realistically addressing worries. Target Date: 2024-04-26 Frequency: Biweekly  Progress: 65 Modality: individual  Related Interventions Assign the client a homework exercise in which he/she problem-solves a current problem (see Mastery of Your Anxiety and Worry: Workbook by Elenora Fender and Filbert Schilder or Generalized Anxiety Disorder by Elesa Hacker, and Filbert Schilder); review, reinforce success, and provide corrective feedback toward improvement. Teach the client problem-solving strategies involving specifically defining a problem, generating options for addressing it, evaluating the pros and cons of each option, selecting and implementing an optional action, and reevaluating and refining the action. Objective Learn and implement calming skills to reduce overall anxiety and manage anxiety symptoms. Target Date: 2024-04-26 Frequency: Biweekly  Progress: 65 Modality: individual  Related Interventions Assign the client to read about progressive muscle relaxation and other calming strategies in relevant books or treatment manuals (e.g., Progressive Relaxation Training by Twana First; Mastery of Your Anxiety and Worry: Workbook by Earlie Counts). Assign the client homework each session in which he/she practices relaxation exercises daily, gradually applying them progressively from non-anxiety-provoking to anxiety-provoking situations; review and reinforce success while providing corrective feedback toward improvement. Teach the client calming/relaxation skills (e.g., applied relaxation, progressive muscle relaxation, cue controlled relaxation; mindful breathing; biofeedback) and how to discriminate better between relaxation and tension; teach the client how to apply these skills to his/her daily life. 7.  Recognize, accept, and cope with feelings of depression. 8. Reduce overall frequency, intensity, and duration of the anxiety so that daily functioning is not impaired. 9. Resolve the core conflict that is the source of anxiety. 10. Stabilize anxiety level while increasing ability to function on a daily basis. Diagnosis F43.23  Conditions For Discharge Achievement of treatment goals and objectives   Salomon Fick, LCSW

## 2023-06-28 ENCOUNTER — Encounter: Payer: Self-pay | Admitting: Family Medicine

## 2023-06-28 ENCOUNTER — Ambulatory Visit: Payer: Self-pay | Admitting: Family Medicine

## 2023-06-28 VITALS — BP 124/70 | HR 64 | Temp 97.3°F | Ht 69.0 in | Wt 208.2 lb

## 2023-06-28 DIAGNOSIS — E1169 Type 2 diabetes mellitus with other specified complication: Secondary | ICD-10-CM

## 2023-06-28 DIAGNOSIS — Z7189 Other specified counseling: Secondary | ICD-10-CM

## 2023-06-28 DIAGNOSIS — F331 Major depressive disorder, recurrent, moderate: Secondary | ICD-10-CM

## 2023-06-28 DIAGNOSIS — R972 Elevated prostate specific antigen [PSA]: Secondary | ICD-10-CM

## 2023-06-28 DIAGNOSIS — E66811 Obesity, class 1: Secondary | ICD-10-CM

## 2023-06-28 DIAGNOSIS — Z0000 Encounter for general adult medical examination without abnormal findings: Secondary | ICD-10-CM

## 2023-06-28 DIAGNOSIS — E785 Hyperlipidemia, unspecified: Secondary | ICD-10-CM

## 2023-06-28 DIAGNOSIS — I10 Essential (primary) hypertension: Secondary | ICD-10-CM

## 2023-06-28 DIAGNOSIS — E669 Obesity, unspecified: Secondary | ICD-10-CM

## 2023-06-28 DIAGNOSIS — I480 Paroxysmal atrial fibrillation: Secondary | ICD-10-CM

## 2023-06-28 DIAGNOSIS — K760 Fatty (change of) liver, not elsewhere classified: Secondary | ICD-10-CM

## 2023-06-28 LAB — COMPREHENSIVE METABOLIC PANEL
ALT: 28 U/L (ref 0–53)
AST: 22 U/L (ref 0–37)
Albumin: 4.2 g/dL (ref 3.5–5.2)
Alkaline Phosphatase: 80 U/L (ref 39–117)
BUN: 35 mg/dL — ABNORMAL HIGH (ref 6–23)
CO2: 29 mEq/L (ref 19–32)
Calcium: 9.5 mg/dL (ref 8.4–10.5)
Chloride: 103 mEq/L (ref 96–112)
Creatinine, Ser: 1.04 mg/dL (ref 0.40–1.50)
GFR: 72.64 mL/min (ref >60.00)
Glucose, Bld: 112 mg/dL — ABNORMAL HIGH (ref 70–99)
Potassium: 4.7 mEq/L (ref 3.5–5.1)
Sodium: 140 mEq/L (ref 135–145)
Total Bilirubin: 0.8 mg/dL (ref 0.2–1.2)
Total Protein: 6.4 g/dL (ref 6.0–8.3)

## 2023-06-28 LAB — CBC WITH DIFFERENTIAL/PLATELET
Basophils Absolute: 0 10*3/uL (ref 0.0–0.1)
Basophils Relative: 0.8 % (ref 0.0–3.0)
Eosinophils Absolute: 0.1 10*3/uL (ref 0.0–0.7)
Eosinophils Relative: 2.3 % (ref 0.0–5.0)
HCT: 45.1 % (ref 39.0–52.0)
Hemoglobin: 14.7 g/dL (ref 13.0–17.0)
Lymphocytes Relative: 30.6 % (ref 12.0–46.0)
Lymphs Abs: 1.7 10*3/uL (ref 0.7–4.0)
MCHC: 32.7 g/dL (ref 30.0–36.0)
MCV: 89.2 fl (ref 78.0–100.0)
Monocytes Absolute: 0.6 10*3/uL (ref 0.1–1.0)
Monocytes Relative: 10.2 % (ref 3.0–12.0)
Neutro Abs: 3.2 10*3/uL (ref 1.4–7.7)
Neutrophils Relative %: 56.1 % (ref 43.0–77.0)
Platelets: 229 10*3/uL (ref 150.0–400.0)
RBC: 5.06 Mil/uL (ref 4.22–5.81)
RDW: 14.5 % (ref 11.5–15.5)
WBC: 5.7 10*3/uL (ref 4.0–10.5)

## 2023-06-28 LAB — LIPID PANEL
Cholesterol: 127 mg/dL (ref 0–200)
HDL: 28.8 mg/dL — ABNORMAL LOW (ref >39.00)
LDL Cholesterol: 64 mg/dL (ref 0–99)
NonHDL: 98.27
Total CHOL/HDL Ratio: 4
Triglycerides: 171 mg/dL — ABNORMAL HIGH (ref 0.0–149.0)
VLDL: 34.2 mg/dL (ref 0.0–40.0)

## 2023-06-28 LAB — HEMOGLOBIN A1C: Hgb A1c MFr Bld: 6.3 % (ref 4.6–6.5)

## 2023-06-28 LAB — MICROALBUMIN / CREATININE URINE RATIO
Creatinine,U: 94.7 mg/dL
Microalb Creat Ratio: 0.7 mg/g (ref 0.0–30.0)
Microalb, Ur: <0.7 mg/dL (ref 0.0–1.9)

## 2023-06-28 LAB — TSH: TSH: 4.37 u[IU]/mL (ref 0.35–5.50)

## 2023-06-28 LAB — PSA: PSA: 2.22 ng/mL (ref 0.10–4.00)

## 2023-06-28 MED ORDER — ACCU-CHEK GUIDE VI STRP: ORAL_STRIP | 3 refills | Status: AC

## 2023-06-28 MED ORDER — SERTRALINE HCL 100 MG PO TABS: 100.0000 mg | ORAL_TABLET | Freq: Every day | ORAL | 4 refills | Status: DC

## 2023-06-28 MED ORDER — ATORVASTATIN CALCIUM 40 MG PO TABS: 40.0000 mg | ORAL_TABLET | Freq: Every day | ORAL | 4 refills | Status: DC

## 2023-06-28 MED ORDER — DILTIAZEM HCL ER COATED BEADS 120 MG PO CP24: ORAL_CAPSULE | ORAL | 4 refills | Status: DC

## 2023-06-28 MED ORDER — ACCU-CHEK SOFTCLIX LANCETS MISC: 3 refills | Status: AC

## 2023-06-28 MED ORDER — LOSARTAN POTASSIUM-HCTZ 50-12.5 MG PO TABS: 1.0000 | ORAL_TABLET | Freq: Every day | ORAL | 4 refills | Status: DC

## 2023-06-28 MED ORDER — APIXABAN 5 MG PO TABS: 5.0000 mg | ORAL_TABLET | Freq: Two times a day (BID) | ORAL | 4 refills | Status: DC

## 2023-06-28 MED ORDER — METOPROLOL SUCCINATE ER 50 MG PO TB24: 50.0000 mg | ORAL_TABLET | Freq: Every day | ORAL | 4 refills | Status: DC

## 2023-06-28 NOTE — Assessment & Plan Note (Signed)
Update PSA today.  

## 2023-06-28 NOTE — Assessment & Plan Note (Signed)
Update LFTs.      Fib-4 interpretation is not validated for people under 35 or over 70 years of age.
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
## 2023-06-28 NOTE — Assessment & Plan Note (Signed)

## 2023-06-28 NOTE — Assessment & Plan Note (Signed)
Stable period on sertraline 100mg  daily.  Also continues seeing counselor regularly.

## 2023-06-28 NOTE — Assessment & Plan Note (Addendum)
Notes intermittent afib on home monitor.  Continue current regimen including eliquis.  Rec against doubling up on toprol XL  Encourage cards f/u - will be due 08/2023

## 2023-06-28 NOTE — Assessment & Plan Note (Signed)
Advanced directive discussion: has living will at home, does want CPR but doesn't want prolonged life support if terminal condition. Would want son [name] to be HCPOA. Will bring us copy.  

## 2023-06-28 NOTE — Assessment & Plan Note (Addendum)
Congratulated on 20 lb weight loss over the past year!  He is motivated to continue healthy diet and lifestyle choices to affect sustainable weight loss.  Discussed recommended daily protein intake - 0.8gm/kg.

## 2023-06-28 NOTE — Assessment & Plan Note (Signed)
Chronic on atorva - continue. The 10-year ASCVD risk score (Arnett DK, et al., 2019) is: 32.7%   Values used to calculate the score:     Age: 72 years     Sex: Male     Is Non-Hispanic African American: No     Diabetic: Yes     Tobacco smoker: No     Systolic Blood Pressure: 124 mmHg     Is BP treated: Yes     HDL Cholesterol: 45.1 mg/dL     Total Cholesterol: 159 mg/dL

## 2023-06-28 NOTE — Assessment & Plan Note (Signed)
Chronic diet controlled. Anticipate significant improvement with recent weight loss. Update A1c.

## 2023-06-28 NOTE — Assessment & Plan Note (Deleted)
Advanced directive discussion: has living will at home, does want CPR but doesn't want prolonged life support if terminal condition. Would want son Jonathon Middleton to be HCPOA. Will bring us copy.  

## 2023-06-28 NOTE — Assessment & Plan Note (Signed)
Chronic, great control on current regimen - continue.

## 2023-06-28 NOTE — Patient Instructions (Addendum)
Labs today Congratulations on weight loss! If interested, check with pharmacy about new 2 shot shingles series (shingrix) and RSV.  Return in 6 months for diabetes follow up visit.

## 2023-07-03 ENCOUNTER — Encounter: Payer: Self-pay | Admitting: Family Medicine

## 2023-07-03 DIAGNOSIS — Z1211 Encounter for screening for malignant neoplasm of colon: Secondary | ICD-10-CM

## 2023-07-06 ENCOUNTER — Ambulatory Visit (INDEPENDENT_AMBULATORY_CARE_PROVIDER_SITE_OTHER): Payer: Self-pay | Admitting: Psychology

## 2023-07-06 DIAGNOSIS — F4323 Adjustment disorder with mixed anxiety and depressed mood: Secondary | ICD-10-CM

## 2023-07-06 NOTE — Progress Notes (Signed)
[facility] Counselor/Therapist Progress Note  Patient ID: Jonathon Middleton, MRN: [id number],    Date: 07/06/2023  Time Spent: 12:00pm-12:55pm   55 minutes   Treatment Type: Individual Therapy  Reported Symptoms: stress  Mental Status Exam: Appearance:  Casual     Behavior: Appropriate  Motor: Normal  Speech/Language:  Normal Rate  Affect: Appropriate  Mood: normal  Thought process: normal  Thought content:   WNL  Sensory/Perceptual disturbances:   WNL  Orientation: oriented to person, place, time/date, and situation  Attention: Good  Concentration: Good  Memory: WNL  Fund of knowledge:  Good  Insight:   Good  Judgment:  Good  Impulse Control: Good   Risk Assessment: Danger to Self:  No Self-injurious Behavior: No Danger to Others: No Duty to Warn:no Physical Aggression / Violence:No  Access to Firearms a concern: No  Gang Involvement:No   Subjective: Pt present for face-to-face individual therapy via video.  Pt consents to telehealth video session and is aware of limitations of virtual sessions. Location of pt: home Location of therapist: home office.   Pt talked about doing ok overall.   He has been journaling about issues with fear.  Pt has been nudging himself to push past his fears.   He has done this at work by speaking his mind with a colleagues who bothers him.  Addressed the interaction and how it and pt's response impacted pt.   Pt talked about his health.   He saw his PCP who was pleased with pt's weight loss.  Pt's blood work was good.   Pt is working on present moment mindfulness.  Worked on self care strategies. Provided supportive therapy.   Interventions: Cognitive Behavioral Therapy and Insight-Oriented  Diagnosis:  F43.23   Plan of Care: Recommend ongoing therapy.  Pt participated in setting treatment goals.  Plan to continue to meet every two weeks.   Treatment Plan Client Abilities/Strengths  Pt is bright, engaging, and  motivated for therapy.  Client Treatment Preferences  Individual therapy.  Client Statement of Needs  Improve copings skills and understand herself better. Improve self esteem.  Symptoms  Depressed or irritable mood. Excessive and/or unrealistic worry that is difficult to control occurring more days than not for at least 6 months about a number of events or activities. Hypervigilance (e.g., feeling constantly on edge, experiencing concentration difficulties, having trouble falling or staying asleep, exhibiting a general state of irritability). Low self-esteem. Problems Addressed  Unipolar Depression, Anxiety Goals 1. Alleviate depressive symptoms and return to previous level of effective functioning. 2. Appropriately grieve the loss in order to normalize mood and to return to previously adaptive level of functioning. Objective Learn and implement behavioral strategies to overcome depression. Target Date: 2024-04-26 Frequency: Biweekly  Progress: 65 Modality: individual  Related Interventions Assist the client in developing skills that increase the likelihood of deriving pleasure from behavioral activation (e.g., assertiveness skills, developing an exercise plan, less internal/more external focus, increased social involvement); reinforce success. Engage the client in "behavioral activation," increasing his/her activity level and contact with sources of reward, while identifying processes that inhibit activation. use behavioral techniques such as instruction, rehearsal, role-playing, role reversal, as needed, to facilitate activity in the client's daily life; reinforce success. 3. Develop healthy interpersonal relationships that lead to the alleviation and help prevent the relapse of depression. 4. Develop healthy thinking patterns and beliefs about self, others, and the world that lead to the alleviation and help prevent the relapse of depression. 5. Enhance ability to  effectively cope with the  full variety of life's worries and anxieties. 6. Learn and implement coping skills that result in a reduction of anxiety and worry, and improved daily functioning. Objective Learn and implement problem-solving strategies for realistically addressing worries. Target Date: 2024-04-26 Frequency: Biweekly  Progress: 65 Modality: individual  Related Interventions Assign the client a homework exercise in which he/she problem-solves a current problem (see Mastery of Your Anxiety and Worry: Workbook by Elenora Fender and Filbert Schilder or Generalized Anxiety Disorder by Elesa Hacker, and Filbert Schilder); review, reinforce success, and provide corrective feedback toward improvement. Teach the client problem-solving strategies involving specifically defining a problem, generating options for addressing it, evaluating the pros and cons of each option, selecting and implementing an optional action, and reevaluating and refining the action. Objective Learn and implement calming skills to reduce overall anxiety and manage anxiety symptoms. Target Date: 2024-04-26 Frequency: Biweekly  Progress: 65 Modality: individual  Related Interventions Assign the client to read about progressive muscle relaxation and other calming strategies in relevant books or treatment manuals (e.g., Progressive Relaxation Training by Twana First; Mastery of Your Anxiety and Worry: Workbook by Earlie Counts). Assign the client homework each session in which he/she practices relaxation exercises daily, gradually applying them progressively from non-anxiety-provoking to anxiety-provoking situations; review and reinforce success while providing corrective feedback toward improvement. Teach the client calming/relaxation skills (e.g., applied relaxation, progressive muscle relaxation, cue controlled relaxation; mindful breathing; biofeedback) and how to discriminate better between relaxation and tension; teach the client how to apply these skills to  his/her daily life. 7. Recognize, accept, and cope with feelings of depression. 8. Reduce overall frequency, intensity, and duration of the anxiety so that daily functioning is not impaired. 9. Resolve the core conflict that is the source of anxiety. 10. Stabilize anxiety level while increasing ability to function on a daily basis. Diagnosis F43.23  Conditions For Discharge Achievement of treatment goals and objectives   Salomon Fick, LCSW

## 2023-07-07 NOTE — Telephone Encounter (Signed)
iFOB will be mailed to pt.

## 2023-07-07 NOTE — Telephone Encounter (Signed)
Elevated BUN - please mail iFOB, ordered.

## 2023-07-20 ENCOUNTER — Ambulatory Visit (INDEPENDENT_AMBULATORY_CARE_PROVIDER_SITE_OTHER): Payer: Self-pay | Admitting: Psychology

## 2023-07-20 DIAGNOSIS — F4323 Adjustment disorder with mixed anxiety and depressed mood: Secondary | ICD-10-CM

## 2023-07-20 NOTE — Progress Notes (Signed)
[facility] Counselor/Therapist Progress Note  Patient ID: Johnlee Matsuyama, MRN: [id number],    Date: 07/20/2023  Time Spent: 12:00pm-12:55pm   55 minutes   Treatment Type: Individual Therapy  Reported Symptoms: stress  Mental Status Exam: Appearance:  Casual     Behavior: Appropriate  Motor: Normal  Speech/Language:  Normal Rate  Affect: Appropriate  Mood: normal  Thought process: normal  Thought content:   WNL  Sensory/Perceptual disturbances:   WNL  Orientation: oriented to person, place, time/date, and situation  Attention: Good  Concentration: Good  Memory: WNL  Fund of knowledge:  Good  Insight:   Good  Judgment:  Good  Impulse Control: Good   Risk Assessment: Danger to Self:  No Self-injurious Behavior: No Danger to Others: No Duty to Warn:no Physical Aggression / Violence:No  Access to Firearms a concern: No  Gang Involvement:No   Subjective: Pt present for face-to-face individual therapy via video.  Pt consents to telehealth video session and is aware of limitations of virtual sessions. Location of pt: home Location of therapist: home office.   Pt talked about  thinking about his issues with fear.  He has been listening to pod casts that included Dewain Penning.  Addressed what pt has learned and how it relates to him.  Pt states his fear is routed in insecurity.  Helped pt process the issues and his feelings.  Addressed times in pt's childhood when he felt fear and how it impacted him.   Addressed what current things trigger pt's fears. Worked on self care strategies. Provided supportive therapy.   Interventions: Cognitive Behavioral Therapy and Insight-Oriented  Diagnosis:  F43.23   Plan of Care: Recommend ongoing therapy.  Pt participated in setting treatment goals.  Plan to continue to meet every two weeks.   Treatment Plan Client Abilities/Strengths  Pt is bright, engaging, and motivated for therapy.  Client Treatment Preferences   Individual therapy.  Client Statement of Needs  Improve copings skills and understand herself better. Improve self esteem.  Symptoms  Depressed or irritable mood. Excessive and/or unrealistic worry that is difficult to control occurring more days than not for at least 6 months about a number of events or activities. Hypervigilance (e.g., feeling constantly on edge, experiencing concentration difficulties, having trouble falling or staying asleep, exhibiting a general state of irritability). Low self-esteem. Problems Addressed  Unipolar Depression, Anxiety Goals 1. Alleviate depressive symptoms and return to previous level of effective functioning. 2. Appropriately grieve the loss in order to normalize mood and to return to previously adaptive level of functioning. Objective Learn and implement behavioral strategies to overcome depression. Target Date: 2024-04-26 Frequency: Biweekly  Progress: 65 Modality: individual  Related Interventions Assist the client in developing skills that increase the likelihood of deriving pleasure from behavioral activation (e.g., assertiveness skills, developing an exercise plan, less internal/more external focus, increased social involvement); reinforce success. Engage the client in "behavioral activation," increasing his/her activity level and contact with sources of reward, while identifying processes that inhibit activation. use behavioral techniques such as instruction, rehearsal, role-playing, role reversal, as needed, to facilitate activity in the client's daily life; reinforce success. 3. Develop healthy interpersonal relationships that lead to the alleviation and help prevent the relapse of depression. 4. Develop healthy thinking patterns and beliefs about self, others, and the world that lead to the alleviation and help prevent the relapse of depression. 5. Enhance ability to effectively cope with the full variety of life's worries and anxieties. 6. Learn  and implement coping  skills that result in a reduction of anxiety and worry, and improved daily functioning. Objective Learn and implement problem-solving strategies for realistically addressing worries. Target Date: 2024-04-26 Frequency: Biweekly  Progress: 65 Modality: individual  Related Interventions Assign the client a homework exercise in which he/she problem-solves a current problem (see Mastery of Your Anxiety and Worry: Workbook by Elenora Fender and Filbert Schilder or Generalized Anxiety Disorder by Elesa Hacker, and Filbert Schilder); review, reinforce success, and provide corrective feedback toward improvement. Teach the client problem-solving strategies involving specifically defining a problem, generating options for addressing it, evaluating the pros and cons of each option, selecting and implementing an optional action, and reevaluating and refining the action. Objective Learn and implement calming skills to reduce overall anxiety and manage anxiety symptoms. Target Date: 2024-04-26 Frequency: Biweekly  Progress: 65 Modality: individual  Related Interventions Assign the client to read about progressive muscle relaxation and other calming strategies in relevant books or treatment manuals (e.g., Progressive Relaxation Training by Twana First; Mastery of Your Anxiety and Worry: Workbook by Earlie Counts). Assign the client homework each session in which he/she practices relaxation exercises daily, gradually applying them progressively from non-anxiety-provoking to anxiety-provoking situations; review and reinforce success while providing corrective feedback toward improvement. Teach the client calming/relaxation skills (e.g., applied relaxation, progressive muscle relaxation, cue controlled relaxation; mindful breathing; biofeedback) and how to discriminate better between relaxation and tension; teach the client how to apply these skills to his/her daily life. 7. Recognize, accept, and cope with  feelings of depression. 8. Reduce overall frequency, intensity, and duration of the anxiety so that daily functioning is not impaired. 9. Resolve the core conflict that is the source of anxiety. 10. Stabilize anxiety level while increasing ability to function on a daily basis. Diagnosis F43.23  Conditions For Discharge Achievement of treatment goals and objectives   Salomon Fick, LCSW

## 2023-08-03 ENCOUNTER — Ambulatory Visit (INDEPENDENT_AMBULATORY_CARE_PROVIDER_SITE_OTHER): Payer: Self-pay | Admitting: Psychology

## 2023-08-03 DIAGNOSIS — F4323 Adjustment disorder with mixed anxiety and depressed mood: Secondary | ICD-10-CM

## 2023-08-03 NOTE — Progress Notes (Signed)
[facility] Counselor/Therapist Progress Note  Patient ID: Achilleus Vanarsdall, MRN: [id number],    Date: 08/03/2023  Time Spent: 12:00pm-12:55pm   55 minutes   Treatment Type: Individual Therapy  Reported Symptoms: stress  Mental Status Exam: Appearance:  Casual     Behavior: Appropriate  Motor: Normal  Speech/Language:  Normal Rate  Affect: Appropriate  Mood: normal  Thought process: normal  Thought content:   WNL  Sensory/Perceptual disturbances:   WNL  Orientation: oriented to person, place, time/date, and situation  Attention: Good  Concentration: Good  Memory: WNL  Fund of knowledge:  Good  Insight:   Good  Judgment:  Good  Impulse Control: Good   Risk Assessment: Danger to Self:  No Self-injurious Behavior: No Danger to Others: No Duty to Warn:no Physical Aggression / Violence:No  Access to Firearms a concern: No  Gang Involvement:No   Subjective: Pt present for face-to-face individual therapy via video.  Pt consents to telehealth video session and is aware of limitations and benefits of virtual sessions. Location of pt: home Location of therapist: home office.   Pt talked about having a couple of setbacks at work that have been frustrating for him.   Addressed the setbacks and helped pt process his feelings and work issues.   Pt has been thinking about issues with fear.   Addressed how this impacts his life.  Worked on healthy ways to manage fear.  Worked on self care strategies. Provided supportive therapy.   Interventions: Cognitive Behavioral Therapy and Insight-Oriented  Diagnosis:  F43.23   Plan of Care: Recommend ongoing therapy.  Pt participated in setting treatment goals.  Plan to continue to meet every two weeks.   Treatment Plan Client Abilities/Strengths  Pt is bright, engaging, and motivated for therapy.  Client Treatment Preferences  Individual therapy.  Client Statement of Needs  Improve copings skills and understand herself  better. Improve self esteem.  Symptoms  Depressed or irritable mood. Excessive and/or unrealistic worry that is difficult to control occurring more days than not for at least 6 months about a number of events or activities. Hypervigilance (e.g., feeling constantly on edge, experiencing concentration difficulties, having trouble falling or staying asleep, exhibiting a general state of irritability). Low self-esteem. Problems Addressed  Unipolar Depression, Anxiety Goals 1. Alleviate depressive symptoms and return to previous level of effective functioning. 2. Appropriately grieve the loss in order to normalize mood and to return to previously adaptive level of functioning. Objective Learn and implement behavioral strategies to overcome depression. Target Date: 2024-04-26 Frequency: Biweekly  Progress: 65 Modality: individual  Related Interventions Assist the client in developing skills that increase the likelihood of deriving pleasure from behavioral activation (e.g., assertiveness skills, developing an exercise plan, less internal/more external focus, increased social involvement); reinforce success. Engage the client in "behavioral activation," increasing his/her activity level and contact with sources of reward, while identifying processes that inhibit activation. use behavioral techniques such as instruction, rehearsal, role-playing, role reversal, as needed, to facilitate activity in the client's daily life; reinforce success. 3. Develop healthy interpersonal relationships that lead to the alleviation and help prevent the relapse of depression. 4. Develop healthy thinking patterns and beliefs about self, others, and the world that lead to the alleviation and help prevent the relapse of depression. 5. Enhance ability to effectively cope with the full variety of life's worries and anxieties. 6. Learn and implement coping skills that result in a reduction of anxiety and worry, and improved daily  functioning. Objective Learn and  implement problem-solving strategies for realistically addressing worries. Target Date: 2024-04-26 Frequency: Biweekly  Progress: 65 Modality: individual  Related Interventions Assign the client a homework exercise in which he/she problem-solves a current problem (see Mastery of Your Anxiety and Worry: Workbook by Elenora Fender and Filbert Schilder or Generalized Anxiety Disorder by Elesa Hacker, and Filbert Schilder); review, reinforce success, and provide corrective feedback toward improvement. Teach the client problem-solving strategies involving specifically defining a problem, generating options for addressing it, evaluating the pros and cons of each option, selecting and implementing an optional action, and reevaluating and refining the action. Objective Learn and implement calming skills to reduce overall anxiety and manage anxiety symptoms. Target Date: 2024-04-26 Frequency: Biweekly  Progress: 65 Modality: individual  Related Interventions Assign the client to read about progressive muscle relaxation and other calming strategies in relevant books or treatment manuals (e.g., Progressive Relaxation Training by Twana First; Mastery of Your Anxiety and Worry: Workbook by Earlie Counts). Assign the client homework each session in which he/she practices relaxation exercises daily, gradually applying them progressively from non-anxiety-provoking to anxiety-provoking situations; review and reinforce success while providing corrective feedback toward improvement. Teach the client calming/relaxation skills (e.g., applied relaxation, progressive muscle relaxation, cue controlled relaxation; mindful breathing; biofeedback) and how to discriminate better between relaxation and tension; teach the client how to apply these skills to his/her daily life. 7. Recognize, accept, and cope with feelings of depression. 8. Reduce overall frequency, intensity, and duration of the anxiety so  that daily functioning is not impaired. 9. Resolve the core conflict that is the source of anxiety. 10. Stabilize anxiety level while increasing ability to function on a daily basis. Diagnosis F43.23  Conditions For Discharge Achievement of treatment goals and objectives   Salomon Fick, LCSW

## 2023-08-15 ENCOUNTER — Other Ambulatory Visit: Payer: Self-pay

## 2023-08-15 DIAGNOSIS — Z1211 Encounter for screening for malignant neoplasm of colon: Secondary | ICD-10-CM

## 2023-08-17 ENCOUNTER — Ambulatory Visit (INDEPENDENT_AMBULATORY_CARE_PROVIDER_SITE_OTHER): Payer: Self-pay | Admitting: Psychology

## 2023-08-17 DIAGNOSIS — F4323 Adjustment disorder with mixed anxiety and depressed mood: Secondary | ICD-10-CM

## 2023-08-17 LAB — FECAL OCCULT BLOOD, IMMUNOCHEMICAL: Fecal Occult Bld: NEGATIVE

## 2023-08-17 NOTE — Progress Notes (Signed)
[facility] Counselor/Therapist Progress Note  Patient ID: Jonathon Middleton, MRN: [id number],    Date: 08/17/2023  Time Spent: 12:00pm-12:55pm   55 minutes   Treatment Type: Individual Therapy  Reported Symptoms: stress  Mental Status Exam: Appearance:  Casual     Behavior: Appropriate  Motor: Normal  Speech/Language:  Normal Rate  Affect: Appropriate  Mood: normal  Thought process: normal  Thought content:   WNL  Sensory/Perceptual disturbances:   WNL  Orientation: oriented to person, place, time/date, and situation  Attention: Good  Concentration: Good  Memory: WNL  Fund of knowledge:  Good  Insight:   Good  Judgment:  Good  Impulse Control: Good   Risk Assessment: Danger to Self:  No Self-injurious Behavior: No Danger to Others: No Duty to Warn:no Physical Aggression / Violence:No  Access to Firearms a concern: No  Gang Involvement:No   Subjective: Pt present for face-to-face individual therapy via video.  Pt consents to telehealth video session and is aware of limitations and benefits of virtual sessions. Location of pt: home Location of therapist: home office.   Pt talked about work.   He has had some meetings at work that were frustrating.   Helped pt process his feelings and work issues.   Pt had an interaction with his daughter in law that upset him.   Addressed the interaction.  Pt feels angry and hurt.   Pt was lonely.    He did not appreciate how his daughter in law communicated at the last minute.  Worked with pt on how he can communicate his needs to his family.   Worked on self care strategies. Provided supportive therapy.   Interventions: Cognitive Behavioral Therapy and Insight-Oriented  Diagnosis:  F43.23   Plan of Care: Recommend ongoing therapy.  Pt participated in setting treatment goals.  Plan to continue to meet every two weeks.   Treatment Plan Client Abilities/Strengths  Pt is bright, engaging, and motivated for therapy.   Client Treatment Preferences  Individual therapy.  Client Statement of Needs  Improve copings skills and understand herself better. Improve self esteem.  Symptoms  Depressed or irritable mood. Excessive and/or unrealistic worry that is difficult to control occurring more days than not for at least 6 months about a number of events or activities. Hypervigilance (e.g., feeling constantly on edge, experiencing concentration difficulties, having trouble falling or staying asleep, exhibiting a general state of irritability). Low self-esteem. Problems Addressed  Unipolar Depression, Anxiety Goals 1. Alleviate depressive symptoms and return to previous level of effective functioning. 2. Appropriately grieve the loss in order to normalize mood and to return to previously adaptive level of functioning. Objective Learn and implement behavioral strategies to overcome depression. Target Date: 2024-04-26 Frequency: Biweekly  Progress: 65 Modality: individual  Related Interventions Assist the client in developing skills that increase the likelihood of deriving pleasure from behavioral activation (e.g., assertiveness skills, developing an exercise plan, less internal/more external focus, increased social involvement); reinforce success. Engage the client in "behavioral activation," increasing his/her activity level and contact with sources of reward, while identifying processes that inhibit activation. use behavioral techniques such as instruction, rehearsal, role-playing, role reversal, as needed, to facilitate activity in the client's daily life; reinforce success. 3. Develop healthy interpersonal relationships that lead to the alleviation and help prevent the relapse of depression. 4. Develop healthy thinking patterns and beliefs about self, others, and the world that lead to the alleviation and help prevent the relapse of depression. 5. Enhance ability to effectively cope  with the full variety of life's  worries and anxieties. 6. Learn and implement coping skills that result in a reduction of anxiety and worry, and improved daily functioning. Objective Learn and implement problem-solving strategies for realistically addressing worries. Target Date: 2024-04-26 Frequency: Biweekly  Progress: 65 Modality: individual  Related Interventions Assign the client a homework exercise in which he/she problem-solves a current problem (see Mastery of Your Anxiety and Worry: Workbook by Elenora Fender and Filbert Schilder or Generalized Anxiety Disorder by Elesa Hacker, and Filbert Schilder); review, reinforce success, and provide corrective feedback toward improvement. Teach the client problem-solving strategies involving specifically defining a problem, generating options for addressing it, evaluating the pros and cons of each option, selecting and implementing an optional action, and reevaluating and refining the action. Objective Learn and implement calming skills to reduce overall anxiety and manage anxiety symptoms. Target Date: 2024-04-26 Frequency: Biweekly  Progress: 65 Modality: individual  Related Interventions Assign the client to read about progressive muscle relaxation and other calming strategies in relevant books or treatment manuals (e.g., Progressive Relaxation Training by Twana First; Mastery of Your Anxiety and Worry: Workbook by Earlie Counts). Assign the client homework each session in which he/she practices relaxation exercises daily, gradually applying them progressively from non-anxiety-provoking to anxiety-provoking situations; review and reinforce success while providing corrective feedback toward improvement. Teach the client calming/relaxation skills (e.g., applied relaxation, progressive muscle relaxation, cue controlled relaxation; mindful breathing; biofeedback) and how to discriminate better between relaxation and tension; teach the client how to apply these skills to his/her daily life. 7.  Recognize, accept, and cope with feelings of depression. 8. Reduce overall frequency, intensity, and duration of the anxiety so that daily functioning is not impaired. 9. Resolve the core conflict that is the source of anxiety. 10. Stabilize anxiety level while increasing ability to function on a daily basis. Diagnosis F43.23  Conditions For Discharge Achievement of treatment goals and objectives   Salomon Fick, LCSW                  Teruo Stilley Smiley, LCSW

## 2023-08-31 ENCOUNTER — Ambulatory Visit (INDEPENDENT_AMBULATORY_CARE_PROVIDER_SITE_OTHER): Payer: Self-pay | Admitting: Psychology

## 2023-08-31 DIAGNOSIS — F4323 Adjustment disorder with mixed anxiety and depressed mood: Secondary | ICD-10-CM
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
## 2023-09-14 ENCOUNTER — Ambulatory Visit (INDEPENDENT_AMBULATORY_CARE_PROVIDER_SITE_OTHER): Payer: Self-pay | Admitting: Psychology

## 2023-09-14 DIAGNOSIS — F4323 Adjustment disorder with mixed anxiety and depressed mood: Secondary | ICD-10-CM

## 2023-09-14 NOTE — Progress Notes (Signed)
[facility] Counselor/Therapist Progress Note  Patient ID: Cypress Fanfan, MRN: [id number],    Date: 09/14/2023  Time Spent: 12:00pm-12:55pm   55 minutes   Treatment Type: Individual Therapy  Reported Symptoms: stress  Mental Status Exam: Appearance:  Casual     Behavior: Appropriate  Motor: Normal  Speech/Language:  Normal Rate  Affect: Appropriate  Mood: normal  Thought process: normal  Thought content:   WNL  Sensory/Perceptual disturbances:   WNL  Orientation: oriented to person, place, time/date, and situation  Attention: Good  Concentration: Good  Memory: WNL  Fund of knowledge:  Good  Insight:   Good  Judgment:  Good  Impulse Control: Good   Risk Assessment: Danger to Self:  No Self-injurious Behavior: No Danger to Others: No Duty to Warn:no Physical Aggression / Violence:No  Access to Firearms a concern: No  Gang Involvement:No   Subjective: Pt present for face-to-face individual therapy via video.  Pt consents to telehealth video session and is aware of limitations and benefits of virtual sessions. Location of pt: home Location of therapist: home office.   Pt talked about finding a gym he may want to join.  He is going to interview the trainers and check reviews before he commits.  Pt talked about his health.  He still periodically gets afib which is worrisome to him.  Pt talked about thinking about how he is impacted about what other people think about him.  Pt worries about what others think about him.  Addressed pt's worries about what people think about him.  Tied this into his issues with perfectionism. Worked on self care strategies. Provided supportive therapy.   Interventions: Cognitive Behavioral Therapy and Insight-Oriented  Diagnosis:  F43.23   Plan of Care: Recommend ongoing therapy.  Pt participated in setting treatment goals.  Plan to continue to meet every two weeks.   Treatment Plan Client Abilities/Strengths  Pt is  bright, engaging, and motivated for therapy.  Client Treatment Preferences  Individual therapy.  Client Statement of Needs  Improve copings skills and understand herself better. Improve self esteem.  Symptoms  Depressed or irritable mood. Excessive and/or unrealistic worry that is difficult to control occurring more days than not for at least 6 months about a number of events or activities. Hypervigilance (e.g., feeling constantly on edge, experiencing concentration difficulties, having trouble falling or staying asleep, exhibiting a general state of irritability). Low self-esteem. Problems Addressed  Unipolar Depression, Anxiety Goals 1. Alleviate depressive symptoms and return to previous level of effective functioning. 2. Appropriately grieve the loss in order to normalize mood and to return to previously adaptive level of functioning. Objective Learn and implement behavioral strategies to overcome depression. Target Date: 2024-04-26 Frequency: Biweekly  Progress: 65 Modality: individual  Related Interventions Assist the client in developing skills that increase the likelihood of deriving pleasure from behavioral activation (e.g., assertiveness skills, developing an exercise plan, less internal/more external focus, increased social involvement); reinforce success. Engage the client in "behavioral activation," increasing his/her activity level and contact with sources of reward, while identifying processes that inhibit activation. use behavioral techniques such as instruction, rehearsal, role-playing, role reversal, as needed, to facilitate activity in the client's daily life; reinforce success. 3. Develop healthy interpersonal relationships that lead to the alleviation and help prevent the relapse of depression. 4. Develop healthy thinking patterns and beliefs about self, others, and the world that lead to the alleviation and help prevent the relapse of depression. 5. Enhance ability to  effectively cope with the  full variety of life's worries and anxieties. 6. Learn and implement coping skills that result in a reduction of anxiety and worry, and improved daily functioning. Objective Learn and implement problem-solving strategies for realistically addressing worries. Target Date: 2024-04-26 Frequency: Biweekly  Progress: 65 Modality: individual  Related Interventions Assign the client a homework exercise in which he/she problem-solves a current problem (see Mastery of Your Anxiety and Worry: Workbook by Elenora Fender and Filbert Schilder or Generalized Anxiety Disorder by Elesa Hacker, and Filbert Schilder); review, reinforce success, and provide corrective feedback toward improvement. Teach the client problem-solving strategies involving specifically defining a problem, generating options for addressing it, evaluating the pros and cons of each option, selecting and implementing an optional action, and reevaluating and refining the action. Objective Learn and implement calming skills to reduce overall anxiety and manage anxiety symptoms. Target Date: 2024-04-26 Frequency: Biweekly  Progress: 65 Modality: individual  Related Interventions Assign the client to read about progressive muscle relaxation and other calming strategies in relevant books or treatment manuals (e.g., Progressive Relaxation Training by Twana First; Mastery of Your Anxiety and Worry: Workbook by Earlie Counts). Assign the client homework each session in which he/she practices relaxation exercises daily, gradually applying them progressively from non-anxiety-provoking to anxiety-provoking situations; review and reinforce success while providing corrective feedback toward improvement. Teach the client calming/relaxation skills (e.g., applied relaxation, progressive muscle relaxation, cue controlled relaxation; mindful breathing; biofeedback) and how to discriminate better between relaxation and tension; teach the client how  to apply these skills to his/her daily life. 7. Recognize, accept, and cope with feelings of depression. 8. Reduce overall frequency, intensity, and duration of the anxiety so that daily functioning is not impaired. 9. Resolve the core conflict that is the source of anxiety. 10. Stabilize anxiety level while increasing ability to function on a daily basis. Diagnosis F43.23  Conditions For Discharge Achievement of treatment goals and objectives   Salomon Fick, LCSW

## 2023-09-28 ENCOUNTER — Ambulatory Visit (INDEPENDENT_AMBULATORY_CARE_PROVIDER_SITE_OTHER): Payer: Self-pay | Admitting: Psychology

## 2023-09-28 DIAGNOSIS — F4323 Adjustment disorder with mixed anxiety and depressed mood: Secondary | ICD-10-CM
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
## 2023-10-08 ENCOUNTER — Encounter: Payer: Self-pay | Admitting: Family Medicine

## 2023-10-08 DIAGNOSIS — I10 Essential (primary) hypertension: Secondary | ICD-10-CM

## 2023-10-09 NOTE — Telephone Encounter (Signed)
Both diltiazem 120 mg and sertraline 100 mg prescriptions were sent on 06/28/23, 90 pills with 4 additional refills to [facility].

## 2023-10-12 ENCOUNTER — Ambulatory Visit (INDEPENDENT_AMBULATORY_CARE_PROVIDER_SITE_OTHER): Payer: Self-pay | Admitting: Psychology

## 2023-10-12 DIAGNOSIS — F4323 Adjustment disorder with mixed anxiety and depressed mood: Secondary | ICD-10-CM

## 2023-10-12 NOTE — Progress Notes (Signed)
[facility] Counselor/Therapist Progress Note  Patient ID: Zarin Knupp, MRN: [id number],    Date: 10/12/2023  Time Spent: 12:00pm-12:55pm   55 minutes   Treatment Type: Individual Therapy  Reported Symptoms: stress  Mental Status Exam: Appearance:  Casual     Behavior: Appropriate  Motor: Normal  Speech/Language:  Normal Rate  Affect: Appropriate  Mood: normal  Thought process: normal  Thought content:   WNL  Sensory/Perceptual disturbances:   WNL  Orientation: oriented to person, place, time/date, and situation  Attention: Good  Concentration: Good  Memory: WNL  Fund of knowledge:  Good  Insight:   Good  Judgment:  Good  Impulse Control: Good   Risk Assessment: Danger to Self:  No Self-injurious Behavior: No Danger to Others: No Duty to Warn:no Physical Aggression / Violence:No  Access to Firearms a concern: No  Gang Involvement:No   Subjective: Pt present for face-to-face individual therapy via video.  Pt consents to telehealth video session and is aware of limitations and benefits of virtual sessions. Location of pt: home Location of therapist: home office.   Pt talked about the elections.  He is disappointed with the presidential election results.  Helped pt process his thoughts and feelings.  Pt talked about his exploration into self acceptance.  Pt has thought about his insecurities and where they originated from.  Addressed the things pt is not accepting of in himself.  He feels like he is never good enough. Addressed how pt can work on self acceptance in the present moment to balance out his tendency to focus on future goals.   Worked on self care strategies. Provided supportive therapy.   Interventions: Cognitive Behavioral Therapy and Insight-Oriented  Diagnosis:  F43.23   Plan of Care: Recommend ongoing therapy.  Pt participated in setting treatment goals.  Plan to continue to meet every two weeks.   Treatment Plan Client  Abilities/Strengths  Pt is bright, engaging, and motivated for therapy.  Client Treatment Preferences  Individual therapy.  Client Statement of Needs  Improve copings skills and understand herself better. Improve self esteem.  Symptoms  Depressed or irritable mood. Excessive and/or unrealistic worry that is difficult to control occurring more days than not for at least 6 months about a number of events or activities. Hypervigilance (e.g., feeling constantly on edge, experiencing concentration difficulties, having trouble falling or staying asleep, exhibiting a general state of irritability). Low self-esteem. Problems Addressed  Unipolar Depression, Anxiety Goals 1. Alleviate depressive symptoms and return to previous level of effective functioning. 2. Appropriately grieve the loss in order to normalize mood and to return to previously adaptive level of functioning. Objective Learn and implement behavioral strategies to overcome depression. Target Date: 2024-04-26 Frequency: Biweekly  Progress: 65 Modality: individual  Related Interventions Assist the client in developing skills that increase the likelihood of deriving pleasure from behavioral activation (e.g., assertiveness skills, developing an exercise plan, less internal/more external focus, increased social involvement); reinforce success. Engage the client in "behavioral activation," increasing his/her activity level and contact with sources of reward, while identifying processes that inhibit activation. use behavioral techniques such as instruction, rehearsal, role-playing, role reversal, as needed, to facilitate activity in the client's daily life; reinforce success. 3. Develop healthy interpersonal relationships that lead to the alleviation and help prevent the relapse of depression. 4. Develop healthy thinking patterns and beliefs about self, others, and the world that lead to the alleviation and help prevent the relapse of  depression. 5. Enhance ability to effectively cope with  the full variety of life's worries and anxieties. 6. Learn and implement coping skills that result in a reduction of anxiety and worry, and improved daily functioning. Objective Learn and implement problem-solving strategies for realistically addressing worries. Target Date: 2024-04-26 Frequency: Biweekly  Progress: 65 Modality: individual  Related Interventions Assign the client a homework exercise in which he/she problem-solves a current problem (see Mastery of Your Anxiety and Worry: Workbook by Elenora Fender and Filbert Schilder or Generalized Anxiety Disorder by Elesa Hacker, and Filbert Schilder); review, reinforce success, and provide corrective feedback toward improvement. Teach the client problem-solving strategies involving specifically defining a problem, generating options for addressing it, evaluating the pros and cons of each option, selecting and implementing an optional action, and reevaluating and refining the action. Objective Learn and implement calming skills to reduce overall anxiety and manage anxiety symptoms. Target Date: 2024-04-26 Frequency: Biweekly  Progress: 65 Modality: individual  Related Interventions Assign the client to read about progressive muscle relaxation and other calming strategies in relevant books or treatment manuals (e.g., Progressive Relaxation Training by Twana First; Mastery of Your Anxiety and Worry: Workbook by Earlie Counts). Assign the client homework each session in which he/she practices relaxation exercises daily, gradually applying them progressively from non-anxiety-provoking to anxiety-provoking situations; review and reinforce success while providing corrective feedback toward improvement. Teach the client calming/relaxation skills (e.g., applied relaxation, progressive muscle relaxation, cue controlled relaxation; mindful breathing; biofeedback) and how to discriminate better between relaxation  and tension; teach the client how to apply these skills to his/her daily life. 7. Recognize, accept, and cope with feelings of depression. 8. Reduce overall frequency, intensity, and duration of the anxiety so that daily functioning is not impaired. 9. Resolve the core conflict that is the source of anxiety. 10. Stabilize anxiety level while increasing ability to function on a daily basis. Diagnosis F43.23  Conditions For Discharge Achievement of treatment goals and objectives   Salomon Fick, LCSW

## 2023-10-22 NOTE — Progress Notes (Signed)
Cardiology Office Note  Date:  10/23/2023   ID:  Statler Broas, DOB 10/07/1951, MRN [id number]  PCP:  Eustaquio Boyden, MD   Chief Complaint  Patient presents with   12 month follow up     Patient c/o a-fib spells more frequently. Medications reviewed by the patient verbally.     HPI:  Mr Lamanna is a 72 year old gentleman with past medical history of smoking  for 5 years who stopped in 1984, PAF, 2014, 2017 , 2019, Chads vasc score of 1.  who presents for follow-up of his paroxysmal atrial fibrillation.  Last seen by myself in clinic September 2023 In atrial fibrillation today, started overnight Sometimes goes 48 to 72 hours Mild sx  Amiodarone held 1 year ago for TSH 8.7 Without amiodarone TSH 4.3  Losing weight through diet changes and exercise  Blood pressure well-controlled at home  Tolerating Lipitor 40 daily  Lab work reviewed A1C 6.3 Total chol 127, LDL 64 TSH 4  Lab Results  Component Value Date   CHOL 127 06/28/2023   HDL 28.80 (L) 06/28/2023   LDLCALC 64 06/28/2023   TRIG 171.0 (H) 06/28/2023   EKG personally reviewed by myself on todays visit EKG Interpretation Date/Time:  Monday October 23 2023 08:47:07 EST Ventricular Rate:  100 PR Interval:    QRS Duration:  142 QT Interval:  394 QTC Calculation: 508 R Axis:   77  Text Interpretation: Atrial fibrillation with premature ventricular or aberrantly conducted complexes Right bundle branch block No previous ECGs available Confirmed by Julien Nordmann [phone]) on 10/23/2023 8:51:33 AM    Other past medical history reviewed Sept 2018 fell off ladder Broke humerus on the right rebroke his arm, "overdoing it"  surgery again   father had heart attack at age 38 and died. He was a long-time smoker. Mother lived to 52  PMH:   has a past medical history of Arthritis, Atrial fibrillation (HCC), Depression, Depression with anxiety, GERD (gastroesophageal reflux disease), History of chicken pox,  Humerus fracture, Hyperlipidemia, Hypertension, Nonalcoholic fatty liver disease, Pre-diabetes, and Prediabetes.  PSH:    Past Surgical History:  Procedure Laterality Date   COLONOSCOPY     HARDWARE REMOVAL Right 09/21/2017   Procedure: HARDWARE REMOVAL OF RIGHT HUMERUS;  Surgeon: Tarry Kos, MD;  Location: [facility] OR;  Service: Orthopedics;  Laterality: Right;   KNEE SURGERY Right 2009   x 2 (Dr. Ernest Pine)  Arthroscopy- torn MCL   ORIF HUMERUS FRACTURE Right 08/25/2017   Procedure: OPEN REDUCTION INTERNAL FIXATION (ORIF) RIGHT HUMERAL SHAFT FRACTURE;  Surgeon: Tarry Kos, MD;  Location: [facility] OR;  Service: Orthopedics;  Laterality: Right;   ORIF HUMERUS FRACTURE Right 09/21/2017   Procedure: OPEN REDUCTION INTERNAL FIXATION (ORIF) RIGHT HUMERAL SHAFT FRACTURE;  Surgeon: Tarry Kos, MD;  Location: [facility] OR;  Service: Orthopedics;  Laterality: Right;    Current Outpatient Medications  Medication Sig Dispense Refill   Accu-Chek Softclix Lancets lancets Use as instructed to check blood sugar once daily 100 each 3   apixaban (ELIQUIS) 5 MG TABS tablet Take 1 tablet (5 mg total) by mouth 2 (two) times daily. 180 tablet 4   atorvastatin (LIPITOR) 40 MG tablet Take 1 tablet (40 mg total) by mouth daily. 90 tablet 4   Blood Glucose Monitoring Suppl (ACCU-CHEK GUIDE ME) w/Device KIT Use as instructed to check blood sugar once daily 1 kit 0   Cholecalciferol (VITAMIN D) 2000 units CAPS Take 2,000 Units by mouth 2 (two) times daily.  Coenzyme Q10 (CO Q-10) 100 MG CAPS Take 100 mg by mouth daily with breakfast.      diltiazem (CARDIZEM CD) 120 MG 24 hr capsule TAKE 1 CAPSULE BY MOUTH EVERY DAY 90 capsule 4   glucose blood (ACCU-CHEK GUIDE) test strip Use as instructed to check blood sugar once daily 100 each 3   losartan-hydrochlorothiazide (HYZAAR) 50-12.5 MG tablet Take 1 tablet by mouth daily. 90 tablet 4   metoprolol succinate (TOPROL-XL) 50 MG 24 hr tablet Take 1 tablet (50 mg total) by mouth  daily. Take with or immediately following a meal. 90 tablet 4   omega-3 fish oil (MAXEPA) 1000 MG CAPS capsule Take 1 capsule by mouth 2 (two) times daily.      sertraline (ZOLOFT) 100 MG tablet Take 1 tablet (100 mg total) by mouth daily. 90 tablet 4   TURMERIC PO Take by mouth in the morning and at bedtime.     No current facility-administered medications for this visit.    Allergies:   Patient has no known allergies.   Social History:  The patient  reports that he has quit smoking. His smoking use included cigars and cigarettes. He has a 5 pack-year smoking history. He has never used smokeless tobacco. He reports current alcohol use of about 2.0 standard drinks of alcohol per week. He reports that he does not use drugs.   Family History:   family history includes CAD (age of onset: 35) in his father; CAD (age of onset: 55) in his paternal uncle; Cancer in his maternal uncle; Diabetes in his paternal grandmother; Hypertension in his mother; Stroke in his paternal aunt.    Review of Systems: Review of Systems  Constitutional: Negative.   HENT: Negative.    Respiratory: Negative.    Cardiovascular: Negative.   Gastrointestinal: Negative.   Musculoskeletal: Negative.   Neurological: Negative.   Psychiatric/Behavioral: Negative.    All other systems reviewed and are negative.  PHYSICAL EXAM: VS:  BP 100/66 (BP Location: Left Arm, Patient Position: Sitting, Cuff Size: Normal)   Pulse 100   Ht 5\' 9"  (1.753 m)   Wt 210 lb 2 oz (95.3 kg)   SpO2 98%   BMI 31.03 kg/m  , BMI Body mass index is 31.03 kg/m. Constitutional:  oriented to person, place, and time. No distress.  HENT:  Head: Grossly normal Eyes:  no discharge. No scleral icterus.  Neck: No JVD, no carotid bruits  Cardiovascular: Irregularly irregular, no murmurs appreciated Pulmonary/Chest: Clear to auscultation bilaterally, no wheezes or rails Abdominal: Soft.  no distension.  no tenderness.  Musculoskeletal: Normal  range of motion Neurological:  normal muscle tone. Coordination normal. No atrophy Skin: Skin warm and dry Psychiatric: normal affect, pleasant  Recent Labs: 06/28/2023: ALT 28; BUN 35; Creatinine, Ser 1.04; Hemoglobin 14.7; Platelets 229.0; Potassium 4.7; Sodium 140; TSH 4.37    Lipid Panel Lab Results  Component Value Date   CHOL 127 06/28/2023   HDL 28.80 (L) 06/28/2023   LDLCALC 64 06/28/2023   TRIG 171.0 (H) 06/28/2023    Wt Readings from Last 3 Encounters:  10/23/23 210 lb 2 oz (95.3 kg)  06/28/23 208 lb 4 oz (94.5 kg)  12/28/22 227 lb 6 oz (103.1 kg)     ASSESSMENT AND PLAN:  Paroxysmal atrial fibrillation (HCC) - Plan: EKG 12-Lead In atrial fibrillation today, having episodes that will last 24 to 48 hours Recommend he increase diltiazem ER up to 120 twice daily, continue metoprolol succinate 50 daily, hold losartan  HCT Monitor blood pressure Flecainide 100 mg twice daily as needed for A-fib spells  Hyperlipidemia Cholesterol is at goal on the current lipid regimen. No changes to the medications were made.  Essential hypertension Blood pressure low on today's visit in the setting of A-fib and weight loss Commend he hold losartan HCTZ, increase diltiazem 120 twice daily as above stay on metoprolol Monitor pressures at home If blood pressure allows, will restart low-dose ARB given history of diabetes  Smoking history Stopped smoking many years ago  Controlled type 2 diabetes mellitus without complication, without long-term current use of insulin (HCC) Losing weight through exercise and dietary changes, down 17 pounds  Preventive care CT coronary calcium scoring ordered Cholesterol at goal, A1c improved, non-smoker   Orders Placed This Encounter  Procedures   EKG 12-Lead     Signed, Dossie Arbour, M.D., Ph.D. 10/23/2023  [facility] [place], [place] [phone]

## 2023-10-23 ENCOUNTER — Ambulatory Visit: Payer: Self-pay | Attending: Cardiovascular Disease | Admitting: Cardiovascular Disease

## 2023-10-23 ENCOUNTER — Encounter: Payer: Self-pay | Admitting: Cardiovascular Disease

## 2023-10-23 VITALS — BP 100/66 | HR 100 | Ht 69.0 in | Wt 210.1 lb

## 2023-10-23 DIAGNOSIS — E1169 Type 2 diabetes mellitus with other specified complication: Secondary | ICD-10-CM

## 2023-10-23 DIAGNOSIS — E785 Hyperlipidemia, unspecified: Secondary | ICD-10-CM

## 2023-10-23 DIAGNOSIS — I480 Paroxysmal atrial fibrillation: Secondary | ICD-10-CM

## 2023-10-23 DIAGNOSIS — I10 Essential (primary) hypertension: Secondary | ICD-10-CM

## 2023-10-23 DIAGNOSIS — K760 Fatty (change of) liver, not elsewhere classified: Secondary | ICD-10-CM

## 2023-10-23 MED ORDER — DILTIAZEM HCL ER COATED BEADS 120 MG PO CP24: 120.0000 mg | ORAL_CAPSULE | Freq: Two times a day (BID) | ORAL | 3 refills | Status: DC

## 2023-10-23 MED ORDER — FLECAINIDE ACETATE 100 MG PO TABS: 100.0000 mg | ORAL_TABLET | Freq: Two times a day (BID) | ORAL | 3 refills | Status: DC | PRN

## 2023-10-23 NOTE — Patient Instructions (Addendum)
Medication Instructions:  Flecainide 100 mg twice a day as needed for afib  Increase the diltiazem to 120 mg twice a day  For now, hold the losartan HCT   If you need a refill on your cardiac medications before your next appointment, please call your pharmacy.   Lab work: No new labs needed  Testing/Procedures: CT coronary calcium score.   - $99 out of pocket cost at the time of your test - Call [phone] to schedule at your convenience.  [facility] [address] [place], [place] [address]   Coronary CalciumScan A coronary calcium scan is an imaging test used to look for deposits of calcium and other fatty materials (plaques) in the inner lining of the blood vessels of the heart (coronary arteries). These deposits of calcium and plaques can partly clog and narrow the coronary arteries without producing any symptoms or warning signs. This puts a person at risk for a heart attack. This test can detect these deposits before symptoms develop. Tell a health care provider about: Any allergies you have. All medicines you are taking, including vitamins, herbs, eye drops, creams, and over-the-counter medicines. Any problems you or family members have had with anesthetic medicines. Any blood disorders you have. Any surgeries you have had. Any medical conditions you have. Whether you are pregnant or may be pregnant. What are the risks? Generally, this is a safe procedure. However, problems may occur, including: Harm to a pregnant woman and her unborn baby. This test involves the use of radiation. Radiation exposure can be dangerous to a pregnant woman and her unborn baby. If you are pregnant, you generally should not have this procedure done. Slight increase in the risk of cancer. This is because of the radiation involved in the test. What happens before the procedure? No preparation is needed for this procedure. What happens during the  procedure? You will undress and remove any jewelry around your neck or chest. You will put on a hospital gown. Sticky electrodes will be placed on your chest. The electrodes will be connected to an electrocardiogram (ECG) machine to record a tracing of the electrical activity of your heart. A CT scanner will take pictures of your heart. During this time, you will be asked to lie still and hold your breath for 2-3 seconds while a picture of your heart is being taken. The procedure may vary among health care providers and hospitals. What happens after the procedure? You can get dressed. You can return to your normal activities. It is up to you to get the results of your test. Ask your health care provider, or the department that is doing the test, when your results will be ready. Summary A coronary calcium scan is an imaging test used to look for deposits of calcium and other fatty materials (plaques) in the inner lining of the blood vessels of the heart (coronary arteries). Generally, this is a safe procedure. Tell your health care provider if you are pregnant or may be pregnant. No preparation is needed for this procedure. A CT scanner will take pictures of your heart. You can return to your normal activities after the scan is done. This information is not intended to replace advice given to you by your health care provider. Make sure you discuss any questions you have with your health care provider. Document Released: 05/19/2008 Document Revised: 10/10/2016 Document Reviewed: 10/10/2016 Elsevier Interactive Patient Education  2017 [employer].   Follow-Up: At [facility], you  and your health needs are our priority.  As part of our continuing mission to provide you with exceptional heart care, we have created designated Provider Care Teams.  These Care Teams include your primary Cardiologist (physician) and Advanced Practice Providers (APPs -  Physician Assistants and Nurse Practitioners)  who all work together to provide you with the care you need, when you need it.  You will need a follow up appointment in 12 months  Providers on your designated Care Team:   Nicolasa Ducking, NP Eula Listen, PA-C Cadence Fransico Michael, [place]  COVID-19 Vaccine Information can be found at: [email or web address] For questions related to vaccine distribution or appointments, please email vaccine@ [email or web address] or call [phone].

## 2023-10-26 ENCOUNTER — Ambulatory Visit
Admission: RE | Admit: 2023-10-26 | Discharge: 2023-10-26 | Disposition: A | Discharge status: Home / Self Care | Payer: PRIVATE HEALTH INSURANCE | Source: Ambulatory Visit | Attending: Cardiovascular Disease | Admitting: Cardiovascular Disease

## 2023-10-26 ENCOUNTER — Ambulatory Visit (INDEPENDENT_AMBULATORY_CARE_PROVIDER_SITE_OTHER): Payer: Self-pay | Admitting: Psychology

## 2023-10-26 DIAGNOSIS — F4323 Adjustment disorder with mixed anxiety and depressed mood: Secondary | ICD-10-CM

## 2023-10-26 DIAGNOSIS — E785 Hyperlipidemia, unspecified: Secondary | ICD-10-CM | POA: Insufficient documentation

## 2023-10-26 DIAGNOSIS — E1169 Type 2 diabetes mellitus with other specified complication: Secondary | ICD-10-CM | POA: Insufficient documentation

## 2023-10-26 NOTE — Progress Notes (Signed)
[facility] Counselor/Therapist Progress Note  Patient ID: Tres Parlette, MRN: [id number],    Date: 10/26/2023  Time Spent: 12:00pm-12:55pm   55 minutes   Treatment Type: Individual Therapy  Reported Symptoms: stress  Mental Status Exam: Appearance:  Casual     Behavior: Appropriate  Motor: Normal  Speech/Language:  Normal Rate  Affect: Appropriate  Mood: normal  Thought process: normal  Thought content:   WNL  Sensory/Perceptual disturbances:   WNL  Orientation: oriented to person, place, time/date, and situation  Attention: Good  Concentration: Good  Memory: WNL  Fund of knowledge:  Good  Insight:   Good  Judgment:  Good  Impulse Control: Good   Risk Assessment: Danger to Self:  No Self-injurious Behavior: No Danger to Others: No Duty to Warn:no Physical Aggression / Violence:No  Access to Firearms a concern: No  Gang Involvement:No   Subjective: Pt present for face-to-face individual therapy via video.  Pt consents to telehealth video session and is aware of limitations and benefits of virtual sessions. Location of pt: home Location of therapist: home office.   Pt talked about having a biopsy done on his tongue yesterday.   The doctor is not worried about it being cancer but want to be cautious.   Pt is in pain today.   Pt saw his cardiologist last week and had a CT of his heart today.    Pt talked about seeing his grand daughter's play at school last week.   Pt's son showed pt gratitude and love for attending.  This meant a lot to pt. Pt talked about work.  There have been frustrations with clients not working out after pt spends a lot of time on them.  Addressed the work issues.  Worked on [employer].  Addressed how pt can work on self acceptance in the present moment.   Pt is struggling with accepting himself as he is without judgement.   Identified that pt's self critical thoughts get in the way of self acceptance.   Worked on thought  reframing.   Pt talked about Thanksgiving.  He will travel to be with his friend for the holiday.   Pt talked about his friend Roe Coombs who is not doing well bc his wife Kathie Rhodes is having health issues.   Roe Coombs is doing caretaking and is feeling stress.  He also does caregiving for his parents.  Pt is worried about Roe Coombs and is keeping in touch with him to provide support.  Worked on self care strategies. Provided supportive therapy.   Interventions: Cognitive Behavioral Therapy and Insight-Oriented  Diagnosis:  F43.23   Plan of Care: Recommend ongoing therapy.  Pt participated in setting treatment goals.  Plan to continue to meet every two weeks.   Treatment Plan Client Abilities/Strengths  Pt is bright, engaging, and motivated for therapy.  Client Treatment Preferences  Individual therapy.  Client Statement of Needs  Improve copings skills and understand herself better. Improve self esteem.  Symptoms  Depressed or irritable mood. Excessive and/or unrealistic worry that is difficult to control occurring more days than not for at least 6 months about a number of events or activities. Hypervigilance (e.g., feeling constantly on edge, experiencing concentration difficulties, having trouble falling or staying asleep, exhibiting a general state of irritability). Low self-esteem. Problems Addressed  Unipolar Depression, Anxiety Goals 1. Alleviate depressive symptoms and return to previous level of effective functioning. 2. Appropriately grieve the loss in order to normalize mood and to return to previously adaptive level  of functioning. Objective Learn and implement behavioral strategies to overcome depression. Target Date: 2024-04-26 Frequency: Biweekly  Progress: 65 Modality: individual  Related Interventions Assist the client in developing skills that increase the likelihood of deriving pleasure from behavioral activation (e.g., assertiveness skills, developing an exercise plan, less internal/more  external focus, increased social involvement); reinforce success. Engage the client in "behavioral activation," increasing his/her activity level and contact with sources of reward, while identifying processes that inhibit activation. use behavioral techniques such as instruction, rehearsal, role-playing, role reversal, as needed, to facilitate activity in the client's daily life; reinforce success. 3. Develop healthy interpersonal relationships that lead to the alleviation and help prevent the relapse of depression. 4. Develop healthy thinking patterns and beliefs about self, others, and the world that lead to the alleviation and help prevent the relapse of depression. 5. Enhance ability to effectively cope with the full variety of life's worries and anxieties. 6. Learn and implement coping skills that result in a reduction of anxiety and worry, and improved daily functioning. Objective Learn and implement problem-solving strategies for realistically addressing worries. Target Date: 2024-04-26 Frequency: Biweekly  Progress: 65 Modality: individual  Related Interventions Assign the client a homework exercise in which he/she problem-solves a current problem (see Mastery of Your Anxiety and Worry: Workbook by Elenora Fender and Filbert Schilder or Generalized Anxiety Disorder by Elesa Hacker, and Filbert Schilder); review, reinforce success, and provide corrective feedback toward improvement. Teach the client problem-solving strategies involving specifically defining a problem, generating options for addressing it, evaluating the pros and cons of each option, selecting and implementing an optional action, and reevaluating and refining the action. Objective Learn and implement calming skills to reduce overall anxiety and manage anxiety symptoms. Target Date: 2024-04-26 Frequency: Biweekly  Progress: 65 Modality: individual  Related Interventions Assign the client to read about progressive muscle relaxation and other calming  strategies in relevant books or treatment manuals (e.g., Progressive Relaxation Training by Twana First; Mastery of Your Anxiety and Worry: Workbook by Earlie Counts). Assign the client homework each session in which he/she practices relaxation exercises daily, gradually applying them progressively from non-anxiety-provoking to anxiety-provoking situations; review and reinforce success while providing corrective feedback toward improvement. Teach the client calming/relaxation skills (e.g., applied relaxation, progressive muscle relaxation, cue controlled relaxation; mindful breathing; biofeedback) and how to discriminate better between relaxation and tension; teach the client how to apply these skills to his/her daily life. 7. Recognize, accept, and cope with feelings of depression. 8. Reduce overall frequency, intensity, and duration of the anxiety so that daily functioning is not impaired. 9. Resolve the core conflict that is the source of anxiety. 10. Stabilize anxiety level while increasing ability to function on a daily basis. Diagnosis F43.23  Conditions For Discharge Achievement of treatment goals and objectives   Salomon Fick, LCSW

## 2023-11-09 ENCOUNTER — Ambulatory Visit (INDEPENDENT_AMBULATORY_CARE_PROVIDER_SITE_OTHER): Payer: Self-pay | Admitting: Psychology

## 2023-11-09 DIAGNOSIS — F4323 Adjustment disorder with mixed anxiety and depressed mood: Secondary | ICD-10-CM

## 2023-11-09 NOTE — Progress Notes (Signed)
[facility] Counselor/Therapist Progress Note  Patient ID: Camerin Mclaney, MRN: [id number],    Date: 11/09/2023  Time Spent: 12:00pm-12:55pm   55 minutes   Treatment Type: Individual Therapy  Reported Symptoms: stress  Mental Status Exam: Appearance:  Casual     Behavior: Appropriate  Motor: Normal  Speech/Language:  Normal Rate  Affect: Appropriate  Mood: normal  Thought process: normal  Thought content:   WNL  Sensory/Perceptual disturbances:   WNL  Orientation: oriented to person, place, time/date, and situation  Attention: Good  Concentration: Good  Memory: WNL  Fund of knowledge:  Good  Insight:   Good  Judgment:  Good  Impulse Control: Good   Risk Assessment: Danger to Self:  No Self-injurious Behavior: No Danger to Others: No Duty to Warn:no Physical Aggression / Violence:No  Access to Firearms a concern: No  Gang Involvement:No   Subjective: Pt present for face-to-face individual therapy via video.  Pt consents to telehealth video session and is aware of limitations and benefits of virtual sessions. Location of pt: home Location of therapist: home office.   Pt talked about Thanksgiving.  He traveled to be with his friend [name].  He had a nice time with him and his family.  Pt is concerned about [name] bc he is smoking again.  [name] had a stroke that he had to rehab from a couple of years ago.   Pt talked about work.  There have been frustrations with clients not working out after pt spends a lot of time on them.  Addressed the work issues.  Worked on [employer], [employer].  Pt's ex girlfriend has started to work in [employer] this month.  Addressed how this will impact pt.  He won't have to interface with her very much.   Pt talked about his youngest son.  He recently got laid off.  Addressed pt's concerns about his son.   Addressed how pt can continue to work on self acceptance in the present moment.  Worked on thought reframing.   Worked on self care  strategies. Provided supportive therapy.   Interventions: Cognitive Behavioral Therapy and Insight-Oriented  Diagnosis:  F43.23   Plan of Care: Recommend ongoing therapy.  Pt participated in setting treatment goals.  Plan to continue to meet every two weeks.   Treatment Plan Client Abilities/Strengths  Pt is bright, engaging, and motivated for therapy.  Client Treatment Preferences  Individual therapy.  Client Statement of Needs  Improve copings skills and understand herself better. Improve self esteem.  Symptoms  Depressed or irritable mood. Excessive and/or unrealistic worry that is difficult to control occurring more days than not for at least 6 months about a number of events or activities. Hypervigilance (e.g., feeling constantly on edge, experiencing concentration difficulties, having trouble falling or staying asleep, exhibiting a general state of irritability). Low self-esteem. Problems Addressed  Unipolar Depression, Anxiety Goals 1. Alleviate depressive symptoms and return to previous level of effective functioning. 2. Appropriately grieve the loss in order to normalize mood and to return to previously adaptive level of functioning. Objective Learn and implement behavioral strategies to overcome depression. Target Date: 2024-04-26 Frequency: Biweekly  Progress: 65 Modality: individual  Related Interventions Assist the client in developing skills that increase the likelihood of deriving pleasure from behavioral activation (e.g., assertiveness skills, developing an exercise plan, less internal/more external focus, increased social involvement); reinforce success. Engage the client in "behavioral activation," increasing his/her activity level and contact with sources of reward, while identifying processes that inhibit  activation. use behavioral techniques such as instruction, rehearsal, role-playing, role reversal, as needed, to facilitate activity in the client's daily life;  reinforce success. 3. Develop healthy interpersonal relationships that lead to the alleviation and help prevent the relapse of depression. 4. Develop healthy thinking patterns and beliefs about self, others, and the world that lead to the alleviation and help prevent the relapse of depression. 5. Enhance ability to effectively cope with the full variety of life's worries and anxieties. 6. Learn and implement coping skills that result in a reduction of anxiety and worry, and improved daily functioning. Objective Learn and implement problem-solving strategies for realistically addressing worries. Target Date: 2024-04-26 Frequency: Biweekly  Progress: 65 Modality: individual  Related Interventions Assign the client a homework exercise in which he/she problem-solves a current problem (see Mastery of Your Anxiety and Worry: Workbook by Elenora Fender and Filbert Schilder or Generalized Anxiety Disorder by Elesa Hacker, and Filbert Schilder); review, reinforce success, and provide corrective feedback toward improvement. Teach the client problem-solving strategies involving specifically defining a problem, generating options for addressing it, evaluating the pros and cons of each option, selecting and implementing an optional action, and reevaluating and refining the action. Objective Learn and implement calming skills to reduce overall anxiety and manage anxiety symptoms. Target Date: 2024-04-26 Frequency: Biweekly  Progress: 65 Modality: individual  Related Interventions Assign the client to read about progressive muscle relaxation and other calming strategies in relevant books or treatment manuals (e.g., Progressive Relaxation Training by Twana First; Mastery of Your Anxiety and Worry: Workbook by Earlie Counts). Assign the client homework each session in which he/she practices relaxation exercises daily, gradually applying them progressively from non-anxiety-provoking to anxiety-provoking situations; review and  reinforce success while providing corrective feedback toward improvement. Teach the client calming/relaxation skills (e.g., applied relaxation, progressive muscle relaxation, cue controlled relaxation; mindful breathing; biofeedback) and how to discriminate better between relaxation and tension; teach the client how to apply these skills to his/her daily life. 7. Recognize, accept, and cope with feelings of depression. 8. Reduce overall frequency, intensity, and duration of the anxiety so that daily functioning is not impaired. 9. Resolve the core conflict that is the source of anxiety. 10. Stabilize anxiety level while increasing ability to function on a daily basis. Diagnosis F43.23  Conditions For Discharge Achievement of treatment goals and objectives   Salomon Fick, LCSW

## 2023-11-23 ENCOUNTER — Ambulatory Visit: Payer: Self-pay | Admitting: Psychology

## 2023-11-23 DIAGNOSIS — F4323 Adjustment disorder with mixed anxiety and depressed mood: Secondary | ICD-10-CM

## 2023-11-23 NOTE — Progress Notes (Signed)
[facility] Counselor/Therapist Progress Note  Patient ID: Duvid Favret, MRN: [id number],    Date: 11/23/2023  Time Spent: 12:00pm-12:55pm   55 minutes   Treatment Type: Individual Therapy  Reported Symptoms: stress  Mental Status Exam: Appearance:  Casual     Behavior: Appropriate  Motor: Normal  Speech/Language:  Normal Rate  Affect: Appropriate  Mood: normal  Thought process: normal  Thought content:   WNL  Sensory/Perceptual disturbances:   WNL  Orientation: oriented to person, place, time/date, and situation  Attention: Good  Concentration: Good  Memory: WNL  Fund of knowledge:  Good  Insight:   Good  Judgment:  Good  Impulse Control: Good   Risk Assessment: Danger to Self:  No Self-injurious Behavior: No Danger to Others: No Duty to Warn:no Physical Aggression / Violence:No  Access to Firearms a concern: No  Gang Involvement:No   Subjective: Pt present for face-to-face individual therapy via video.  Pt consents to telehealth video session and is aware of limitations and benefits of virtual sessions. Location of pt: home Location of therapist: home office.   Pt talked about having a tough week.  Work has been stressful.   Addressed the work issues.   Helped pt process his feelings and work dynamics.   Pt feels like he is not making progress at work but is working very hard.   Pt talked about his ex-girlfriend Lanora Manis being hired by his company.  Pt has talked with her and the phone call went ok.  Pt is being very professional.  Pt is having to work with her at times which is difficult for him.   Addressed how pt can continue to work on self acceptance in the present moment.  Worked on thought reframing.   Worked on self care strategies. Provided supportive therapy.   Interventions: Cognitive Behavioral Therapy and Insight-Oriented  Diagnosis:  F43.23   Plan of Care: Recommend ongoing therapy.  Pt participated in setting treatment goals.   Plan to continue to meet every two weeks.   Treatment Plan Client Abilities/Strengths  Pt is bright, engaging, and motivated for therapy.  Client Treatment Preferences  Individual therapy.  Client Statement of Needs  Improve copings skills and understand herself better. Improve self esteem.  Symptoms  Depressed or irritable mood. Excessive and/or unrealistic worry that is difficult to control occurring more days than not for at least 6 months about a number of events or activities. Hypervigilance (e.g., feeling constantly on edge, experiencing concentration difficulties, having trouble falling or staying asleep, exhibiting a general state of irritability). Low self-esteem. Problems Addressed  Unipolar Depression, Anxiety Goals 1. Alleviate depressive symptoms and return to previous level of effective functioning. 2. Appropriately grieve the loss in order to normalize mood and to return to previously adaptive level of functioning. Objective Learn and implement behavioral strategies to overcome depression. Target Date: 2024-04-26 Frequency: Biweekly  Progress: 65 Modality: individual  Related Interventions Assist the client in developing skills that increase the likelihood of deriving pleasure from behavioral activation (e.g., assertiveness skills, developing an exercise plan, less internal/more external focus, increased social involvement); reinforce success. Engage the client in "behavioral activation," increasing his/her activity level and contact with sources of reward, while identifying processes that inhibit activation. use behavioral techniques such as instruction, rehearsal, role-playing, role reversal, as needed, to facilitate activity in the client's daily life; reinforce success. 3. Develop healthy interpersonal relationships that lead to the alleviation and help prevent the relapse of depression. 4. Develop healthy thinking patterns and  beliefs about self, others, and the world that  lead to the alleviation and help prevent the relapse of depression. 5. Enhance ability to effectively cope with the full variety of life's worries and anxieties. 6. Learn and implement coping skills that result in a reduction of anxiety and worry, and improved daily functioning. Objective Learn and implement problem-solving strategies for realistically addressing worries. Target Date: 2024-04-26 Frequency: Biweekly  Progress: 65 Modality: individual  Related Interventions Assign the client a homework exercise in which he/she problem-solves a current problem (see Mastery of Your Anxiety and Worry: Workbook by Elenora Fender and Filbert Schilder or Generalized Anxiety Disorder by Elesa Hacker, and Filbert Schilder); review, reinforce success, and provide corrective feedback toward improvement. Teach the client problem-solving strategies involving specifically defining a problem, generating options for addressing it, evaluating the pros and cons of each option, selecting and implementing an optional action, and reevaluating and refining the action. Objective Learn and implement calming skills to reduce overall anxiety and manage anxiety symptoms. Target Date: 2024-04-26 Frequency: Biweekly  Progress: 65 Modality: individual  Related Interventions Assign the client to read about progressive muscle relaxation and other calming strategies in relevant books or treatment manuals (e.g., Progressive Relaxation Training by Twana First; Mastery of Your Anxiety and Worry: Workbook by Earlie Counts). Assign the client homework each session in which he/she practices relaxation exercises daily, gradually applying them progressively from non-anxiety-provoking to anxiety-provoking situations; review and reinforce success while providing corrective feedback toward improvement. Teach the client calming/relaxation skills (e.g., applied relaxation, progressive muscle relaxation, cue controlled relaxation; mindful breathing;  biofeedback) and how to discriminate better between relaxation and tension; teach the client how to apply these skills to his/her daily life. 7. Recognize, accept, and cope with feelings of depression. 8. Reduce overall frequency, intensity, and duration of the anxiety so that daily functioning is not impaired. 9. Resolve the core conflict that is the source of anxiety. 10. Stabilize anxiety level while increasing ability to function on a daily basis. Diagnosis F43.23  Conditions For Discharge Achievement of treatment goals and objectives   Salomon Fick, LCSW

## 2023-12-07 ENCOUNTER — Ambulatory Visit: Payer: Self-pay | Admitting: Psychology

## 2023-12-07 DIAGNOSIS — F4323 Adjustment disorder with mixed anxiety and depressed mood: Secondary | ICD-10-CM

## 2023-12-07 NOTE — Progress Notes (Signed)
 [facility] Counselor/Therapist Progress Note  Patient ID: Jonathon Middleton, MRN: [id number],    Date: 12/07/2023  Time Spent: 12:00pm-12:55pm   55 minutes   Treatment Type: Individual Therapy  Reported Symptoms: stress  Mental Status Exam: Appearance:  Casual     Behavior: Appropriate  Motor: Normal  Speech/Language:  Normal Rate  Affect: Appropriate  Mood: normal  Thought process: normal  Thought content:   WNL  Sensory/Perceptual disturbances:   WNL  Orientation: oriented to person, place, time/date, and situation  Attention: Good  Concentration: Good  Memory: WNL  Fund of knowledge:  Good  Insight:   Good  Judgment:  Good  Impulse Control: Good   Risk Assessment: Danger to Self:  No Self-injurious Behavior: No Danger to Others: No Duty to Warn:no Physical Aggression / Violence:No  Access to Firearms a concern: No  Gang Involvement:No   Subjective: Pt present for face-to-face individual therapy via video.  Pt consents to telehealth video session and is aware of limitations and benefits of virtual sessions. Location of pt: home Location of therapist: home office.   Pt talked about the holidays.   He had dinner with his oldest son but did not hear from his youngest son for awhile.   Pt contacted him and they got together the day after Christmas.  Pt talked about work.  He states it is worse than ever.  He feels very frustrated and worries about how 2025 will be.   Pt talked about the new year.   He does not make new year resolutions.   He is feeling like he is melancholy and is in a funk.    Helped pt process his feelings.   Pt is having post holiday blues and January and February tend to be difficult for him.   Addressed what pt can do to instill something positive in each day.  Pt states it helps him to pick up the phone and talk with someone.  Social connection is uplifting for him.   Identified that pt's self talk of I'm not good enough has crept in.    Addressed how pt can continue to work on self acceptance in the present moment.  Worked on thought reframing.   Worked on self care strategies. Provided supportive therapy.   Interventions: Cognitive Behavioral Therapy and Insight-Oriented  Diagnosis:  F43.23   Plan of Care: Recommend ongoing therapy.  Pt participated in setting treatment goals.  Plan to continue to meet every two weeks.   Treatment Plan Client Abilities/Strengths  Pt is bright, engaging, and motivated for therapy.  Client Treatment Preferences  Individual therapy.  Client Statement of Needs  Improve copings skills and understand herself better. Improve self esteem.  Symptoms  Depressed or irritable mood. Excessive and/or unrealistic worry that is difficult to control occurring more days than not for at least 6 months about a number of events or activities. Hypervigilance (e.g., feeling constantly on edge, experiencing concentration difficulties, having trouble falling or staying asleep, exhibiting a general state of irritability). Low self-esteem. Problems Addressed  Unipolar Depression, Anxiety Goals 1. Alleviate depressive symptoms and return to previous level of effective functioning. 2. Appropriately grieve the loss in order to normalize mood and to return to previously adaptive level of functioning. Objective Learn and implement behavioral strategies to overcome depression. Target Date: 2024-04-26 Frequency: Biweekly  Progress: 65 Modality: individual  Related Interventions Assist the client in developing skills that increase the likelihood of deriving pleasure from behavioral activation (e.g., assertiveness skills, developing  an exercise plan, less internal/more external focus, increased social involvement); reinforce success. Engage the client in behavioral activation, increasing his/her activity level and contact with sources of reward, while identifying processes that inhibit activation. use behavioral  techniques such as instruction, rehearsal, role-playing, role reversal, as needed, to facilitate activity in the client's daily life; reinforce success. 3. Develop healthy interpersonal relationships that lead to the alleviation and help prevent the relapse of depression. 4. Develop healthy thinking patterns and beliefs about self, others, and the world that lead to the alleviation and help prevent the relapse of depression. 5. Enhance ability to effectively cope with the full variety of life's worries and anxieties. 6. Learn and implement coping skills that result in a reduction of anxiety and worry, and improved daily functioning. Objective Learn and implement problem-solving strategies for realistically addressing worries. Target Date: 2024-04-26 Frequency: Biweekly  Progress: 65 Modality: individual  Related Interventions Assign the client a homework exercise in which he/she problem-solves a current problem (see Mastery of Your Anxiety and Worry: Workbook by Richarda and Jonne or Generalized Anxiety Disorder by Delores Filler, and Jonne); review, reinforce success, and provide corrective feedback toward improvement. Teach the client problem-solving strategies involving specifically defining a problem, generating options for addressing it, evaluating the pros and cons of each option, selecting and implementing an optional action, and reevaluating and refining the action. Objective Learn and implement calming skills to reduce overall anxiety and manage anxiety symptoms. Target Date: 2024-04-26 Frequency: Biweekly  Progress: 65 Modality: individual  Related Interventions Assign the client to read about progressive muscle relaxation and other calming strategies in relevant books or treatment manuals (e.g., Progressive Relaxation Training by Thornell armin Collier; Mastery of Your Anxiety and Worry: Workbook by Richarda armin Jonne). Assign the client homework each session in which he/she practices  relaxation exercises daily, gradually applying them progressively from non-anxiety-provoking to anxiety-provoking situations; review and reinforce success while providing corrective feedback toward improvement. Teach the client calming/relaxation skills (e.g., applied relaxation, progressive muscle relaxation, cue controlled relaxation; mindful breathing; biofeedback) and how to discriminate better between relaxation and tension; teach the client how to apply these skills to his/her daily life. 7. Recognize, accept, and cope with feelings of depression. 8. Reduce overall frequency, intensity, and duration of the anxiety so that daily functioning is not impaired. 9. Resolve the core conflict that is the source of anxiety. 10. Stabilize anxiety level while increasing ability to function on a daily basis. Diagnosis F43.23  Conditions For Discharge Achievement of treatment goals and objectives   Veva Alma, LCSW

## 2023-12-21 ENCOUNTER — Ambulatory Visit (INDEPENDENT_AMBULATORY_CARE_PROVIDER_SITE_OTHER): Payer: Self-pay | Admitting: Psychology

## 2023-12-21 DIAGNOSIS — F4323 Adjustment disorder with mixed anxiety and depressed mood: Secondary | ICD-10-CM

## 2023-12-21 NOTE — Progress Notes (Signed)
[facility] Counselor/Therapist Progress Note  Patient ID: Jonathon Middleton, MRN: [id number],    Date: 12/21/2023  Time Spent: 12:00pm-12:55pm   55 minutes   Treatment Type: Individual Therapy  Reported Symptoms: stress  Mental Status Exam: Appearance:  Casual     Behavior: Appropriate  Motor: Normal  Speech/Language:  Normal Rate  Affect: Appropriate  Mood: normal  Thought process: normal  Thought content:   WNL  Sensory/Perceptual disturbances:   WNL  Orientation: oriented to person, place, time/date, and situation  Attention: Good  Concentration: Good  Memory: WNL  Fund of knowledge:  Good  Insight:   Good  Judgment:  Good  Impulse Control: Good   Risk Assessment: Danger to Self:  No Self-injurious Behavior: No Danger to Others: No Duty to Warn:no Physical Aggression / Violence:No  Access to Firearms a concern: No  Gang Involvement:No   Subjective: Pt present for face-to-face individual therapy via video.  Pt consents to telehealth video session and is aware of limitations and benefits of virtual sessions. Location of pt: home Location of therapist: home office.   Pt talked about work.  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  Addressed the work challenges.  At times pt feels a lack of respect from clients which makes pt angry.  It also triggers his feelings of "I'm not good enough".  Helped pt process his feelings and work dynamics.      Addressed how pt can continue to work on self acceptance in the present moment.  Worked on thought reframing.   Pt talked about feeling overwhelmed bc there is so much out of his control.   Worked on letting things go and stress management.  Worked on self care strategies. Provided supportive therapy.   Interventions: Cognitive Behavioral Therapy and Insight-Oriented  Diagnosis:  F43.23   Plan of Care: Recommend ongoing therapy.  Pt participated in setting treatment goals.  Plan to continue to  meet every two weeks.   Treatment Plan Client Abilities/Strengths  Pt is bright, engaging, and motivated for therapy.  Client Treatment Preferences  Individual therapy.  Client Statement of Needs  Improve copings skills and understand herself better. Improve self esteem.  Symptoms  Depressed or irritable mood. Excessive and/or unrealistic worry that is difficult to control occurring more days than not for at least 6 months about a number of events or activities. Hypervigilance (e.g., feeling constantly on edge, experiencing concentration difficulties, having trouble falling or staying asleep, exhibiting a general state of irritability). Low self-esteem. Problems Addressed  Unipolar Depression, Anxiety Goals 1. Alleviate depressive symptoms and return to previous level of effective functioning. 2. Appropriately grieve the loss in order to normalize mood and to return to previously adaptive level of functioning. Objective Learn and implement behavioral strategies to overcome depression. Target Date: 2024-04-26 Frequency: Biweekly  Progress: 65 Modality: individual  Related Interventions Assist the client in developing skills that increase the likelihood of deriving pleasure from behavioral activation (e.g., assertiveness skills, developing an exercise plan, less internal/more external focus, increased social involvement); reinforce success. Engage the client in "behavioral activation," increasing his/her activity level and contact with sources of reward, while identifying processes that inhibit activation. use behavioral techniques such as instruction, rehearsal, role-playing, role reversal, as needed, to facilitate activity in the client's daily life; reinforce success. 3. Develop healthy interpersonal relationships that lead to the alleviation and help prevent the relapse of depression. 4. Develop healthy thinking patterns and beliefs about self, others, and the world  that lead to the  alleviation and help prevent the relapse of depression. 5. Enhance ability to effectively cope with the full variety of life's worries and anxieties. 6. Learn and implement coping skills that result in a reduction of anxiety and worry, and improved daily functioning. Objective Learn and implement problem-solving strategies for realistically addressing worries. Target Date: 2024-04-26 Frequency: Biweekly  Progress: 65 Modality: individual  Related Interventions Assign the client a homework exercise in which he/she problem-solves a current problem (see Mastery of Your Anxiety and Worry: Workbook by Elenora Fender and Filbert Schilder or Generalized Anxiety Disorder by Elesa Hacker, and Filbert Schilder); review, reinforce success, and provide corrective feedback toward improvement. Teach the client problem-solving strategies involving specifically defining a problem, generating options for addressing it, evaluating the pros and cons of each option, selecting and implementing an optional action, and reevaluating and refining the action. Objective Learn and implement calming skills to reduce overall anxiety and manage anxiety symptoms. Target Date: 2024-04-26 Frequency: Biweekly  Progress: 65 Modality: individual  Related Interventions Assign the client to read about progressive muscle relaxation and other calming strategies in relevant books or treatment manuals (e.g., Progressive Relaxation Training by Twana First; Mastery of Your Anxiety and Worry: Workbook by Earlie Counts). Assign the client homework each session in which he/she practices relaxation exercises daily, gradually applying them progressively from non-anxiety-provoking to anxiety-provoking situations; review and reinforce success while providing corrective feedback toward improvement. Teach the client calming/relaxation skills (e.g., applied relaxation, progressive muscle relaxation, cue controlled relaxation; mindful breathing; biofeedback) and  how to discriminate better between relaxation and tension; teach the client how to apply these skills to his/her daily life. 7. Recognize, accept, and cope with feelings of depression. 8. Reduce overall frequency, intensity, and duration of the anxiety so that daily functioning is not impaired. 9. Resolve the core conflict that is the source of anxiety. 10. Stabilize anxiety level while increasing ability to function on a daily basis. Diagnosis F43.23  Conditions For Discharge Achievement of treatment goals and objectives   Salomon Fick, LCSW

## 2023-12-29 ENCOUNTER — Ambulatory Visit (INDEPENDENT_AMBULATORY_CARE_PROVIDER_SITE_OTHER): Payer: Medicare Other | Admitting: Family Medicine

## 2023-12-29 ENCOUNTER — Encounter: Payer: Self-pay | Admitting: Family Medicine

## 2023-12-29 VITALS — BP 126/84 | HR 80 | Temp 97.7°F | Ht 69.0 in | Wt 213.0 lb

## 2023-12-29 DIAGNOSIS — I480 Paroxysmal atrial fibrillation: Secondary | ICD-10-CM

## 2023-12-29 DIAGNOSIS — E785 Hyperlipidemia, unspecified: Secondary | ICD-10-CM

## 2023-12-29 DIAGNOSIS — I77810 Thoracic aortic ectasia: Secondary | ICD-10-CM | POA: Diagnosis not present

## 2023-12-29 DIAGNOSIS — K1321 Leukoplakia of oral mucosa, including tongue: Secondary | ICD-10-CM

## 2023-12-29 DIAGNOSIS — E1169 Type 2 diabetes mellitus with other specified complication: Secondary | ICD-10-CM

## 2023-12-29 LAB — POCT GLYCOSYLATED HEMOGLOBIN (HGB A1C): Hemoglobin A1C: 6.1 % — AB (ref 4.0–5.6)

## 2023-12-29 NOTE — Assessment & Plan Note (Signed)
Appreciate oral surgeon care. He was told no f/u necessary.

## 2023-12-29 NOTE — Progress Notes (Signed)
Ph: [phone] Fax: [phone]   Patient ID: Jonathon Middleton, male    DOB: 02-May-1951, 73 y.o.   MRN: [id number]  This visit was conducted in person.  BP 126/84   Pulse 80   Temp 97.7 F (36.5 C) (Oral)   Ht 5\' 9"  (1.753 m)   Wt 213 lb (96.6 kg)   SpO2 96%   BMI 31.45 kg/m    CC: 6 mo DM f/u visit  Subjective:   HPI: Jonathon Middleton is a 73 y.o. male presenting on 12/29/2023 for Medical Management of Chronic Issues (Here for 6 mo DM f/u.)   Recent oral surgeon (Dr Shea Evans) biopsy of tongue lesion to R ventral tongue - moderate epithelial dysplasia and hyperkeratosis with margins affected - told he was fine.   DM - does regularly check sugars 118 this morning fasting. Compliant with antihyperglycemic regimen which includes: diet control. Denies low sugars or hypoglycemic symptoms. Denies paresthesias, blurry vision. Last diabetic eye exam 06/2023. Glucometer brand: accuchek guide. Last foot exam: DUE. DSME: 2010. Lab Results  Component Value Date   HGBA1C 6.1 (A) 12/29/2023   Diabetic Foot Exam - Simple   Simple Foot Form Diabetic Foot exam was performed with the following findings: Yes 12/29/2023  8:00 AM  Visual Inspection No deformities, no ulcerations, no other skin breakdown bilaterally: Yes Sensation Testing Intact to touch and monofilament testing bilaterally: Yes Pulse Check Posterior Tibialis and Dorsalis pulse intact bilaterally: Yes Comments No claudication Callus to left medial foot    Lab Results  Component Value Date   MICROALBUR <0.7 06/28/2023    Saw cardiology:  CT cardiac scoring - 1219 (87% for age). Goal LDL <55.  Also noted ectasia of ascending thoracic aorta (4.4 cm in diameter) - rec annual imaging.  Atrial fibrillation - losartan/hydrochlorothiazide stopped, diltiazem dose increased . Now started on flecainide 100mg  BID PRN afib - he's been taking 1 tablet daily.      Relevant past medical, surgical, family and social history  reviewed and updated as indicated. Interim medical history since our last visit reviewed. Allergies and medications reviewed and updated. Outpatient Medications Prior to Visit  Medication Sig Dispense Refill   Accu-Chek Softclix Lancets lancets Use as instructed to check blood sugar once daily 100 each 3   apixaban (ELIQUIS) 5 MG TABS tablet Take 1 tablet (5 mg total) by mouth 2 (two) times daily. 180 tablet 4   atorvastatin (LIPITOR) 40 MG tablet Take 1 tablet (40 mg total) by mouth daily. 90 tablet 4   Blood Glucose Monitoring Suppl (ACCU-CHEK GUIDE ME) w/Device KIT Use as instructed to check blood sugar once daily 1 kit 0   Cholecalciferol (VITAMIN D) 2000 units CAPS Take 2,000 Units by mouth 2 (two) times daily.      Coenzyme Q10 (CO Q-10) 100 MG CAPS Take 100 mg by mouth daily with breakfast.      diltiazem (CARDIZEM CD) 120 MG 24 hr capsule Take 1 capsule (120 mg total) by mouth 2 (two) times daily. TAKE 1 CAPSULE BY MOUTH TWICE DAILY 180 capsule 3   flecainide (TAMBOCOR) 100 MG tablet Take 1 tablet (100 mg total) by mouth 2 (two) times daily as needed. Take as needed for atrial fibrillation 180 tablet 3   glucose blood (ACCU-CHEK GUIDE) test strip Use as instructed to check blood sugar once daily 100 each 3   metoprolol succinate (TOPROL-XL) 50 MG 24 hr tablet Take 1 tablet (50 mg total) by mouth daily. Take with or  immediately following a meal. 90 tablet 4   omega-3 fish oil (MAXEPA) 1000 MG CAPS capsule Take 1 capsule by mouth 2 (two) times daily.      sertraline (ZOLOFT) 100 MG tablet Take 1 tablet (100 mg total) by mouth daily. 90 tablet 4   TURMERIC PO Take by mouth in the morning and at bedtime.     No facility-administered medications prior to visit.     Per HPI unless specifically indicated in ROS section below Review of Systems  Objective:  BP 126/84   Pulse 80   Temp 97.7 F (36.5 C) (Oral)   Ht 5\' 9"  (1.753 m)   Wt 213 lb (96.6 kg)   SpO2 96%   BMI 31.45 kg/m   Wt  Readings from Last 3 Encounters:  12/29/23 213 lb (96.6 kg)  10/23/23 210 lb 2 oz (95.3 kg)  06/28/23 208 lb 4 oz (94.5 kg)      Physical Exam Vitals and nursing note reviewed.  Constitutional:      Appearance: Normal appearance. He is not ill-appearing.  HENT:     Head: Normocephalic and atraumatic.     Mouth/Throat:     Mouth: Mucous membranes are moist.     Pharynx: Oropharynx is clear. No oropharyngeal exudate or posterior oropharyngeal erythema.  Eyes:     Extraocular Movements: Extraocular movements intact.     Conjunctiva/sclera: Conjunctivae normal.     Pupils: Pupils are equal, round, and reactive to light.  Cardiovascular:     Rate and Rhythm: Normal rate and regular rhythm.     Pulses: Normal pulses.     Heart sounds: Normal heart sounds. No murmur heard. Pulmonary:     Effort: Pulmonary effort is normal. No respiratory distress.     Breath sounds: Normal breath sounds. No wheezing, rhonchi or rales.  Musculoskeletal:     Right lower leg: No edema.     Left lower leg: No edema.     Comments: See HPI for foot exam if done  Skin:    General: Skin is warm and dry.     Findings: No rash.  Neurological:     Mental Status: He is alert.  Psychiatric:        Mood and Affect: Mood normal.        Behavior: Behavior normal.       Results for orders placed or performed in visit on 12/29/23  POCT glycosylated hemoglobin (Hb A1C)   Collection Time: 12/29/23  7:39 AM  Result Value Ref Range   Hemoglobin A1C 6.1 (A) 4.0 - 5.6 %   HbA1c POC (<> result, manual entry)     HbA1c, POC (prediabetic range)     HbA1c, POC (controlled diabetic range)     Lab Results  Component Value Date   CHOL 127 06/28/2023   HDL 28.80 (L) 06/28/2023   LDLCALC 64 06/28/2023   LDLDIRECT 125.0 04/22/2019   TRIG 171.0 (H) 06/28/2023   CHOLHDL 4 06/28/2023    Assessment & Plan:   Problem List Items Addressed This Visit     Paroxysmal atrial fibrillation [facility])   Appreciate cardiology  care.  Continue dilt, eliquis, toprol XL.       Hyperlipidemia associated with type 2 diabetes mellitus (HCC)   Discussed new goal LDL <55 given elevated CAC score. Encouraged healthy diet and lifestyle choices to improve cholesterol control.  The ASCVD Risk score (Arnett DK, et al., 2019) failed to calculate for the following reasons:   The  valid total cholesterol range is 130 to 320 mg/dL       Type 2 diabetes mellitus with other specified complication (HCC) - Primary   Chronic, stable, diet controlled.  Continue following diabetic diet and weight loss efforts.       Relevant Orders   POCT glycosylated hemoglobin (Hb A1C) (Completed)   Leukoplakia of tongue   Appreciate oral surgeon care. He was told no f/u necessary.       Dilation of thoracic aorta (HCC)   4.4cm ectasia by recent CT cardiac score.  Discussed rpt in 1 year to monitor this finding.         No orders of the defined types were placed in this encounter.   Orders Placed This Encounter  Procedures   POCT glycosylated hemoglobin (Hb A1C)    Patient Instructions  You are doing well today Continue working on physical activity routine.  Continue current medicines. Keep cardiology appointment Return in 6 months for wellness visit   Follow up plan: Return in about 6 months (around 06/27/2024) for medicare wellness visit.  Eustaquio Boyden, MD

## 2023-12-29 NOTE — Patient Instructions (Signed)
You are doing well today Continue working on physical activity routine.  Continue current medicines. Keep cardiology appointment Return in 6 months for wellness visit

## 2023-12-29 NOTE — Assessment & Plan Note (Signed)
Appreciate cardiology care.  Continue dilt, eliquis, toprol XL.

## 2023-12-29 NOTE — Assessment & Plan Note (Signed)
Discussed new goal LDL <55 given elevated CAC score. Encouraged healthy diet and lifestyle choices to improve cholesterol control.  The ASCVD Risk score (Arnett DK, et al., 2019) failed to calculate for the following reasons:   The valid total cholesterol range is 130 to 320 mg/dL

## 2023-12-29 NOTE — Assessment & Plan Note (Addendum)
4.4cm ectasia by recent CT cardiac score.  Discussed rpt in 1 year to monitor this finding.

## 2023-12-29 NOTE — Assessment & Plan Note (Addendum)
Chronic, stable, diet controlled.  Continue following diabetic diet and weight loss efforts.

## 2024-01-04 ENCOUNTER — Ambulatory Visit (INDEPENDENT_AMBULATORY_CARE_PROVIDER_SITE_OTHER): Payer: Self-pay | Admitting: Psychology

## 2024-01-04 DIAGNOSIS — F4323 Adjustment disorder with mixed anxiety and depressed mood: Secondary | ICD-10-CM

## 2024-01-04 NOTE — Progress Notes (Signed)
[facility] Counselor/Therapist Progress Note  Patient ID: Sameul Tagle, MRN: [id number],    Date: 01/04/2024  Time Spent: 12:00pm-12:55pm   55 minutes   Treatment Type: Individual Therapy  Reported Symptoms: stress  Mental Status Exam: Appearance:  Casual     Behavior: Appropriate  Motor: Normal  Speech/Language:  Normal Rate  Affect: Appropriate  Mood: normal  Thought process: normal  Thought content:   WNL  Sensory/Perceptual disturbances:   WNL  Orientation: oriented to person, place, time/date, and situation  Attention: Good  Concentration: Good  Memory: WNL  Fund of knowledge:  Good  Insight:   Good  Judgment:  Good  Impulse Control: Good   Risk Assessment: Danger to Self:  No Self-injurious Behavior: No Danger to Others: No Duty to Warn:no Physical Aggression / Violence:No  Access to Firearms a concern: No  Gang Involvement:No   Subjective: Pt present for face-to-face individual therapy via video.  Pt consents to telehealth video session and is aware of limitations and benefits of virtual sessions. Location of pt: home Location of therapist: home office.   Pt talked about his friend Roe Coombs staying with him last weekend.  They had a good visit.   Pt had lunch with his son as well.   Pt talked about "blowing up" on his boss Alinda Money at work.   Alinda Money accused pt of having negative energy.  Pt states he did not back down bc the things pt was saying needed to be said.  Pt feels like he stood up for himself and had needed to do that for awhile.   Alinda Money was angry with pt and did not really hear what pt had to say.   This was very frustrating for pt.  Addressed how pt can communicate more effectively with assertiveness.   Pt talked about his health.  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  It could lead to an anorysm.  Pt is going to have a follow up with his doctor to address the issues.  Addressed pt's health concerns. Pt talked about his ex Lanora Manis working  with his on a search project.  Pt had to invite Lanora Manis to his team.  Addressed how this impacted pt.   He is being professional even though he wishes she was not on his team.   Worked on self care strategies. Provided supportive therapy.   Interventions: Cognitive Behavioral Therapy and Insight-Oriented  Diagnosis:  F43.23   Plan of Care: Recommend ongoing therapy.  Pt participated in setting treatment goals.  Plan to continue to meet every two weeks.   Treatment Plan Client Abilities/Strengths  Pt is bright, engaging, and motivated for therapy.  Client Treatment Preferences  Individual therapy.  Client Statement of Needs  Improve copings skills and understand herself better. Improve self esteem.  Symptoms  Depressed or irritable mood. Excessive and/or unrealistic worry that is difficult to control occurring more days than not for at least 6 months about a number of events or activities. Hypervigilance (e.g., feeling constantly on edge, experiencing concentration difficulties, having trouble falling or staying asleep, exhibiting a general state of irritability). Low self-esteem. Problems Addressed  Unipolar Depression, Anxiety Goals 1. Alleviate depressive symptoms and return to previous level of effective functioning. 2. Appropriately grieve the loss in order to normalize mood and to return to previously adaptive level of functioning. Objective Learn and implement behavioral strategies to overcome depression. Target Date: 2024-04-26 Frequency: Biweekly  Progress: 65 Modality: individual  Related Interventions Assist the client in  developing skills that increase the likelihood of deriving pleasure from behavioral activation (e.g., assertiveness skills, developing an exercise plan, less internal/more external focus, increased social involvement); reinforce success. Engage the client in "behavioral activation," increasing his/her activity level and contact with sources of reward,  while identifying processes that inhibit activation. use behavioral techniques such as instruction, rehearsal, role-playing, role reversal, as needed, to facilitate activity in the client's daily life; reinforce success. 3. Develop healthy interpersonal relationships that lead to the alleviation and help prevent the relapse of depression. 4. Develop healthy thinking patterns and beliefs about self, others, and the world that lead to the alleviation and help prevent the relapse of depression. 5. Enhance ability to effectively cope with the full variety of life's worries and anxieties. 6. Learn and implement coping skills that result in a reduction of anxiety and worry, and improved daily functioning. Objective Learn and implement problem-solving strategies for realistically addressing worries. Target Date: 2024-04-26 Frequency: Biweekly  Progress: 65 Modality: individual  Related Interventions Assign the client a homework exercise in which he/she problem-solves a current problem (see Mastery of Your Anxiety and Worry: Workbook by Elenora Fender and Filbert Schilder or Generalized Anxiety Disorder by Elesa Hacker, and Filbert Schilder); review, reinforce success, and provide corrective feedback toward improvement. Teach the client problem-solving strategies involving specifically defining a problem, generating options for addressing it, evaluating the pros and cons of each option, selecting and implementing an optional action, and reevaluating and refining the action. Objective Learn and implement calming skills to reduce overall anxiety and manage anxiety symptoms. Target Date: 2024-04-26 Frequency: Biweekly  Progress: 65 Modality: individual  Related Interventions Assign the client to read about progressive muscle relaxation and other calming strategies in relevant books or treatment manuals (e.g., Progressive Relaxation Training by Twana First; Mastery of Your Anxiety and Worry: Workbook by Earlie Counts). Assign the client homework each session in which he/she practices relaxation exercises daily, gradually applying them progressively from non-anxiety-provoking to anxiety-provoking situations; review and reinforce success while providing corrective feedback toward improvement. Teach the client calming/relaxation skills (e.g., applied relaxation, progressive muscle relaxation, cue controlled relaxation; mindful breathing; biofeedback) and how to discriminate better between relaxation and tension; teach the client how to apply these skills to his/her daily life. 7. Recognize, accept, and cope with feelings of depression. 8. Reduce overall frequency, intensity, and duration of the anxiety so that daily functioning is not impaired. 9. Resolve the core conflict that is the source of anxiety. 10. Stabilize anxiety level while increasing ability to function on a daily basis. Diagnosis F43.23  Conditions For Discharge Achievement of treatment goals and objectives   Salomon Fick, LCSW

## 2024-01-18 ENCOUNTER — Ambulatory Visit: Payer: Self-pay | Admitting: Psychology

## 2024-01-18 DIAGNOSIS — F4323 Adjustment disorder with mixed anxiety and depressed mood: Secondary | ICD-10-CM

## 2024-01-18 NOTE — Progress Notes (Signed)
[facility] Counselor/Therapist Progress Note  Patient ID: Jonathon Middleton, MRN: [id number],    Date: 01/18/2024  Time Spent: 12:00pm-12:55pm   55 minutes   Treatment Type: Individual Therapy  Reported Symptoms: stress  Mental Status Exam: Appearance:  Casual     Behavior: Appropriate  Motor: Normal  Speech/Language:  Normal Rate  Affect: Appropriate  Mood: normal  Thought process: normal  Thought content:   WNL  Sensory/Perceptual disturbances:   WNL  Orientation: oriented to person, place, time/date, and situation  Attention: Good  Concentration: Good  Memory: WNL  Fund of knowledge:  Good  Insight:   Good  Judgment:  Good  Impulse Control: Good   Risk Assessment: Danger to Self:  No Self-injurious Behavior: No Danger to Others: No Duty to Warn:no Physical Aggression / Violence:No  Access to Firearms a concern: No  Gang Involvement:No   Subjective: Pt present for face-to-face individual therapy via video.  Pt consents to telehealth video session and is aware of limitations and benefits of virtual sessions. Location of pt: home Location of therapist: home office.   Pt talked about work.  Work has been very stressful.  Addressed the issues and how they impact pt.   Pt talked about getting a call from [place] and she talked with pt about her 49 yo son who lives at home with her.  Her son is drinking heavily and having violent behavior.  Pt listened to [place] and encouraged her to go to a safe place.  Pt did not want to get involved in the situation bc he and Lanora Manis have not been seeing each other for a long time.  Lanora Manis ended up getting her son committed.   Pt was helpful to [place] but did not get over involved.    Pt talked about going to his granddaughter's cheerleading.  The family went out to dinner afterward.  Pt found out his oldest son is going to propose to his girlfriend Boneta Lucks.  Pt is happy about this bc he really likes Boneta Lucks.   Pt  talked about "being ok but not feeling joy".  He states he is not exercising or eating well which impacts how he feels.   Worked on self care strategies. Provided supportive therapy.   Interventions: Cognitive Behavioral Therapy and Insight-Oriented  Diagnosis:  F43.23   Plan of Care: Recommend ongoing therapy.  Pt participated in setting treatment goals.  Plan to continue to meet every two weeks.   Treatment Plan Client Abilities/Strengths  Pt is bright, engaging, and motivated for therapy.  Client Treatment Preferences  Individual therapy.  Client Statement of Needs  Improve copings skills and understand herself better. Improve self esteem.  Symptoms  Depressed or irritable mood. Excessive and/or unrealistic worry that is difficult to control occurring more days than not for at least 6 months about a number of events or activities. Hypervigilance (e.g., feeling constantly on edge, experiencing concentration difficulties, having trouble falling or staying asleep, exhibiting a general state of irritability). Low self-esteem. Problems Addressed  Unipolar Depression, Anxiety Goals 1. Alleviate depressive symptoms and return to previous level of effective functioning. 2. Appropriately grieve the loss in order to normalize mood and to return to previously adaptive level of functioning. Objective Learn and implement behavioral strategies to overcome depression. Target Date: 2024-04-26 Frequency: Biweekly  Progress: 65 Modality: individual  Related Interventions Assist the client in developing skills that increase the likelihood of deriving pleasure from behavioral activation (e.g., assertiveness skills, developing an exercise plan, less internal/more  external focus, increased social involvement); reinforce success. Engage the client in "behavioral activation," increasing his/her activity level and contact with sources of reward, while identifying processes that inhibit activation. use  behavioral techniques such as instruction, rehearsal, role-playing, role reversal, as needed, to facilitate activity in the client's daily life; reinforce success. 3. Develop healthy interpersonal relationships that lead to the alleviation and help prevent the relapse of depression. 4. Develop healthy thinking patterns and beliefs about self, others, and the world that lead to the alleviation and help prevent the relapse of depression. 5. Enhance ability to effectively cope with the full variety of life's worries and anxieties. 6. Learn and implement coping skills that result in a reduction of anxiety and worry, and improved daily functioning. Objective Learn and implement problem-solving strategies for realistically addressing worries. Target Date: 2024-04-26 Frequency: Biweekly  Progress: 65 Modality: individual  Related Interventions Assign the client a homework exercise in which he/she problem-solves a current problem (see Mastery of Your Anxiety and Worry: Workbook by Elenora Fender and Filbert Schilder or Generalized Anxiety Disorder by Elesa Hacker, and Filbert Schilder); review, reinforce success, and provide corrective feedback toward improvement. Teach the client problem-solving strategies involving specifically defining a problem, generating options for addressing it, evaluating the pros and cons of each option, selecting and implementing an optional action, and reevaluating and refining the action. Objective Learn and implement calming skills to reduce overall anxiety and manage anxiety symptoms. Target Date: 2024-04-26 Frequency: Biweekly  Progress: 65 Modality: individual  Related Interventions Assign the client to read about progressive muscle relaxation and other calming strategies in relevant books or treatment manuals (e.g., Progressive Relaxation Training by Twana First; Mastery of Your Anxiety and Worry: Workbook by Earlie Counts). Assign the client homework each session in which he/she  practices relaxation exercises daily, gradually applying them progressively from non-anxiety-provoking to anxiety-provoking situations; review and reinforce success while providing corrective feedback toward improvement. Teach the client calming/relaxation skills (e.g., applied relaxation, progressive muscle relaxation, cue controlled relaxation; mindful breathing; biofeedback) and how to discriminate better between relaxation and tension; teach the client how to apply these skills to his/her daily life. 7. Recognize, accept, and cope with feelings of depression. 8. Reduce overall frequency, intensity, and duration of the anxiety so that daily functioning is not impaired. 9. Resolve the core conflict that is the source of anxiety. 10. Stabilize anxiety level while increasing ability to function on a daily basis. Diagnosis F43.23  Conditions For Discharge Achievement of treatment goals and objectives   Salomon Fick, LCSW

## 2024-02-01 ENCOUNTER — Ambulatory Visit: Payer: Self-pay | Admitting: Psychology

## 2024-02-01 DIAGNOSIS — F4323 Adjustment disorder with mixed anxiety and depressed mood: Secondary | ICD-10-CM

## 2024-02-01 NOTE — Progress Notes (Signed)
 [facility] Counselor/Therapist Progress Note  Patient ID: Jonathon Middleton, MRN: [id number],    Date: 02/01/2024  Time Spent: 12:00pm-12:55pm   55 minutes   Treatment Type: Individual Therapy  Reported Symptoms: stress  Mental Status Exam: Appearance:  Casual     Behavior: Appropriate  Motor: Normal  Speech/Language:  Normal Rate  Affect: Appropriate  Mood: normal  Thought process: normal  Thought content:   WNL  Sensory/Perceptual disturbances:   WNL  Orientation: oriented to person, place, time/date, and situation  Attention: Good  Concentration: Good  Memory: WNL  Fund of knowledge:  Good  Insight:   Good  Judgment:  Good  Impulse Control: Good   Risk Assessment: Danger to Self:  No Self-injurious Behavior: No Danger to Others: No Duty to Warn:no Physical Aggression / Violence:No  Access to Firearms a concern: No  Gang Involvement:No   Subjective: Pt present for face-to-face individual therapy via video.  Pt consents to telehealth video session and is aware of limitations and benefits of virtual sessions. Location of pt: home Location of therapist: home office.   Pt talked about his relationship with Lanora Manis who is on his work team.   She has talked with pt about her personal life.  She is trying to weave herself back into pt's personal life and he is trying to keep boundaries.   Pt talked about listening to podcasts about optimism.  Pt has been thinking about his self esteem and procrastination and his childhood.  His self esteem and procrastination started in high school.   Pt did not want to ask for help.  Addressed pt's quest for figuring out the "why's" of his behavior.    Worked on creating insight and looking at how to apply it to present moment changes he wants to make.   Worked on self care strategies. Provided supportive therapy.   Interventions: Cognitive Behavioral Therapy and Insight-Oriented  Diagnosis:  F43.23   Plan of Care:  Recommend ongoing therapy.  Pt participated in setting treatment goals.  Plan to continue to meet every two weeks.   Treatment Plan Client Abilities/Strengths  Pt is bright, engaging, and motivated for therapy.  Client Treatment Preferences  Individual therapy.  Client Statement of Needs  Improve copings skills and understand herself better. Improve self esteem.  Symptoms  Depressed or irritable mood. Excessive and/or unrealistic worry that is difficult to control occurring more days than not for at least 6 months about a number of events or activities. Hypervigilance (e.g., feeling constantly on edge, experiencing concentration difficulties, having trouble falling or staying asleep, exhibiting a general state of irritability). Low self-esteem. Problems Addressed  Unipolar Depression, Anxiety Goals 1. Alleviate depressive symptoms and return to previous level of effective functioning. 2. Appropriately grieve the loss in order to normalize mood and to return to previously adaptive level of functioning. Objective Learn and implement behavioral strategies to overcome depression. Target Date: 2024-04-26 Frequency: Biweekly  Progress: 65 Modality: individual  Related Interventions Assist the client in developing skills that increase the likelihood of deriving pleasure from behavioral activation (e.g., assertiveness skills, developing an exercise plan, less internal/more external focus, increased social involvement); reinforce success. Engage the client in "behavioral activation," increasing his/her activity level and contact with sources of reward, while identifying processes that inhibit activation. use behavioral techniques such as instruction, rehearsal, role-playing, role reversal, as needed, to facilitate activity in the client's daily life; reinforce success. 3. Develop healthy interpersonal relationships that lead to the alleviation and help prevent the  relapse of depression. 4. Develop  healthy thinking patterns and beliefs about self, others, and the world that lead to the alleviation and help prevent the relapse of depression. 5. Enhance ability to effectively cope with the full variety of life's worries and anxieties. 6. Learn and implement coping skills that result in a reduction of anxiety and worry, and improved daily functioning. Objective Learn and implement problem-solving strategies for realistically addressing worries. Target Date: 2024-04-26 Frequency: Biweekly  Progress: 65 Modality: individual  Related Interventions Assign the client a homework exercise in which he/she problem-solves a current problem (see Mastery of Your Anxiety and Worry: Workbook by Elenora Fender and Filbert Schilder or Generalized Anxiety Disorder by Elesa Hacker, and Filbert Schilder); review, reinforce success, and provide corrective feedback toward improvement. Teach the client problem-solving strategies involving specifically defining a problem, generating options for addressing it, evaluating the pros and cons of each option, selecting and implementing an optional action, and reevaluating and refining the action. Objective Learn and implement calming skills to reduce overall anxiety and manage anxiety symptoms. Target Date: 2024-04-26 Frequency: Biweekly  Progress: 65 Modality: individual  Related Interventions Assign the client to read about progressive muscle relaxation and other calming strategies in relevant books or treatment manuals (e.g., Progressive Relaxation Training by Twana First; Mastery of Your Anxiety and Worry: Workbook by Earlie Counts). Assign the client homework each session in which he/she practices relaxation exercises daily, gradually applying them progressively from non-anxiety-provoking to anxiety-provoking situations; review and reinforce success while providing corrective feedback toward improvement. Teach the client calming/relaxation skills (e.g., applied relaxation,  progressive muscle relaxation, cue controlled relaxation; mindful breathing; biofeedback) and how to discriminate better between relaxation and tension; teach the client how to apply these skills to his/her daily life. 7. Recognize, accept, and cope with feelings of depression. 8. Reduce overall frequency, intensity, and duration of the anxiety so that daily functioning is not impaired. 9. Resolve the core conflict that is the source of anxiety. 10. Stabilize anxiety level while increasing ability to function on a daily basis. Diagnosis F43.23  Conditions For Discharge Achievement of treatment goals and objectives   Salomon Fick, LCSW

## 2024-02-15 ENCOUNTER — Ambulatory Visit: Payer: Self-pay | Admitting: Psychology

## 2024-02-15 DIAGNOSIS — F4323 Adjustment disorder with mixed anxiety and depressed mood: Secondary | ICD-10-CM

## 2024-02-15 NOTE — Progress Notes (Signed)
 [facility] Counselor/Therapist Progress Note  Patient ID: Jonathon Middleton, MRN: [id number],    Date: 02/15/2024  Time Spent: 12:00pm-12:55pm   55 minutes   Treatment Type: Individual Therapy  Reported Symptoms: stress  Mental Status Exam: Appearance:  Casual     Behavior: Appropriate  Motor: Normal  Speech/Language:  Normal Rate  Affect: Appropriate  Mood: normal  Thought process: normal  Thought content:   WNL  Sensory/Perceptual disturbances:   WNL  Orientation: oriented to person, place, time/date, and situation  Attention: Good  Concentration: Good  Memory: WNL  Fund of knowledge:  Good  Insight:   Good  Judgment:  Good  Impulse Control: Good   Risk Assessment: Danger to Self:  No Self-injurious Behavior: No Danger to Others: No Duty to Warn:no Physical Aggression / Violence:No  Access to Firearms a concern: No  Gang Involvement:No   Subjective: Pt present for face-to-face individual therapy via video.  Pt consents to telehealth video session and is aware of limitations and benefits of virtual sessions. Location of pt: home Location of therapist: home office.   Pt talked about planning to meet his friend [name] at a car show in [place] in a few weeks. Pt's oldest son is going to propose to his girlfriend.  Pt is happy about this bc he really likes his son's girlfriend.   Pt has had talks with Lanora Manis who is having problems with her son.  Pt listens to [place] about her problems but is keeping boundaries.   Pt talked about work.  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  Pt is gaining some perspective about the things in his life he is grateful for given other people's struggles.   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  Addressed how frustrating this is for pt. Pt has been thinking about his self worth.   Pt questions whether he is "good enough"  but is trying not to get stuck in those thoughts.  Addressed the self talk that does not serve him well and worked on thought reframing. Worked on self esteem issues.   Worked on self care strategies. Provided supportive therapy.   Interventions: Cognitive Behavioral Therapy and Insight-Oriented  Diagnosis:  F43.23   Plan of Care: Recommend ongoing therapy.  Pt participated in setting treatment goals.  Plan to continue to meet every two weeks.   Treatment Plan Client Abilities/Strengths  Pt is bright, engaging, and motivated for therapy.  Client Treatment Preferences  Individual therapy.  Client Statement of Needs  Improve copings skills and understand herself better. Improve self esteem.  Symptoms  Depressed or irritable mood. Excessive and/or unrealistic worry that is difficult to control occurring more days than not for at least 6 months about a number of events or activities. Hypervigilance (e.g., feeling constantly on edge, experiencing concentration difficulties, having trouble falling or staying asleep, exhibiting a general state of irritability). Low self-esteem. Problems Addressed  Unipolar Depression, Anxiety Goals 1. Alleviate depressive symptoms and return to previous level of effective functioning. 2. Appropriately grieve the loss in order to normalize mood and to return to previously adaptive level of functioning. Objective Learn and implement behavioral strategies to overcome depression. Target Date: 2024-04-26 Frequency: Biweekly  Progress: 65 Modality: individual  Related Interventions Assist the client in developing skills that increase the likelihood of deriving pleasure from behavioral activation (e.g., assertiveness skills, developing an exercise plan, less  internal/more external focus, increased social involvement); reinforce success. Engage the client in "behavioral activation," increasing his/her activity level and contact with sources of reward, while  identifying processes that inhibit activation. use behavioral techniques such as instruction, rehearsal, role-playing, role reversal, as needed, to facilitate activity in the client's daily life; reinforce success. 3. Develop healthy interpersonal relationships that lead to the alleviation and help prevent the relapse of depression. 4. Develop healthy thinking patterns and beliefs about self, others, and the world that lead to the alleviation and help prevent the relapse of depression. 5. Enhance ability to effectively cope with the full variety of life's worries and anxieties. 6. Learn and implement coping skills that result in a reduction of anxiety and worry, and improved daily functioning. Objective Learn and implement problem-solving strategies for realistically addressing worries. Target Date: 2024-04-26 Frequency: Biweekly  Progress: 65 Modality: individual  Related Interventions Assign the client a homework exercise in which he/she problem-solves a current problem (see Mastery of Your Anxiety and Worry: Workbook by Elenora Fender and Filbert Schilder or Generalized Anxiety Disorder by Elesa Hacker, and Filbert Schilder); review, reinforce success, and provide corrective feedback toward improvement. Teach the client problem-solving strategies involving specifically defining a problem, generating options for addressing it, evaluating the pros and cons of each option, selecting and implementing an optional action, and reevaluating and refining the action. Objective Learn and implement calming skills to reduce overall anxiety and manage anxiety symptoms. Target Date: 2024-04-26 Frequency: Biweekly  Progress: 65 Modality: individual  Related Interventions Assign the client to read about progressive muscle relaxation and other calming strategies in relevant books or treatment manuals (e.g., Progressive Relaxation Training by Twana First; Mastery of Your Anxiety and Worry: Workbook by Earlie Counts). Assign  the client homework each session in which he/she practices relaxation exercises daily, gradually applying them progressively from non-anxiety-provoking to anxiety-provoking situations; review and reinforce success while providing corrective feedback toward improvement. Teach the client calming/relaxation skills (e.g., applied relaxation, progressive muscle relaxation, cue controlled relaxation; mindful breathing; biofeedback) and how to discriminate better between relaxation and tension; teach the client how to apply these skills to his/her daily life. 7. Recognize, accept, and cope with feelings of depression. 8. Reduce overall frequency, intensity, and duration of the anxiety so that daily functioning is not impaired. 9. Resolve the core conflict that is the source of anxiety. 10. Stabilize anxiety level while increasing ability to function on a daily basis. Diagnosis F43.23  Conditions For Discharge Achievement of treatment goals and objectives   Salomon Fick, LCSW

## 2024-02-29 ENCOUNTER — Ambulatory Visit: Payer: Self-pay | Admitting: Psychology

## 2024-02-29 DIAGNOSIS — F4323 Adjustment disorder with mixed anxiety and depressed mood: Secondary | ICD-10-CM

## 2024-02-29 NOTE — Progress Notes (Signed)
 [facility] Counselor/Therapist Progress Note  Patient ID: Jonathon Middleton, MRN: [id number],    Date: 02/29/2024  Time Spent: 12:00pm-12:55pm   55 minutes   Treatment Type: Individual Therapy  Reported Symptoms: stress  Mental Status Exam: Appearance:  Casual     Behavior: Appropriate  Motor: Normal  Speech/Language:  Normal Rate  Affect: Appropriate  Mood: normal  Thought process: normal  Thought content:   WNL  Sensory/Perceptual disturbances:   WNL  Orientation: oriented to person, place, time/date, and situation  Attention: Good  Concentration: Good  Memory: WNL  Fund of knowledge:  Good  Insight:   Good  Judgment:  Good  Impulse Control: Good   Risk Assessment: Danger to Self:  No Self-injurious Behavior: No Danger to Others: No Duty to Warn:no Physical Aggression / Violence:No  Access to Firearms a concern: No  Gang Involvement:No   Subjective: Pt present for face-to-face individual therapy via video.  Pt consents to telehealth video session and is aware of limitations and benefits of virtual sessions. Location of pt: home Location of therapist: home office.   Pt talked about his family.   He attended his 72 yo grand daughter's birthday party.  He had a nice time with family.   Pt is also relieved that his son got a job.  Pt talked about work.  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  He is also working with some frustrating cases. Pt has been listening to podcasts about resilience.  He is noticing where he is resilient and where he needs to work on it.  Pt is also working on focusing on gratitude.   Pt talked about worries about political issues and what is going on in the country.  He is trying to work on present moment mindfulness to help him not feel anxious.   Pt is feeling good overall and is trying to savor it rather than worry about how to "make it last".   Worked on self care  strategies. Provided supportive therapy.   Interventions: Cognitive Behavioral Therapy and Insight-Oriented  Diagnosis:  F43.23   Plan of Care: Recommend ongoing therapy.  Pt participated in setting treatment goals.  Plan to continue to meet every two weeks.   Treatment Plan Client Abilities/Strengths  Pt is bright, engaging, and motivated for therapy.  Client Treatment Preferences  Individual therapy.  Client Statement of Needs  Improve copings skills and understand herself better. Improve self esteem.  Symptoms  Depressed or irritable mood. Excessive and/or unrealistic worry that is difficult to control occurring more days than not for at least 6 months about a number of events or activities. Hypervigilance (e.g., feeling constantly on edge, experiencing concentration difficulties, having trouble falling or staying asleep, exhibiting a general state of irritability). Low self-esteem. Problems Addressed  Unipolar Depression, Anxiety Goals 1. Alleviate depressive symptoms and return to previous level of effective functioning. 2. Appropriately grieve the loss in order to normalize mood and to return to previously adaptive level of functioning. Objective Learn and implement behavioral strategies to overcome depression. Target Date: 2024-04-26 Frequency: Biweekly  Progress: 65 Modality: individual  Related Interventions Assist the client in developing skills that increase the likelihood of deriving pleasure from behavioral activation (e.g., assertiveness skills, developing an exercise plan, less internal/more external focus, increased social involvement); reinforce success. Engage the client in "behavioral activation," increasing his/her activity level and contact with sources of reward, while identifying processes that inhibit activation. use behavioral techniques such as  instruction, rehearsal, role-playing, role reversal, as needed, to facilitate activity in the client's daily life;  reinforce success. 3. Develop healthy interpersonal relationships that lead to the alleviation and help prevent the relapse of depression. 4. Develop healthy thinking patterns and beliefs about self, others, and the world that lead to the alleviation and help prevent the relapse of depression. 5. Enhance ability to effectively cope with the full variety of life's worries and anxieties. 6. Learn and implement coping skills that result in a reduction of anxiety and worry, and improved daily functioning. Objective Learn and implement problem-solving strategies for realistically addressing worries. Target Date: 2024-04-26 Frequency: Biweekly  Progress: 65 Modality: individual  Related Interventions Assign the client a homework exercise in which he/she problem-solves a current problem (see Mastery of Your Anxiety and Worry: Workbook by Elenora Fender and Filbert Schilder or Generalized Anxiety Disorder by Elesa Hacker, and Filbert Schilder); review, reinforce success, and provide corrective feedback toward improvement. Teach the client problem-solving strategies involving specifically defining a problem, generating options for addressing it, evaluating the pros and cons of each option, selecting and implementing an optional action, and reevaluating and refining the action. Objective Learn and implement calming skills to reduce overall anxiety and manage anxiety symptoms. Target Date: 2024-04-26 Frequency: Biweekly  Progress: 65 Modality: individual  Related Interventions Assign the client to read about progressive muscle relaxation and other calming strategies in relevant books or treatment manuals (e.g., Progressive Relaxation Training by Twana First; Mastery of Your Anxiety and Worry: Workbook by Earlie Counts). Assign the client homework each session in which he/she practices relaxation exercises daily, gradually applying them progressively from non-anxiety-provoking to anxiety-provoking situations; review and  reinforce success while providing corrective feedback toward improvement. Teach the client calming/relaxation skills (e.g., applied relaxation, progressive muscle relaxation, cue controlled relaxation; mindful breathing; biofeedback) and how to discriminate better between relaxation and tension; teach the client how to apply these skills to his/her daily life. 7. Recognize, accept, and cope with feelings of depression. 8. Reduce overall frequency, intensity, and duration of the anxiety so that daily functioning is not impaired. 9. Resolve the core conflict that is the source of anxiety. 10. Stabilize anxiety level while increasing ability to function on a daily basis. Diagnosis F43.23  Conditions For Discharge Achievement of treatment goals and objectives   Salomon Fick, LCSW

## 2024-03-14 ENCOUNTER — Ambulatory Visit: Payer: Self-pay | Admitting: Psychology

## 2024-03-14 DIAGNOSIS — F4323 Adjustment disorder with mixed anxiety and depressed mood: Secondary | ICD-10-CM

## 2024-03-14 NOTE — Progress Notes (Signed)
 [facility] Counselor/Therapist Progress Note  Patient ID: Kasper Mudrick, MRN: [id number],    Date: 03/14/2024  Time Spent: 12:00pm-12:55pm   55 minutes   Treatment Type: Individual Therapy  Reported Symptoms: stress  Mental Status Exam: Appearance:  Casual     Behavior: Appropriate  Motor: Normal  Speech/Language:  Normal Rate  Affect: Appropriate  Mood: normal  Thought process: normal  Thought content:   WNL  Sensory/Perceptual disturbances:   WNL  Orientation: oriented to person, place, time/date, and situation  Attention: Good  Concentration: Good  Memory: WNL  Fund of knowledge:  Good  Insight:   Good  Judgment:  Good  Impulse Control: Good   Risk Assessment: Danger to Self:  No Self-injurious Behavior: No Danger to Others: No Duty to Warn:no Physical Aggression / Violence:No  Access to Firearms a concern: No  Gang Involvement:No   Subjective: Pt present for face-to-face individual therapy via video.  Pt consents to telehealth video session and is aware of limitations and benefits of virtual sessions. Location of pt: home Location of therapist: home office.   Pt talked about his trip to see his friend [name] at the car show in [place].  The show was not as good as in the past but pt enjoyed the connection with his friend.   Pt has felt a let down since being home.  Helped pt process his feelings.   Pt states he does not see himself ever being in a romantic relationship again.  This is sad for pt bc he has been lonely lately.   Pt has been feeling more lonely than usual and he is not sure why.  Pt states he has always craved touch and misses touch now.  Addressed how pt is holding a narrative about not having another relationship bc of his "failures" in his past relationships.   Pt talked about work.  He has some interesting clients that he has been impressed with and inspired by.    Worked on self care strategies. Provided supportive therapy.    Interventions: Cognitive Behavioral Therapy and Insight-Oriented  Diagnosis:  F43.23  Plan of Care: Recommend ongoing therapy.  Pt participated in setting treatment goals.  Plan to continue to meet every two weeks.   Treatment Plan Client Abilities/Strengths  Pt is bright, engaging, and motivated for therapy.  Client Treatment Preferences  Individual therapy.  Client Statement of Needs  Improve copings skills and understand herself better. Improve self esteem.  Symptoms  Depressed or irritable mood. Excessive and/or unrealistic worry that is difficult to control occurring more days than not for at least 6 months about a number of events or activities. Hypervigilance (e.g., feeling constantly on edge, experiencing concentration difficulties, having trouble falling or staying asleep, exhibiting a general state of irritability). Low self-esteem. Problems Addressed  Unipolar Depression, Anxiety Goals 1. Alleviate depressive symptoms and return to previous level of effective functioning. 2. Appropriately grieve the loss in order to normalize mood and to return to previously adaptive level of functioning. Objective Learn and implement behavioral strategies to overcome depression. Target Date: 2024-04-26 Frequency: Biweekly  Progress: 65 Modality: individual  Related Interventions Assist the client in developing skills that increase the likelihood of deriving pleasure from behavioral activation (e.g., assertiveness skills, developing an exercise plan, less internal/more external focus, increased social involvement); reinforce success. Engage the client in "behavioral activation," increasing his/her activity level and contact with sources of reward, while identifying processes that inhibit activation. use behavioral techniques such as instruction, rehearsal,  role-playing, role reversal, as needed, to facilitate activity in the client's daily life; reinforce success. 3. Develop healthy  interpersonal relationships that lead to the alleviation and help prevent the relapse of depression. 4. Develop healthy thinking patterns and beliefs about self, others, and the world that lead to the alleviation and help prevent the relapse of depression. 5. Enhance ability to effectively cope with the full variety of life's worries and anxieties. 6. Learn and implement coping skills that result in a reduction of anxiety and worry, and improved daily functioning. Objective Learn and implement problem-solving strategies for realistically addressing worries. Target Date: 2024-04-26 Frequency: Biweekly  Progress: 65 Modality: individual  Related Interventions Assign the client a homework exercise in which he/she problem-solves a current problem (see Mastery of Your Anxiety and Worry: Workbook by Elenora Fender and Filbert Schilder or Generalized Anxiety Disorder by Elesa Hacker, and Filbert Schilder); review, reinforce success, and provide corrective feedback toward improvement. Teach the client problem-solving strategies involving specifically defining a problem, generating options for addressing it, evaluating the pros and cons of each option, selecting and implementing an optional action, and reevaluating and refining the action. Objective Learn and implement calming skills to reduce overall anxiety and manage anxiety symptoms. Target Date: 2024-04-26 Frequency: Biweekly  Progress: 65 Modality: individual  Related Interventions Assign the client to read about progressive muscle relaxation and other calming strategies in relevant books or treatment manuals (e.g., Progressive Relaxation Training by Twana First; Mastery of Your Anxiety and Worry: Workbook by Earlie Counts). Assign the client homework each session in which he/she practices relaxation exercises daily, gradually applying them progressively from non-anxiety-provoking to anxiety-provoking situations; review and reinforce success while providing  corrective feedback toward improvement. Teach the client calming/relaxation skills (e.g., applied relaxation, progressive muscle relaxation, cue controlled relaxation; mindful breathing; biofeedback) and how to discriminate better between relaxation and tension; teach the client how to apply these skills to his/her daily life. 7. Recognize, accept, and cope with feelings of depression. 8. Reduce overall frequency, intensity, and duration of the anxiety so that daily functioning is not impaired. 9. Resolve the core conflict that is the source of anxiety. 10. Stabilize anxiety level while increasing ability to function on a daily basis. Diagnosis F43.23  Conditions For Discharge Achievement of treatment goals and objectives   Salomon Fick, LCSW

## 2024-03-28 ENCOUNTER — Ambulatory Visit: Payer: Self-pay | Admitting: Psychology

## 2024-03-28 DIAGNOSIS — F4323 Adjustment disorder with mixed anxiety and depressed mood: Secondary | ICD-10-CM
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
## 2024-04-11 ENCOUNTER — Ambulatory Visit: Payer: Self-pay | Admitting: Psychology

## 2024-04-11 DIAGNOSIS — F4323 Adjustment disorder with mixed anxiety and depressed mood: Secondary | ICD-10-CM

## 2024-04-11 NOTE — Progress Notes (Signed)
 [facility] Counselor/Therapist Progress Note  Patient ID: Jonathon Middleton, MRN: [id number],    Date: 04/11/2024  Time Spent: 12:00pm-12:55pm   55 minutes   Treatment Type: Individual Therapy  Reported Symptoms: stress  Mental Status Exam: Appearance:  Casual     Behavior: Appropriate  Motor: Normal  Speech/Language:  Normal Rate  Affect: Appropriate  Mood: normal  Thought process: normal  Thought content:   WNL  Sensory/Perceptual disturbances:   WNL  Orientation: oriented to person, place, time/date, and situation  Attention: Good  Concentration: Good  Memory: WNL  Fund of knowledge:  Good  Insight:   Good  Judgment:  Good  Impulse Control: Good   Risk Assessment: Danger to Self:  No Self-injurious Behavior: No Danger to Others: No Duty to Warn:no Physical Aggression / Violence:No  Access to Firearms a concern: No  Gang Involvement:No   Subjective: Pt present for face-to-face individual therapy via video.  Pt consents to telehealth video session and is aware of limitations and benefits of virtual sessions. Location of pt: home Location of therapist: home office.   Pt talked about his oldest son getting engaged.  Pt is happy for his son.  There was a family gathering that was over stimulating for pt.   Pt talked about work.  He talks to [place] about every day.  He states she is good at her job so he is somewhat ok now with having to work with her closely.  He may need to go to a conference with her in [place] in the future.  Pt is proud of himself for how he is handling things.   Pt talked about expectations.   He is learning to adjust expectations which is helping him improve his mood and reactions.   Pt has been reading about stoisism.  He is learning it has to do with perspective and expectations.  Addressed ways pt can adjust his expectations.   Worked on self care strategies. Provided supportive therapy.   Interventions: Cognitive Behavioral  Therapy and Insight-Oriented  Diagnosis:  F43.23  Plan of Care: Recommend ongoing therapy.  Pt participated in setting treatment goals.  Plan to continue to meet every two weeks.   Treatment Plan Client Abilities/Strengths  Pt is bright, engaging, and motivated for therapy.  Client Treatment Preferences  Individual therapy.  Client Statement of Needs  Improve copings skills and understand herself better. Improve self esteem.  Symptoms  Depressed or irritable mood. Excessive and/or unrealistic worry that is difficult to control occurring more days than not for at least 6 months about a number of events or activities. Hypervigilance (e.g., feeling constantly on edge, experiencing concentration difficulties, having trouble falling or staying asleep, exhibiting a general state of irritability). Low self-esteem. Problems Addressed  Unipolar Depression, Anxiety Goals 1. Alleviate depressive symptoms and return to previous level of effective functioning. 2. Appropriately grieve the loss in order to normalize mood and to return to previously adaptive level of functioning. Objective Learn and implement behavioral strategies to overcome depression. Target Date: 2024-04-26 Frequency: Biweekly  Progress: 65 Modality: individual  Related Interventions Assist the client in developing skills that increase the likelihood of deriving pleasure from behavioral activation (e.g., assertiveness skills, developing an exercise plan, less internal/more external focus, increased social involvement); reinforce success. Engage the client in "behavioral activation," increasing his/her activity level and contact with sources of reward, while identifying processes that inhibit activation. use behavioral techniques such as instruction, rehearsal, role-playing, role reversal, as needed, to facilitate activity in  the client's daily life; reinforce success. 3. Develop healthy interpersonal relationships that lead to the  alleviation and help prevent the relapse of depression. 4. Develop healthy thinking patterns and beliefs about self, others, and the world that lead to the alleviation and help prevent the relapse of depression. 5. Enhance ability to effectively cope with the full variety of life's worries and anxieties. 6. Learn and implement coping skills that result in a reduction of anxiety and worry, and improved daily functioning. Objective Learn and implement problem-solving strategies for realistically addressing worries. Target Date: 2024-04-26 Frequency: Biweekly  Progress: 65 Modality: individual  Related Interventions Assign the client a homework exercise in which he/she problem-solves a current problem (see Mastery of Your Anxiety and Worry: Workbook by Colbert Dates and Edna Gouty or Generalized Anxiety Disorder by Woodson He, and Edna Gouty); review, reinforce success, and provide corrective feedback toward improvement. Teach the client problem-solving strategies involving specifically defining a problem, generating options for addressing it, evaluating the pros and cons of each option, selecting and implementing an optional action, and reevaluating and refining the action. Objective Learn and implement calming skills to reduce overall anxiety and manage anxiety symptoms. Target Date: 2024-04-26 Frequency: Biweekly  Progress: 65 Modality: individual  Related Interventions Assign the client to read about progressive muscle relaxation and other calming strategies in relevant books or treatment manuals (e.g., Progressive Relaxation Training by Juleen Oakland; Mastery of Your Anxiety and Worry: Workbook by Rodney Clamp). Assign the client homework each session in which he/she practices relaxation exercises daily, gradually applying them progressively from non-anxiety-provoking to anxiety-provoking situations; review and reinforce success while providing corrective feedback toward improvement. Teach the  client calming/relaxation skills (e.g., applied relaxation, progressive muscle relaxation, cue controlled relaxation; mindful breathing; biofeedback) and how to discriminate better between relaxation and tension; teach the client how to apply these skills to his/her daily life. 7. Recognize, accept, and cope with feelings of depression. 8. Reduce overall frequency, intensity, and duration of the anxiety so that daily functioning is not impaired. 9. Resolve the core conflict that is the source of anxiety. 10. Stabilize anxiety level while increasing ability to function on a daily basis. Diagnosis F43.23  Conditions For Discharge Achievement of treatment goals and objectives   Willey Harrier, LCSW

## 2024-04-25 ENCOUNTER — Ambulatory Visit (INDEPENDENT_AMBULATORY_CARE_PROVIDER_SITE_OTHER): Payer: Self-pay | Admitting: Psychology

## 2024-04-25 DIAGNOSIS — F4323 Adjustment disorder with mixed anxiety and depressed mood: Secondary | ICD-10-CM

## 2024-04-25 NOTE — Progress Notes (Signed)
 [facility] Counselor/Therapist Progress Note  Patient ID: Jonathon Middleton, MRN: [id number],    Date: 04/25/2024  Time Spent: 12:00pm-12:55pm   55 minutes   Treatment Type: Individual Therapy  Reported Symptoms: stress  Mental Status Exam: Appearance:  Casual     Behavior: Appropriate  Motor: Normal  Speech/Language:  Normal Rate  Affect: Appropriate  Mood: normal  Thought process: normal  Thought content:   WNL  Sensory/Perceptual disturbances:   WNL  Orientation: oriented to person, place, time/date, and situation  Attention: Good  Concentration: Good  Memory: WNL  Fund of knowledge:  Good  Insight:   Good  Judgment:  Good  Impulse Control: Good   Risk Assessment: Danger to Self:  No Self-injurious Behavior: No Danger to Others: No Duty to Warn:no Physical Aggression / Violence:No  Access to Firearms a concern: No  Gang Involvement:No   Subjective: Pt present for face-to-face individual therapy via video.  Pt consents to telehealth video session and is aware of limitations and benefits of virtual sessions. Location of pt: home Location of therapist: home office.   Pt talked about his oldest son getting married on July 19th.  The wedding will be at pt's ex-wife's house.  Pt is upset that his son's step father is going to officiate the wedding and pt's grandson is going to be the best man.   Pt feels badly that he will not be in the wedding.  Helped pt process his feelings and family dynamics.  Pt talked about work.  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   Addressed the barriers and helped pt problem solve.   Worked on self care strategies. Provided supportive therapy.   Interventions: Cognitive Behavioral Therapy and Insight-Oriented  Diagnosis:   F43.23  Plan of Care: Recommend ongoing therapy.  Pt participated in setting treatment goals.  Plan to continue to meet every two weeks.   Treatment Plan Client Abilities/Strengths  Pt is bright, engaging, and motivated for therapy.  Client Treatment Preferences  Individual therapy.  Client Statement of Needs  Improve copings skills and understand herself better. Improve self esteem.  Symptoms  Depressed or irritable mood. Excessive and/or unrealistic worry that is difficult to control occurring more days than not for at least 6 months about a number of events or activities. Hypervigilance (e.g., feeling constantly on edge, experiencing concentration difficulties, having trouble falling or staying asleep, exhibiting a general state of irritability). Low self-esteem. Problems Addressed  Unipolar Depression, Anxiety Goals 1. Alleviate depressive symptoms and return to previous level of effective functioning. 2. Appropriately grieve the loss in order to normalize mood and to return to previously adaptive level of functioning. Objective Learn and implement behavioral strategies to overcome depression. Target Date: 2024-04-26 Frequency: Biweekly  Progress: 65 Modality: individual  Related Interventions Assist the client in developing skills that increase the likelihood of deriving pleasure from behavioral activation (e.g., assertiveness skills, developing an exercise plan, less internal/more external focus, increased social involvement); reinforce success. Engage the client in "behavioral activation," increasing his/her activity level and contact with sources of reward, while identifying processes that inhibit activation. use behavioral techniques such as instruction, rehearsal, role-playing, role reversal, as needed, to facilitate activity in the client's daily life; reinforce success. 3. Develop healthy interpersonal relationships that lead  to the alleviation and help prevent the relapse of  depression. 4. Develop healthy thinking patterns and beliefs about self, others, and the world that lead to the alleviation and help prevent the relapse of depression. 5. Enhance ability to effectively cope with the full variety of life's worries and anxieties. 6. Learn and implement coping skills that result in a reduction of anxiety and worry, and improved daily functioning. Objective Learn and implement problem-solving strategies for realistically addressing worries. Target Date: 2024-04-26 Frequency: Biweekly  Progress: 65 Modality: individual  Related Interventions Assign the client a homework exercise in which he/she problem-solves a current problem (see Mastery of Your Anxiety and Worry: Workbook by Colbert Dates and Edna Gouty or Generalized Anxiety Disorder by Woodson He, and Edna Gouty); review, reinforce success, and provide corrective feedback toward improvement. Teach the client problem-solving strategies involving specifically defining a problem, generating options for addressing it, evaluating the pros and cons of each option, selecting and implementing an optional action, and reevaluating and refining the action. Objective Learn and implement calming skills to reduce overall anxiety and manage anxiety symptoms. Target Date: 2024-04-26 Frequency: Biweekly  Progress: 65 Modality: individual  Related Interventions Assign the client to read about progressive muscle relaxation and other calming strategies in relevant books or treatment manuals (e.g., Progressive Relaxation Training by Juleen Oakland; Mastery of Your Anxiety and Worry: Workbook by Rodney Clamp). Assign the client homework each session in which he/she practices relaxation exercises daily, gradually applying them progressively from non-anxiety-provoking to anxiety-provoking situations; review and reinforce success while providing corrective feedback toward improvement. Teach the client calming/relaxation skills (e.g.,  applied relaxation, progressive muscle relaxation, cue controlled relaxation; mindful breathing; biofeedback) and how to discriminate better between relaxation and tension; teach the client how to apply these skills to his/her daily life. 7. Recognize, accept, and cope with feelings of depression. 8. Reduce overall frequency, intensity, and duration of the anxiety so that daily functioning is not impaired. 9. Resolve the core conflict that is the source of anxiety. 10. Stabilize anxiety level while increasing ability to function on a daily basis. Diagnosis F43.23  Conditions For Discharge Achievement of treatment goals and objectives   Willey Harrier, LCSW

## 2024-05-09 ENCOUNTER — Ambulatory Visit (INDEPENDENT_AMBULATORY_CARE_PROVIDER_SITE_OTHER): Payer: Self-pay | Admitting: Psychology

## 2024-05-09 DIAGNOSIS — F4323 Adjustment disorder with mixed anxiety and depressed mood: Secondary | ICD-10-CM

## 2024-05-09 NOTE — Progress Notes (Signed)
 [facility] Counselor Initial Adult Exam  Name: Jonathon Middleton Date: 05/09/2024 MRN: [id number] DOB: [birth year] PCP: Claire Crick, MD  Time spent: 12:00pm-12:55pm   55 minutes  Guardian/Payee:  Jennifer Moellers requested: No   Reason for Visit /Presenting Problem:  Pt present for face-to-face initial assessment update via video.  Pt consents to telehealth video session and is aware of limitations and benefits of virtual sessions. Location of pt: home Location of therapist: home office.  Pt has a lot of work stress.  Addressed pt's work frustrations and worked on [employer], dispensing. Pt has two adult sons who are close to pt's ex-wife.   Pt's son is getting married and pt feels left out of the wedding plans bc the wedding will be at pt's ex-wife's house.   Pt feels hurt and sad.  Helped pt process his feelings and family dynamics.   Pt has issues of negative self talk and low self esteem.  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  Pt has issues with perfectionism and codependency.  Reviewed pt's treatment plan for annual update.  Updated pt's treatment plan and IA.   Pt participated in setting treatment goals.   Pt wants to continue to work on self esteem and his feelings of "not feeling good enough."  Pt wants to work on self doubt and issues of perfectionism.  He wants to work on being more resilient.  Plan to continue to meet every two weeks.     Mental Status Exam: Appearance:   Casual     Behavior:  Appropriate  Motor:  Normal  Speech/Language:   Normal Rate  Affect:  Appropriate  Mood:  normal  Thought process:  normal  Thought content:    WNL  Sensory/Perceptual disturbances:    WNL  Orientation:  oriented to person, place, time/date, and situation  Attention:  Good  Concentration:  Good  Memory:  WNL  Fund of knowledge:   Good  Insight:    Good  Judgment:   Good  Impulse Control:  Good    Reported Symptoms:  stress, loneliness  Risk  Assessment: Danger to Self:  No Self-injurious Behavior: No Danger to Others: No Duty to Warn:no Physical Aggression / Violence:No  Access to Firearms a concern: No  Gang Involvement:No  Patient / guardian was educated about steps to take if suicide or homicide risk level increases between visits: n/a While future psychiatric events cannot be accurately predicted, the patient does not currently require acute inpatient psychiatric care and does not currently meet [facility]  involuntary commitment criteria.  Substance Abuse History: Current substance abuse: No     Past Psychiatric History:   Previous psychological history is significant for depression Outpatient Providers:pt has been in therapy in the past. History of Psych Hospitalization: No  Psychological Testing: n/a   Abuse History:  Victim of: No., n/a   Report needed: No. Victim of Neglect:No. Perpetrator of n/a  Witness / Exposure to Domestic Violence: No   Protective Services Involvement: No  Witness to [employer] Violence:  No   Family History:  Family History  Problem Relation Age of Onset   Hypertension Mother    CAD Father 49       massive MI, deceased   CAD Paternal Uncle 43       MI, smoker   Stroke Paternal Aunt    Diabetes Paternal Grandmother    Cancer Maternal Uncle        lung cancer (non small  cell), smoker    Living situation: the patient lives alone  Pt's father died when he was 55.  Father had massive heart attack and died suddenly at age 65.   Mother never remarried.  Pt was only child.  Pt did not attend funeral.  Pt does not know how he grieved. Pt was very involved in a church that was supportive.  Pt played guitar.   No family history of mental illness.   Sexual Orientation: Straight  Relationship Status: divorced  Name of spouse / other:n/a If a parent, number of children / ages:  pt has 2 adult sons.   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  He has put all his efforts into his job now.  Pt has been at this job for 7 years.    Pt has college degree.   He has worked in Airline pilot.   Military Service: No   Educational History: Education: Risk manager: Protestant  Any cultural differences that may affect / interfere with treatment:  not applicable   Recreation/Hobbies: gardening, reading, fishing  Stressors: Other: job stress, loneliness at times.    Strengths: Supportive Relationships, Family, Hopefulness, Self Advocate, and Able to Communicate Effectively  Barriers:  none   Legal History: Pending legal issue / charges: The patient has no significant history of legal issues. History of legal issue / charges: n/a  Medical History/Surgical History: reviewed Past Medical History:  Diagnosis Date   Arthritis    Atrial fibrillation (HCC)    Depression    Depression with anxiety    GERD (gastroesophageal reflux disease)    08/24/18- not current   History of chicken pox    Humerus fracture    x 2   Hyperlipidemia    Hypertension    Nonalcoholic fatty liver disease    ?h/o elevated LFTs, has not had US    Pre-diabetes    Metobolic Syndrome   Prediabetes    treated with diet & exercise. saw [facility] nutritionist 04/2014    Past Surgical History:  Procedure Laterality Date   COLONOSCOPY     HARDWARE REMOVAL Right 09/21/2017   Procedure: HARDWARE REMOVAL OF RIGHT HUMERUS;  Surgeon: Wes Hamman, MD;  Location: [facility] OR;  Service: Orthopedics;  Laterality: Right;   KNEE SURGERY Right 2009   x 2 (Dr. Aubry Blase)  Arthroscopy- torn MCL   ORIF HUMERUS FRACTURE Right 08/25/2017   Procedure: OPEN REDUCTION INTERNAL FIXATION (ORIF) RIGHT HUMERAL SHAFT FRACTURE;  Surgeon: Wes Hamman, MD;  Location: [facility] OR;  Service: Orthopedics;  Laterality: Right;   ORIF HUMERUS FRACTURE Right 09/21/2017    Procedure: OPEN REDUCTION INTERNAL FIXATION (ORIF) RIGHT HUMERAL SHAFT FRACTURE;  Surgeon: Wes Hamman, MD;  Location: [facility] OR;  Service: Orthopedics;  Laterality: Right;    Medications: Current Outpatient Medications  Medication Sig Dispense Refill   Accu-Chek Softclix Lancets lancets Use as instructed to check blood sugar once daily 100 each 3   apixaban  (ELIQUIS ) 5 MG TABS tablet Take 1 tablet (5 mg total) by mouth 2 (two) times daily. 180 tablet 4   atorvastatin  (LIPITOR) 40 MG tablet Take 1 tablet (40 mg total) by mouth daily. 90 tablet 4   Blood Glucose Monitoring Suppl (ACCU-CHEK GUIDE ME) w/Device KIT Use as instructed to check blood sugar once daily 1 kit 0   Cholecalciferol (VITAMIN D ) 2000 units CAPS Take 2,000 Units  by mouth 2 (two) times daily.      Coenzyme Q10 (CO Q-10) 100 MG CAPS Take 100 mg by mouth daily with breakfast.      diltiazem  (CARDIZEM  CD) 120 MG 24 hr capsule Take 1 capsule (120 mg total) by mouth 2 (two) times daily. TAKE 1 CAPSULE BY MOUTH TWICE DAILY 180 capsule 3   flecainide  (TAMBOCOR ) 100 MG tablet Take 1 tablet (100 mg total) by mouth 2 (two) times daily as needed. Take as needed for atrial fibrillation 180 tablet 3   glucose blood (ACCU-CHEK GUIDE) test strip Use as instructed to check blood sugar once daily 100 each 3   metoprolol  succinate (TOPROL -XL) 50 MG 24 hr tablet Take 1 tablet (50 mg total) by mouth daily. Take with or immediately following a meal. 90 tablet 4   omega-3 fish oil (MAXEPA) 1000 MG CAPS capsule Take 1 capsule by mouth 2 (two) times daily.      sertraline  (ZOLOFT ) 100 MG tablet Take 1 tablet (100 mg total) by mouth daily. 90 tablet 4   TURMERIC PO Take by mouth in the morning and at bedtime.     No current facility-administered medications for this visit.    No Known Allergies  Diagnoses:  F43.23  Plan of Care: Recommend ongoing therapy.  Pt participated in setting treatment goals.  Plan to continue to meet every two weeks. Pt  agrees with treatment plan.   Treatment Plan Client Abilities/Strengths  Pt is bright, engaging, and motivated for therapy.  Client Treatment Preferences  Individual therapy.  Client Statement of Needs  Improve copings skills.  Improve self esteem.  Symptoms  Depressed or irritable mood. Excessive and/or unrealistic worry that is difficult to control occurring more days than not for at least 6 months about a number of events or activities. Hypervigilance (e.g., feeling constantly on edge, experiencing concentration difficulties, having trouble falling or staying asleep, exhibiting a general state of irritability). Low self-esteem. Problems Addressed  Unipolar Depression, Anxiety Goals 1. Alleviate depressive symptoms and return to previous level of effective functioning. 2. Appropriately grieve the loss in order to normalize mood and to return to previously adaptive level of functioning. Objective Learn and implement behavioral strategies to overcome depression. Target Date: 2025-05-09 Frequency: Biweekly  Progress: 65 Modality: individual  Related Interventions Assist the client in developing skills that increase the likelihood of deriving pleasure from behavioral activation (e.g., assertiveness skills, developing an exercise plan, less internal/more external focus, increased social involvement); reinforce success. Engage the client in "behavioral activation," increasing his/her activity level and contact with sources of reward, while identifying processes that inhibit activation. use behavioral techniques such as instruction, rehearsal, role-playing, role reversal, as needed, to facilitate activity in the client's daily life; reinforce success. 3. Develop healthy interpersonal relationships that lead to the alleviation and help prevent the relapse of depression. 4. Develop healthy thinking patterns and beliefs about self, others, and the world that lead to the alleviation and help prevent  the relapse of depression. 5. Enhance ability to effectively cope with the full variety of life's worries and anxieties. 6. Learn and implement coping skills that result in a reduction of anxiety and worry, and improved daily functioning. Objective Learn and implement problem-solving strategies for realistically addressing worries. Target Date: 2025-05-09 Frequency: Biweekly  Progress: 65 Modality: individual  Related Interventions Assign the client a homework exercise in which he/she problem-solves a current problem (see Mastery of Your Anxiety and Worry: Workbook by Colbert Dates and Edna Gouty or Generalized Anxiety Disorder  by Woodson He, and Edna Gouty); review, reinforce success, and provide corrective feedback toward improvement. Teach the client problem-solving strategies involving specifically defining a problem, generating options for addressing it, evaluating the pros and cons of each option, selecting and implementing an optional action, and reevaluating and refining the action. Objective Learn and implement calming skills to reduce overall anxiety and manage anxiety symptoms. Target Date: 2025-05-09 Frequency: Biweekly  Progress: 65 Modality: individual  Related Interventions Assign the client to read about progressive muscle relaxation and other calming strategies in relevant books or treatment manuals (e.g., Progressive Relaxation Training by Juleen Oakland; Mastery of Your Anxiety and Worry: Workbook by Rodney Clamp). Assign the client homework each session in which he/she practices relaxation exercises daily, gradually applying them progressively from non-anxiety-provoking to anxiety-provoking situations; review and reinforce success while providing corrective feedback toward improvement. Teach the client calming/relaxation skills (e.g., applied relaxation, progressive muscle relaxation, cue controlled relaxation; mindful breathing; biofeedback) and how to discriminate better  between relaxation and tension; teach the client how to apply these skills to his/her daily life. 7. Recognize, accept, and cope with feelings of depression. 8. Reduce overall frequency, intensity, and duration of the anxiety so that daily functioning is not impaired. 9. Resolve the core conflict that is the source of anxiety. 10. Stabilize anxiety level while increasing ability to function on a daily basis. Diagnosis F43.23  Conditions For Discharge Achievement of treatment goals and objectives   Willey Harrier, LCSW

## 2024-05-17 LAB — HM DIABETES EYE EXAM

## 2024-05-21 ENCOUNTER — Encounter: Payer: Self-pay | Admitting: Family Medicine

## 2024-05-23 ENCOUNTER — Ambulatory Visit (INDEPENDENT_AMBULATORY_CARE_PROVIDER_SITE_OTHER): Payer: Self-pay | Admitting: Psychology

## 2024-05-23 DIAGNOSIS — F4323 Adjustment disorder with mixed anxiety and depressed mood: Secondary | ICD-10-CM

## 2024-05-23 NOTE — Progress Notes (Signed)
 [facility] Counselor/Therapist Progress Note  Patient ID: Foday Cone, MRN: [id number],    Date: 05/23/2024  Time Spent: 12:00pm-12:55pm   55 minutes   Treatment Type: Individual Therapy  Reported Symptoms: stress  Mental Status Exam: Appearance:  Casual     Behavior: Appropriate  Motor: Normal  Speech/Language:  Normal Rate  Affect: Appropriate  Mood: normal  Thought process: normal  Thought content:   WNL  Sensory/Perceptual disturbances:   WNL  Orientation: oriented to person, place, time/date, and situation  Attention: Good  Concentration: Good  Memory: WNL  Fund of knowledge:  Good  Insight:   Good  Judgment:  Good  Impulse Control: Good   Risk Assessment: Danger to Self:  No Self-injurious Behavior: No Danger to Others: No Duty to Warn:no Physical Aggression / Violence:No  Access to Firearms a concern: No  Gang Involvement:No   Subjective: Pt present for face-to-face individual therapy via video.  Pt consents to telehealth video session and is aware of limitations and benefits of virtual sessions.  Location of pt: home Location of therapist: home office.   Pt talked about work.  He states work is very slow. Pt had dinner with his son and fiance and they had a nice time.   Pt has been reflecting on resilience.    He has been thinking about his childhood and about how much of a worrier his mother was.  He realizes he got his worrying and over thinking from his mother.  Helped pt process family of origin issues and how they impact him now.   Pt talked about his relationship with Nellie Banas.  She calls him a lot and they mostly talk about work but pt worries about things getting more personal.  He does not want to enter into a dating relationship with her again.  Addressed how pt can communicate that to [facility] if she brings up getting back together.    Provided supportive therapy.   Interventions: Cognitive Behavioral Therapy and  Insight-Oriented  Diagnosis:  F43.23  Plan of Care: Recommend ongoing therapy.  Pt participated in setting treatment goals.  Plan to continue to meet every two weeks. Pt agrees with treatment plan.   Treatment Plan Client Abilities/Strengths  Pt is bright, engaging, and motivated for therapy.  Client Treatment Preferences  Individual therapy.  Client Statement of Needs  Improve copings skills.  Improve self esteem.  Symptoms  Depressed or irritable mood. Excessive and/or unrealistic worry that is difficult to control occurring more days than not for at least 6 months about a number of events or activities. Hypervigilance (e.g., feeling constantly on edge, experiencing concentration difficulties, having trouble falling or staying asleep, exhibiting a general state of irritability). Low self-esteem. Problems Addressed  Unipolar Depression, Anxiety Goals 1. Alleviate depressive symptoms and return to previous level of effective functioning. 2. Appropriately grieve the loss in order to normalize mood and to return to previously adaptive level of functioning. Objective Learn and implement behavioral strategies to overcome depression. Target Date: 2025-05-09 Frequency: Biweekly  Progress: 65 Modality: individual  Related Interventions Assist the client in developing skills that increase the likelihood of deriving pleasure from behavioral activation (e.g., assertiveness skills, developing an exercise plan, less internal/more external focus, increased social involvement); reinforce success. Engage the client in behavioral activation, increasing his/her activity level and contact with sources of reward, while identifying processes that inhibit activation. use behavioral techniques such as instruction, rehearsal, role-playing, role reversal, as needed, to facilitate activity in the client's daily life;  reinforce success. 3. Develop healthy interpersonal relationships that lead to the alleviation  and help prevent the relapse of depression. 4. Develop healthy thinking patterns and beliefs about self, others, and the world that lead to the alleviation and help prevent the relapse of depression. 5. Enhance ability to effectively cope with the full variety of life's worries and anxieties. 6. Learn and implement coping skills that result in a reduction of anxiety and worry, and improved daily functioning. Objective Learn and implement problem-solving strategies for realistically addressing worries. Target Date: 2025-05-09 Frequency: Biweekly  Progress: 65 Modality: individual  Related Interventions Assign the client a homework exercise in which he/she problem-solves a current problem (see Mastery of Your Anxiety and Worry: Workbook by Colbert Dates and Edna Gouty or Generalized Anxiety Disorder by Woodson He, and Edna Gouty); review, reinforce success, and provide corrective feedback toward improvement. Teach the client problem-solving strategies involving specifically defining a problem, generating options for addressing it, evaluating the pros and cons of each option, selecting and implementing an optional action, and reevaluating and refining the action. Objective Learn and implement calming skills to reduce overall anxiety and manage anxiety symptoms. Target Date: 2025-05-09 Frequency: Biweekly  Progress: 65 Modality: individual  Related Interventions Assign the client to read about progressive muscle relaxation and other calming strategies in relevant books or treatment manuals (e.g., Progressive Relaxation Training by Juleen Oakland; Mastery of Your Anxiety and Worry: Workbook by Rodney Clamp). Assign the client homework each session in which he/she practices relaxation exercises daily, gradually applying them progressively from non-anxiety-provoking to anxiety-provoking situations; review and reinforce success while providing corrective feedback toward improvement. Teach the client  calming/relaxation skills (e.g., applied relaxation, progressive muscle relaxation, cue controlled relaxation; mindful breathing; biofeedback) and how to discriminate better between relaxation and tension; teach the client how to apply these skills to his/her daily life. 7. Recognize, accept, and cope with feelings of depression. 8. Reduce overall frequency, intensity, and duration of the anxiety so that daily functioning is not impaired. 9. Resolve the core conflict that is the source of anxiety. 10. Stabilize anxiety level while increasing ability to function on a daily basis. Diagnosis F43.23  Conditions For Discharge Achievement of treatment goals and objectives   Willey Harrier, LCSW

## 2024-06-06 ENCOUNTER — Ambulatory Visit (INDEPENDENT_AMBULATORY_CARE_PROVIDER_SITE_OTHER): Payer: Self-pay | Admitting: Psychology

## 2024-06-06 DIAGNOSIS — F4323 Adjustment disorder with mixed anxiety and depressed mood: Secondary | ICD-10-CM

## 2024-06-06 NOTE — Progress Notes (Signed)
 [facility] Counselor/Therapist Progress Note  Patient ID: Jonathon Middleton, MRN: [id number],    Date: 06/06/2024  Time Spent: 12:00pm-12:55pm   55 minutes   Treatment Type: Individual Therapy  Reported Symptoms: stress  Mental Status Exam: Appearance:  Casual     Behavior: Appropriate  Motor: Normal  Speech/Language:  Normal Rate  Affect: Appropriate  Mood: normal  Thought process: normal  Thought content:   WNL  Sensory/Perceptual disturbances:   WNL  Orientation: oriented to person, place, time/date, and situation  Attention: Good  Concentration: Good  Memory: WNL  Fund of knowledge:  Good  Insight:   Good  Judgment:  Good  Impulse Control: Good   Risk Assessment: Danger to Self:  No Self-injurious Behavior: No Danger to Others: No Duty to Warn:no Physical Aggression / Violence:No  Access to Firearms a concern: No  Gang Involvement:No   Subjective: Pt present for face-to-face individual therapy via video.  Pt consents to telehealth video session and is aware of limitations and benefits of virtual sessions.  Location of pt: home Location of therapist: home office.   Pt talked about work.  He takes very little time off.   He states he has never taken much vacation and knows he needs to make some changes regarding this.  Addressed how pt can take better care of himself by scheduling breaks.   Pt talked about feeling hurt that he has not been included in his son's wedding.    Pt is feeling badly that he will go to the wedding alone.  Helped pt process his feelings.  Pt has identified that he has fears about being replaced.  Helped pt process these issues. Pt talked about the challenges of this phase of life being 50 and facing aging issues and his mortality.   Provided supportive therapy.   Interventions: Cognitive Behavioral Therapy and Insight-Oriented  Diagnosis:  F43.23  Plan of Care: Recommend ongoing therapy.  Pt participated in setting treatment  goals.  Plan to continue to meet every two weeks. Pt agrees with treatment plan.   Treatment Plan Client Abilities/Strengths  Pt is bright, engaging, and motivated for therapy.  Client Treatment Preferences  Individual therapy.  Client Statement of Needs  Improve copings skills.  Improve self esteem.  Symptoms  Depressed or irritable mood. Excessive and/or unrealistic worry that is difficult to control occurring more days than not for at least 6 months about a number of events or activities. Hypervigilance (e.g., feeling constantly on edge, experiencing concentration difficulties, having trouble falling or staying asleep, exhibiting a general state of irritability). Low self-esteem. Problems Addressed  Unipolar Depression, Anxiety Goals 1. Alleviate depressive symptoms and return to previous level of effective functioning. 2. Appropriately grieve the loss in order to normalize mood and to return to previously adaptive level of functioning. Objective Learn and implement behavioral strategies to overcome depression. Target Date: 2025-05-09 Frequency: Biweekly  Progress: 65 Modality: individual  Related Interventions Assist the client in developing skills that increase the likelihood of deriving pleasure from behavioral activation (e.g., assertiveness skills, developing an exercise plan, less internal/more external focus, increased social involvement); reinforce success. Engage the client in behavioral activation, increasing his/her activity level and contact with sources of reward, while identifying processes that inhibit activation. use behavioral techniques such as instruction, rehearsal, role-playing, role reversal, as needed, to facilitate activity in the client's daily life; reinforce success. 3. Develop healthy interpersonal relationships that lead to the alleviation and help prevent the relapse of depression. 4. Develop healthy  thinking patterns and beliefs about self, others, and the  world that lead to the alleviation and help prevent the relapse of depression. 5. Enhance ability to effectively cope with the full variety of life's worries and anxieties. 6. Learn and implement coping skills that result in a reduction of anxiety and worry, and improved daily functioning. Objective Learn and implement problem-solving strategies for realistically addressing worries. Target Date: 2025-05-09 Frequency: Biweekly  Progress: 65 Modality: individual  Related Interventions Assign the client a homework exercise in which he/she problem-solves a current problem (see Mastery of Your Anxiety and Worry: Workbook by Richarda and Jonne or Generalized Anxiety Disorder by Delores Filler, and Jonne); review, reinforce success, and provide corrective feedback toward improvement. Teach the client problem-solving strategies involving specifically defining a problem, generating options for addressing it, evaluating the pros and cons of each option, selecting and implementing an optional action, and reevaluating and refining the action. Objective Learn and implement calming skills to reduce overall anxiety and manage anxiety symptoms. Target Date: 2025-05-09 Frequency: Biweekly  Progress: 65 Modality: individual  Related Interventions Assign the client to read about progressive muscle relaxation and other calming strategies in relevant books or treatment manuals (e.g., Progressive Relaxation Training by Thornell armin Collier; Mastery of Your Anxiety and Worry: Workbook by Richarda armin Jonne). Assign the client homework each session in which he/she practices relaxation exercises daily, gradually applying them progressively from non-anxiety-provoking to anxiety-provoking situations; review and reinforce success while providing corrective feedback toward improvement. Teach the client calming/relaxation skills (e.g., applied relaxation, progressive muscle relaxation, cue controlled relaxation; mindful  breathing; biofeedback) and how to discriminate better between relaxation and tension; teach the client how to apply these skills to his/her daily life. 7. Recognize, accept, and cope with feelings of depression. 8. Reduce overall frequency, intensity, and duration of the anxiety so that daily functioning is not impaired. 9. Resolve the core conflict that is the source of anxiety. 10. Stabilize anxiety level while increasing ability to function on a daily basis. Diagnosis F43.23  Conditions For Discharge Achievement of treatment goals and objectives   Veva Alma, LCSW

## 2024-06-18 ENCOUNTER — Other Ambulatory Visit: Payer: Self-pay | Admitting: Family Medicine

## 2024-06-18 DIAGNOSIS — K760 Fatty (change of) liver, not elsewhere classified: Secondary | ICD-10-CM

## 2024-06-18 DIAGNOSIS — I480 Paroxysmal atrial fibrillation: Secondary | ICD-10-CM

## 2024-06-18 DIAGNOSIS — R972 Elevated prostate specific antigen [PSA]: Secondary | ICD-10-CM

## 2024-06-18 DIAGNOSIS — E1169 Type 2 diabetes mellitus with other specified complication: Secondary | ICD-10-CM

## 2024-06-20 ENCOUNTER — Ambulatory Visit: Payer: Self-pay | Admitting: Psychology

## 2024-06-21 ENCOUNTER — Other Ambulatory Visit: Payer: Medicare Other

## 2024-06-24 ENCOUNTER — Other Ambulatory Visit (INDEPENDENT_AMBULATORY_CARE_PROVIDER_SITE_OTHER): Payer: Self-pay

## 2024-06-24 ENCOUNTER — Ambulatory Visit: Payer: Self-pay | Admitting: Family Medicine

## 2024-06-24 DIAGNOSIS — E1169 Type 2 diabetes mellitus with other specified complication: Secondary | ICD-10-CM

## 2024-06-24 DIAGNOSIS — K760 Fatty (change of) liver, not elsewhere classified: Secondary | ICD-10-CM

## 2024-06-24 DIAGNOSIS — E785 Hyperlipidemia, unspecified: Secondary | ICD-10-CM

## 2024-06-24 DIAGNOSIS — I480 Paroxysmal atrial fibrillation: Secondary | ICD-10-CM

## 2024-06-24 DIAGNOSIS — R972 Elevated prostate specific antigen [PSA]: Secondary | ICD-10-CM

## 2024-06-24 LAB — LIPID PANEL
Cholesterol: 161 mg/dL (ref 0–200)
HDL: 40 mg/dL (ref >39.00)
LDL Cholesterol: 93 mg/dL (ref 0–99)
NonHDL: 121.14
Total CHOL/HDL Ratio: 4
Triglycerides: 143 mg/dL (ref 0.0–149.0)
VLDL: 28.6 mg/dL (ref 0.0–40.0)

## 2024-06-24 LAB — COMPREHENSIVE METABOLIC PANEL WITH GFR
ALT: 22 U/L (ref 0–53)
AST: 19 U/L (ref 0–37)
Albumin: 4.3 g/dL (ref 3.5–5.2)
Alkaline Phosphatase: 93 U/L (ref 39–117)
BUN: 22 mg/dL (ref 6–23)
CO2: 30 meq/L (ref 19–32)
Calcium: 9.1 mg/dL (ref 8.4–10.5)
Chloride: 101 meq/L (ref 96–112)
Creatinine, Ser: 1.02 mg/dL (ref 0.40–1.50)
GFR: 73.83 mL/min (ref >60.00)
Glucose, Bld: 114 mg/dL — ABNORMAL HIGH (ref 70–99)
Potassium: 4.4 meq/L (ref 3.5–5.1)
Sodium: 140 meq/L (ref 135–145)
Total Bilirubin: 0.9 mg/dL (ref 0.2–1.2)
Total Protein: 6.3 g/dL (ref 6.0–8.3)

## 2024-06-24 LAB — CBC WITH DIFFERENTIAL/PLATELET
Basophils Absolute: 0 K/uL (ref 0.0–0.1)
Basophils Relative: 0.5 % (ref 0.0–3.0)
Eosinophils Absolute: 0.2 K/uL (ref 0.0–0.7)
Eosinophils Relative: 2.6 % (ref 0.0–5.0)
HCT: 46.2 % (ref 39.0–52.0)
Hemoglobin: 15.2 g/dL (ref 13.0–17.0)
Lymphocytes Relative: 24.5 % (ref 12.0–46.0)
Lymphs Abs: 1.4 K/uL (ref 0.7–4.0)
MCHC: 33 g/dL (ref 30.0–36.0)
MCV: 88.8 fl (ref 78.0–100.0)
Monocytes Absolute: 0.6 K/uL (ref 0.1–1.0)
Monocytes Relative: 10.1 % (ref 3.0–12.0)
Neutro Abs: 3.6 K/uL (ref 1.4–7.7)
Neutrophils Relative %: 62.3 % (ref 43.0–77.0)
Platelets: 203 K/uL (ref 150.0–400.0)
RBC: 5.2 Mil/uL (ref 4.22–5.81)
RDW: 14.2 % (ref 11.5–15.5)
WBC: 5.8 K/uL (ref 4.0–10.5)

## 2024-06-24 LAB — VITAMIN B12: Vitamin B-12: 386 pg/mL (ref 211–911)

## 2024-06-24 LAB — TSH: TSH: 3.92 u[IU]/mL (ref 0.35–5.50)

## 2024-06-24 LAB — PSA: PSA: 2.52 ng/mL (ref 0.10–4.00)

## 2024-06-24 LAB — HEMOGLOBIN A1C: Hgb A1c MFr Bld: 6.6 % — ABNORMAL HIGH (ref 4.6–6.5)

## 2024-06-25 LAB — MICROALBUMIN / CREATININE URINE RATIO
Creatinine,U: 131.1 mg/dL
Microalb Creat Ratio: 25.7 mg/g (ref 0.0–30.0)
Microalb, Ur: 3.4 mg/dL — ABNORMAL HIGH (ref 0.0–1.9)

## 2024-06-28 ENCOUNTER — Encounter: Payer: Self-pay | Admitting: Family Medicine

## 2024-06-28 ENCOUNTER — Ambulatory Visit: Payer: Medicare Other | Admitting: Family Medicine

## 2024-06-28 VITALS — BP 136/82 | HR 78 | Temp 98.6°F | Ht 69.0 in | Wt 213.0 lb

## 2024-06-28 DIAGNOSIS — K760 Fatty (change of) liver, not elsewhere classified: Secondary | ICD-10-CM

## 2024-06-28 DIAGNOSIS — B001 Herpesviral vesicular dermatitis: Secondary | ICD-10-CM

## 2024-06-28 DIAGNOSIS — Z7189 Other specified counseling: Secondary | ICD-10-CM

## 2024-06-28 DIAGNOSIS — E1169 Type 2 diabetes mellitus with other specified complication: Secondary | ICD-10-CM

## 2024-06-28 DIAGNOSIS — I10 Essential (primary) hypertension: Secondary | ICD-10-CM

## 2024-06-28 DIAGNOSIS — I77810 Thoracic aortic ectasia: Secondary | ICD-10-CM

## 2024-06-28 DIAGNOSIS — E785 Hyperlipidemia, unspecified: Secondary | ICD-10-CM

## 2024-06-28 DIAGNOSIS — E66811 Obesity, class 1: Secondary | ICD-10-CM

## 2024-06-28 DIAGNOSIS — I480 Paroxysmal atrial fibrillation: Secondary | ICD-10-CM

## 2024-06-28 DIAGNOSIS — F331 Major depressive disorder, recurrent, moderate: Secondary | ICD-10-CM

## 2024-06-28 DIAGNOSIS — K219 Gastro-esophageal reflux disease without esophagitis: Secondary | ICD-10-CM

## 2024-06-28 DIAGNOSIS — Z1211 Encounter for screening for malignant neoplasm of colon: Secondary | ICD-10-CM

## 2024-06-28 DIAGNOSIS — Z0000 Encounter for general adult medical examination without abnormal findings: Secondary | ICD-10-CM

## 2024-06-28 MED ORDER — ATORVASTATIN CALCIUM 40 MG PO TABS: 40.0000 mg | ORAL_TABLET | Freq: Every day | ORAL | 4 refills | Status: AC

## 2024-06-28 MED ORDER — VALACYCLOVIR HCL 1 G PO TABS: 2000.0000 mg | ORAL_TABLET | Freq: Two times a day (BID) | ORAL | 0 refills | Status: AC

## 2024-06-28 MED ORDER — APIXABAN 5 MG PO TABS: 5.0000 mg | ORAL_TABLET | Freq: Two times a day (BID) | ORAL | 4 refills | Status: AC

## 2024-06-28 MED ORDER — METOPROLOL SUCCINATE ER 50 MG PO TB24: 50.0000 mg | ORAL_TABLET | Freq: Every day | ORAL | 4 refills | Status: AC

## 2024-06-28 MED ORDER — SERTRALINE HCL 100 MG PO TABS
100.0000 mg | ORAL_TABLET | Freq: Every day | ORAL | 4 refills | Status: AC
Start: 2024-06-28 — End: ?

## 2024-06-28 NOTE — Assessment & Plan Note (Addendum)
 Advanced directive discussion: has living will at home, does want CPR but doesn't want prolonged life support if terminal condition. Would want son Jonathon Middleton to be HCPOA. Packet provided today.

## 2024-06-28 NOTE — Progress Notes (Signed)
 Ph: [phone] Fax: [phone]   Patient ID: Jonathon Middleton, male    DOB: 1951/04/26, 73 y.o.   MRN: [id number]  This visit was conducted in person.  BP 136/82   Pulse 78   Temp 98.6 F (37 C) (Oral)   Ht 5' 9 (1.753 m)   Wt 213 lb (96.6 kg)   SpO2 95%   BMI 31.45 kg/m    CC: CPE/AMW Subjective:   HPI: Jonathon Middleton is a 73 y.o. male presenting on 06/28/2024 for Medicare Wellness   Did not see health advisor. Does have medicare A/B.   No results found.  Flowsheet Row Office Visit from 06/28/2024 in [facility] [place]  PHQ-2 Total Score 0       06/28/2024    8:23 AM 12/29/2023    8:16 AM 06/28/2023    8:02 AM 12/28/2022    8:09 AM 06/27/2022    8:21 AM  Fall Risk   Falls in the past year? 0 0 0 1 0  Number falls in past yr: 0   0   Injury with Fall? 0   0   Risk for fall due to : No Fall Risks      Follow up Falls evaluation completed        Oldest son gon married last weekend!  New fever blister - manages with lycine and abreva with limited benefit.  Request trial oral valtrex for this.   Parox afib sees cards on dilt, eliquis , toprol  XL, flecainide  bid.   Dilated thoracic aorta - 4.4cm by cardiac CT 10/2023, rec yearly monitoring.   Preventative: Colonoscopy - 2008 WNL, rpt 10 yrs ([place]). Cologuard negative 2022 - rpt ordered today.  Prostate cancer screening - yearly PSA. Nocturia x1. Strong stream.  Lung cancer screening - not eligible  Flu shot yearly  COVID vaccine - Moderna 02/2020, 03/2020, booster 12/2020, Pfizer bivalent 09/2021 Pneumovax 10/2013, 10/2020, prevnar-13 10/2019 Tdap - 04/2014 - planning to return next week for rpt  zostavax 10/2013 RSV - discussed Shingrix - to return for this Advanced directive discussion: has living will at home, does want CPR but doesn't want prolonged life support if terminal condition. Would want son Jonathon Middleton to be HCPOA. Packet provided today. .  Seat belt use  discussed Sunscreen use discussed, no changing moles.  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 Interim medical history since our last visit reviewed. Allergies and medications reviewed and updated. Outpatient Medications Prior to Visit  Medication Sig Dispense Refill   Accu-Chek Softclix Lancets lancets Use as instructed to check blood sugar once daily 100 each 3   Blood Glucose Monitoring Suppl (ACCU-CHEK GUIDE ME) w/Device KIT Use as instructed to check blood sugar once daily 1 kit 0   Cholecalciferol (VITAMIN D ) 2000 units CAPS Take 2,000 Units by mouth 2 (two) times daily.      Coenzyme Q10 (CO Q-10) 100 MG CAPS Take 100 mg by mouth daily with breakfast.  diltiazem  (CARDIZEM  CD) 120 MG 24 hr capsule Take 1 capsule (120 mg total) by mouth 2 (two) times daily. TAKE 1 CAPSULE BY MOUTH TWICE DAILY 180 capsule 3   flecainide  (TAMBOCOR ) 100 MG tablet Take 1 tablet (100 mg total) by mouth 2 (two) times daily as needed. Take as needed for atrial fibrillation 180 tablet 3   glucose blood (ACCU-CHEK GUIDE) test strip Use as instructed to check blood sugar once daily 100 each 3   omega-3 fish oil (MAXEPA) 1000 MG CAPS capsule Take 1 capsule by mouth 2 (two) times daily.      TURMERIC PO Take by mouth in the morning and at bedtime.     apixaban  (ELIQUIS ) 5 MG TABS tablet Take 1 tablet (5 mg total) by mouth 2 (two) times daily. 180 tablet 4   atorvastatin  (LIPITOR) 40 MG tablet Take 1 tablet (40 mg  total) by mouth daily. 90 tablet 4   metoprolol  succinate (TOPROL -XL) 50 MG 24 hr tablet Take 1 tablet (50 mg total) by mouth daily. Take with or immediately following a meal. 90 tablet 4   sertraline  (ZOLOFT ) 100 MG tablet Take 1 tablet (100 mg total) by mouth daily. 90 tablet 4   No facility-administered medications prior to visit.     Per HPI unless specifically indicated in ROS section below Review of Systems  Constitutional:  Negative for activity change, appetite change, chills, fatigue, fever and unexpected weight change.  HENT:  Negative for hearing loss.   Eyes:  Negative for visual disturbance.  Respiratory:  Negative for cough, chest tightness, shortness of breath and wheezing.   Cardiovascular:  Negative for chest pain, palpitations and leg swelling.  Gastrointestinal:  Negative for abdominal distention, abdominal pain, blood in stool, constipation, diarrhea, nausea and vomiting.  Genitourinary:  Negative for difficulty urinating and hematuria.  Musculoskeletal:  Negative for arthralgias, myalgias and neck pain.  Skin:  Negative for rash.  Neurological:  Negative for dizziness, seizures, syncope and headaches.  Hematological:  Negative for adenopathy. Does not bruise/bleed easily.  Psychiatric/Behavioral:  Negative for dysphoric mood. The patient is not nervous/anxious.     Objective:  BP 136/82   Pulse 78   Temp 98.6 F (37 C) (Oral)   Ht 5' 9 (1.753 m)   Wt 213 lb (96.6 kg)   SpO2 95%   BMI 31.45 kg/m   Wt Readings from Last 3 Encounters:  06/28/24 213 lb (96.6 kg)  12/29/23 213 lb (96.6 kg)  10/23/23 210 lb 2 oz (95.3 kg)      Physical Exam Vitals and nursing note reviewed.  Constitutional:      General: He is not in acute distress.    Appearance: Normal appearance. He is well-developed. He is not ill-appearing.  HENT:     Head: Normocephalic and atraumatic.     Right Ear: Hearing, tympanic membrane, ear canal and external ear normal.     Left Ear:  Hearing, tympanic membrane, ear canal and external ear normal.     Mouth/Throat:     Mouth: Mucous membranes are moist.     Pharynx: Oropharynx is clear. No oropharyngeal exudate or posterior oropharyngeal erythema.      Comments: Fever blister to R lower lip  Eyes:     General: No scleral icterus.    Extraocular Movements: Extraocular movements intact.     Conjunctiva/sclera: Conjunctivae normal.     Pupils: Pupils are equal, round, and reactive to light.  Neck:     Thyroid : No  thyroid  mass or thyromegaly.  Cardiovascular:     Rate and Rhythm: Normal rate and regular rhythm.     Pulses: Normal pulses.          Radial pulses are 2+ on the right side and 2+ on the left side.     Heart sounds: Normal heart sounds. No murmur heard. Pulmonary:     Effort: Pulmonary effort is normal. No respiratory distress.     Breath sounds: Normal breath sounds. No wheezing, rhonchi or rales.  Abdominal:     General: Bowel sounds are normal. There is no distension.     Palpations: Abdomen is soft. There is no mass.     Tenderness: There is no abdominal tenderness. There is no guarding or rebound.     Hernia: No hernia is present.  Musculoskeletal:        General: Normal range of motion.     Cervical back: Normal range of motion and neck supple.     Right lower leg: No edema.     Left lower leg: No edema.  Lymphadenopathy:     Cervical: No cervical adenopathy.  Skin:    General: Skin is warm and dry.     Findings: No rash.  Neurological:     General: No focal deficit present.     Mental Status: He is alert and oriented to person, place, and time.     Comments:  Recall 3/3 Calculation 5/5 DLROW  Psychiatric:        Mood and Affect: Mood normal.        Behavior: Behavior normal.        Thought Content: Thought content normal.        Judgment: Judgment normal.       Results for orders placed or performed in visit on 06/24/24  TSH   Collection Time: 06/24/24  8:28 AM  Result Value Ref  Range   TSH 3.92 0.35 - 5.50 uIU/mL  Vitamin B12   Collection Time: 06/24/24  8:28 AM  Result Value Ref Range   Vitamin B-12 386 211 - 911 pg/mL  PSA   Collection Time: 06/24/24  8:28 AM  Result Value Ref Range   PSA 2.52 0.10 - 4.00 ng/mL  CBC with Differential/Platelet   Collection Time: 06/24/24  8:28 AM  Result Value Ref Range   WBC 5.8 4.0 - 10.5 K/uL   RBC 5.20 4.22 - 5.81 Mil/uL   Hemoglobin 15.2 13.0 - 17.0 g/dL   HCT 53.7 60.9 - 47.9 %   MCV 88.8 78.0 - 100.0 fl   MCHC 33.0 30.0 - 36.0 g/dL   RDW 85.7 88.4 - 84.4 %   Platelets 203.0 150.0 - 400.0 K/uL   Neutrophils Relative % 62.3 43.0 - 77.0 %   Lymphocytes Relative 24.5 12.0 - 46.0 %   Monocytes Relative 10.1 3.0 - 12.0 %   Eosinophils Relative 2.6 0.0 - 5.0 %   Basophils Relative 0.5 0.0 - 3.0 %   Neutro Abs 3.6 1.4 - 7.7 K/uL   Lymphs Abs 1.4 0.7 - 4.0 K/uL   Monocytes Absolute 0.6 0.1 - 1.0 K/uL   Eosinophils Absolute 0.2 0.0 - 0.7 K/uL   Basophils Absolute 0.0 0.0 - 0.1 K/uL  Comprehensive metabolic panel with GFR   Collection Time: 06/24/24  8:28 AM  Result Value Ref Range   Sodium 140 135 - 145 mEq/L   Potassium 4.4 3.5 - 5.1 mEq/L   Chloride 101 96 - 112 mEq/L  CO2 30 19 - 32 mEq/L   Glucose, Bld 114 (H) 70 - 99 mg/dL   BUN 22 6 - 23 mg/dL   Creatinine, Ser 8.97 0.40 - 1.50 mg/dL   Total Bilirubin 0.9 0.2 - 1.2 mg/dL   Alkaline Phosphatase 93 39 - 117 U/L   AST 19 0 - 37 U/L   ALT 22 0 - 53 U/L   Total Protein 6.3 6.0 - 8.3 g/dL   Albumin 4.3 3.5 - 5.2 g/dL   GFR 26.16 >39.99 mL/min   Calcium  9.1 8.4 - 10.5 mg/dL  Lipid panel   Collection Time: 06/24/24  8:28 AM  Result Value Ref Range   Cholesterol 161 0 - 200 mg/dL   Triglycerides 856.9 0.0 - 149.0 mg/dL   HDL 59.99 >60.99 mg/dL   VLDL 71.3 0.0 - 59.9 mg/dL   LDL Cholesterol 93 0 - 99 mg/dL   Total CHOL/HDL Ratio 4    NonHDL 121.14   Microalbumin / creatinine urine ratio   Collection Time: 06/24/24  8:28 AM  Result Value Ref Range    Microalb, Ur 3.4 (H) 0.0 - 1.9 mg/dL   Creatinine,U 868.8 mg/dL   Microalb Creat Ratio 25.7 0.0 - 30.0 mg/g  Hemoglobin A1c   Collection Time: 06/24/24  8:28 AM  Result Value Ref Range   Hgb A1c MFr Bld 6.6 (H) 4.6 - 6.5 %    Assessment & Plan:   Problem List Items Addressed This Visit     Health maintenance examination (Chronic)   Preventative protocols reviewed and updated unless pt declined. Discussed healthy diet and lifestyle.       Advanced directives, counseling/discussion (Chronic)   Advanced directive discussion: has living will at home, does want CPR but doesn't want prolonged life support if terminal condition. Would want son Jonathon C Deshmukh to be HCPOA. Packet provided today.       Medicare annual wellness visit, subsequent - Primary (Chronic)   I have personally reviewed the Medicare Annual Wellness questionnaire and have noted 1. The patient's medical and social history 2. Their use of alcohol, tobacco or illicit drugs 3. Their current medications and supplements 4. The patient's functional ability including ADL's, fall risks, home safety risks and hearing or visual impairment. Cognitive function has been assessed and addressed as indicated.  5. Diet and physical activity 6. Evidence for depression or mood disorders The patients weight, height, BMI have been recorded in the chart. I have made referrals, counseling and provided education to the patient based on review of the above and I have provided the pt with a written personalized care plan for preventive services. Provider list updated.. See scanned questionairre as needed for further documentation. Reviewed preventative protocols and updated unless pt declined.       Paroxysmal atrial fibrillation (HCC)   Chronic, stable on current meds, followed by cards.       Relevant Medications   apixaban  (ELIQUIS ) 5 MG TABS tablet   atorvastatin  (LIPITOR) 40 MG tablet   metoprolol  succinate (TOPROL -XL) 50 MG 24  hr tablet   Hyperlipidemia associated with type 2 diabetes mellitus (HCC)   Chronic, continues atorva 40mg . LDL above goal, <70, ideally <55.  Reviewed diet choices to improve cholesterol levels. He will also schedule cardiology f/u.  The 10-year ASCVD risk score (Arnett DK, et al., 2019) is: 41.6%   Values used to calculate the score:     Age: 41 years     Clincally relevant sex: Male  Is Non-Hispanic African American: No     Diabetic: Yes     Tobacco smoker: No     Systolic Blood Pressure: 136 mmHg     Is BP treated: Yes     HDL Cholesterol: 40 mg/dL     Total Cholesterol: 161 mg/dL       Relevant Medications   apixaban  (ELIQUIS ) 5 MG TABS tablet   atorvastatin  (LIPITOR) 40 MG tablet   metoprolol  succinate (TOPROL -XL) 50 MG 24 hr tablet   Essential hypertension   Chronic, stable. Continue current regimen.       Relevant Medications   apixaban  (ELIQUIS ) 5 MG TABS tablet   atorvastatin  (LIPITOR) 40 MG tablet   metoprolol  succinate (TOPROL -XL) 50 MG 24 hr tablet   MDD (major depressive disorder), recurrent episode (HCC)   Chronic, stable period on sertraline . Also sees counselor.       Relevant Medications   sertraline  (ZOLOFT ) 100 MG tablet   Type 2 diabetes mellitus with other specified complication (HCC)   Chronic, stable, diet controlled.       Relevant Medications   atorvastatin  (LIPITOR) 40 MG tablet   Metabolic dysfunction-associated fatty liver disease (MAFLD)   LFTs remain normal.       GERD (gastroesophageal reflux disease)   Chronic, stable off PPI      Obesity, Class I, BMI 30-34.9   Continue to encourage healthy diet and lifestyle choices to affect sustainable weight loss.       Dilation of thoracic aorta (HCC)   Rec yearly imaging, next due 10/2024 - planning to see cardiology      Relevant Medications   apixaban  (ELIQUIS ) 5 MG TABS tablet   atorvastatin  (LIPITOR) 40 MG tablet   metoprolol  succinate (TOPROL -XL) 50 MG 24 hr tablet   Herpes  labialis   Rx valtrex oral to use PRN fever blister      Relevant Medications   valACYclovir (VALTREX) 1000 MG tablet   Other Visit Diagnoses       Special screening for malignant neoplasms, colon       Relevant Orders   Cologuard        Meds ordered this encounter  Medications   apixaban  (ELIQUIS ) 5 MG TABS tablet    Sig: Take 1 tablet (5 mg total) by mouth 2 (two) times daily.    Dispense:  180 tablet    Refill:  4   atorvastatin  (LIPITOR) 40 MG tablet    Sig: Take 1 tablet (40 mg total) by mouth daily.    Dispense:  90 tablet    Refill:  4   metoprolol  succinate (TOPROL -XL) 50 MG 24 hr tablet    Sig: Take 1 tablet (50 mg total) by mouth daily. Take with or immediately following a meal.    Dispense:  90 tablet    Refill:  4   sertraline  (ZOLOFT ) 100 MG tablet    Sig: Take 1 tablet (100 mg total) by mouth daily.    Dispense:  90 tablet    Refill:  4   valACYclovir (VALTREX) 1000 MG tablet    Sig: Take 2 tablets (2,000 mg total) by mouth 2 (two) times daily for 1 day. Per fever blister flare    Dispense:  20 tablet    Refill:  0    Orders Placed This Encounter  Procedures   Cologuard    Patient Instructions  I will order cologuard  May try valtrex as needed for fever blister, 2 tablets twice daily for 1  day per flare.  Return for shots as discussed  Advanced directive packet provided  Good to see you today Return in 6 months for diabetes follow up visit   Follow up plan: Return in about 6 months (around 12/29/2024) for follow up visit.  Anton Blas, MD

## 2024-06-28 NOTE — Assessment & Plan Note (Signed)
 Continue to encourage healthy diet and lifestyle choices to affect sustainable weight loss.

## 2024-06-28 NOTE — Assessment & Plan Note (Addendum)
 Rx valtrex oral to use PRN fever blister

## 2024-06-28 NOTE — Assessment & Plan Note (Signed)
 Chronic, stable period on sertraline . Also sees counselor.

## 2024-06-28 NOTE — Assessment & Plan Note (Signed)
 Rec yearly imaging, next due 10/2024 - planning to see cardiology

## 2024-06-28 NOTE — Assessment & Plan Note (Signed)
 Chronic, stable on current meds, followed by cards.

## 2024-06-28 NOTE — Assessment & Plan Note (Signed)
Chronic, stable, diet controlled.  

## 2024-06-28 NOTE — Assessment & Plan Note (Signed)
LFTs remain normal.

## 2024-06-28 NOTE — Assessment & Plan Note (Signed)
Chronic, stable off PPI 

## 2024-06-28 NOTE — Assessment & Plan Note (Signed)
 Preventative protocols reviewed and updated unless pt declined. Discussed healthy diet and lifestyle.

## 2024-06-28 NOTE — Assessment & Plan Note (Addendum)
 Chronic, continues atorva 40mg . LDL above goal, <70, ideally <55.  Reviewed diet choices to improve cholesterol levels. He will also schedule cardiology f/u.  The 10-year ASCVD risk score (Arnett DK, et al., 2019) is: 41.6%   Values used to calculate the score:     Age: 73 years     Clincally relevant sex: Male     Is Non-Hispanic African American: No     Diabetic: Yes     Tobacco smoker: No     Systolic Blood Pressure: 136 mmHg     Is BP treated: Yes     HDL Cholesterol: 40 mg/dL     Total Cholesterol: 161 mg/dL

## 2024-06-28 NOTE — Patient Instructions (Addendum)
 I will order cologuard  May try valtrex as needed for fever blister, 2 tablets twice daily for 1 day per flare.  Return for shots as discussed  Advanced directive packet provided  Good to see you today Return in 6 months for diabetes follow up visit

## 2024-06-28 NOTE — Assessment & Plan Note (Signed)

## 2024-06-28 NOTE — Assessment & Plan Note (Signed)
 Chronic, stable. Continue current regimen.

## 2024-07-02 ENCOUNTER — Other Ambulatory Visit: Payer: Self-pay

## 2024-07-04 ENCOUNTER — Ambulatory Visit (INDEPENDENT_AMBULATORY_CARE_PROVIDER_SITE_OTHER): Payer: Self-pay | Admitting: Psychology

## 2024-07-04 DIAGNOSIS — F4323 Adjustment disorder with mixed anxiety and depressed mood: Secondary | ICD-10-CM
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
## 2024-07-18 ENCOUNTER — Ambulatory Visit (INDEPENDENT_AMBULATORY_CARE_PROVIDER_SITE_OTHER): Payer: Self-pay | Admitting: Psychology

## 2024-07-18 DIAGNOSIS — F4323 Adjustment disorder with mixed anxiety and depressed mood: Secondary | ICD-10-CM

## 2024-07-18 NOTE — Progress Notes (Signed)
 [facility] Counselor/Therapist Progress Note  Patient ID: Jonathon Middleton, MRN: [id number],    Date: 07/18/2024  Time Spent: 12:00pm-12:55pm   55 minutes   Treatment Type: Individual Therapy  Reported Symptoms: stress  Mental Status Exam: Appearance:  Casual     Behavior: Appropriate  Motor: Normal  Speech/Language:  Normal Rate  Affect: Appropriate  Mood: normal  Thought process: normal  Thought content:   WNL  Sensory/Perceptual disturbances:   WNL  Orientation: oriented to person, place, time/date, and situation  Attention: Good  Concentration: Good  Memory: WNL  Fund of knowledge:  Good  Insight:   Good  Judgment:  Good  Impulse Control: Good   Risk Assessment: Danger to Self:  No Self-injurious Behavior: No Danger to Others: No Duty to Warn:no Physical Aggression / Violence:No  Access to Firearms a concern: No  Gang Involvement:No   Subjective: Pt present for face-to-face individual therapy via video.  Pt consents to telehealth video session and is aware of limitations and benefits of virtual sessions.  Location of pt: home Location of therapist: home office.   Pt talked about his cat who is elderly and not eating well.  Addressed pt's concerns about his cat.   Pt talked about his son [name] contacting him and telling him the woman at the wedding was lying about pt kissing her.  Pt is relieved especially that the relationship with his son is not jeopardized.  Pt knows that he needs to pay attention to his drinking since he could not be sure about what had happened bc of how much he drank.   Pt talked about work.  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  Addressed pt's work frustrations.  Pt states he has never worked so hard for so little money than he has had to in this job.   Pt has joined a gym and has been taking a couple of exercise classes a week that are good workouts for him.  Pt is also doing some weight  lifting.   Pt talked about his goals for therapy.  He wants to work on codependency, perfectionism, black and white thinking, approval seeking.  Pt wants to focus most on his approval seeking.  Addressed those issues and encouraged pt to ask himself the question does it serve me well? Provided supportive therapy.   Interventions: Cognitive Behavioral Therapy and Insight-Oriented  Diagnosis:  F43.23  Plan of Care: Recommend ongoing therapy.  Pt participated in setting treatment goals.  Plan to continue to meet every two weeks. Pt agrees with treatment plan.   Treatment Plan Client Abilities/Strengths  Pt is bright, engaging, and motivated for therapy.  Client Treatment Preferences  Individual therapy.  Client Statement of Needs  Improve copings skills.  Improve self esteem.  Symptoms  Depressed or irritable mood. Excessive and/or unrealistic worry that is difficult to control occurring more days than not for at least 6 months about a number of events or activities. Hypervigilance (e.g., feeling constantly on edge, experiencing concentration difficulties, having trouble falling or staying asleep, exhibiting a general state of irritability). Low self-esteem. Problems Addressed  Unipolar Depression, Anxiety Goals 1. Alleviate depressive symptoms and return to previous level of effective functioning. 2. Appropriately grieve the loss in order to normalize mood and to return to previously adaptive level of functioning. Objective Learn and implement behavioral strategies to overcome depression. Target Date: 2025-05-09 Frequency: Biweekly  Progress: 65 Modality: individual  Related Interventions Assist the  client in developing skills that increase the likelihood of deriving pleasure from behavioral activation (e.g., assertiveness skills, developing an exercise plan, less internal/more external focus, increased social involvement); reinforce success. Engage the client in behavioral  activation, increasing his/her activity level and contact with sources of reward, while identifying processes that inhibit activation. use behavioral techniques such as instruction, rehearsal, role-playing, role reversal, as needed, to facilitate activity in the client's daily life; reinforce success. 3. Develop healthy interpersonal relationships that lead to the alleviation and help prevent the relapse of depression. 4. Develop healthy thinking patterns and beliefs about self, others, and the world that lead to the alleviation and help prevent the relapse of depression. 5. Enhance ability to effectively cope with the full variety of life's worries and anxieties. 6. Learn and implement coping skills that result in a reduction of anxiety and worry, and improved daily functioning. Objective Learn and implement problem-solving strategies for realistically addressing worries. Target Date: 2025-05-09 Frequency: Biweekly  Progress: 65 Modality: individual  Related Interventions Assign the client a homework exercise in which he/she problem-solves a current problem (see Mastery of Your Anxiety and Worry: Workbook by Richarda and Jonne or Generalized Anxiety Disorder by Delores Filler, and Jonne); review, reinforce success, and provide corrective feedback toward improvement. Teach the client problem-solving strategies involving specifically defining a problem, generating options for addressing it, evaluating the pros and cons of each option, selecting and implementing an optional action, and reevaluating and refining the action. Objective Learn and implement calming skills to reduce overall anxiety and manage anxiety symptoms. Target Date: 2025-05-09 Frequency: Biweekly  Progress: 65 Modality: individual  Related Interventions Assign the client to read about progressive muscle relaxation and other calming strategies in relevant books or treatment manuals (e.g., Progressive Relaxation Training by Thornell armin Collier; Mastery of Your Anxiety and Worry: Workbook by Richarda armin Jonne). Assign the client homework each session in which he/she practices relaxation exercises daily, gradually applying them progressively from non-anxiety-provoking to anxiety-provoking situations; review and reinforce success while providing corrective feedback toward improvement. Teach the client calming/relaxation skills (e.g., applied relaxation, progressive muscle relaxation, cue controlled relaxation; mindful breathing; biofeedback) and how to discriminate better between relaxation and tension; teach the client how to apply these skills to his/her daily life. 7. Recognize, accept, and cope with feelings of depression. 8. Reduce overall frequency, intensity, and duration of the anxiety so that daily functioning is not impaired. 9. Resolve the core conflict that is the source of anxiety. 10. Stabilize anxiety level while increasing ability to function on a daily basis. Diagnosis F43.23  Conditions For Discharge Achievement of treatment goals and objectives   Veva Alma, LCSW

## 2024-08-01 ENCOUNTER — Ambulatory Visit (INDEPENDENT_AMBULATORY_CARE_PROVIDER_SITE_OTHER): Payer: Self-pay | Admitting: Psychology

## 2024-08-01 DIAGNOSIS — F4323 Adjustment disorder with mixed anxiety and depressed mood: Secondary | ICD-10-CM
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
## 2024-08-15 ENCOUNTER — Ambulatory Visit (INDEPENDENT_AMBULATORY_CARE_PROVIDER_SITE_OTHER): Payer: Self-pay | Admitting: Psychology

## 2024-08-15 DIAGNOSIS — F4323 Adjustment disorder with mixed anxiety and depressed mood: Secondary | ICD-10-CM
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
## 2024-08-29 ENCOUNTER — Ambulatory Visit (INDEPENDENT_AMBULATORY_CARE_PROVIDER_SITE_OTHER): Payer: Self-pay | Admitting: Psychology

## 2024-08-29 DIAGNOSIS — F4323 Adjustment disorder with mixed anxiety and depressed mood: Secondary | ICD-10-CM

## 2024-08-29 NOTE — Progress Notes (Signed)
 [facility] Counselor/Therapist Progress Note  Patient ID: Aarya Quebedeaux, MRN: [id number],    Date: 08/29/2024  Time Spent: 12:00pm-12:55pm   55 minutes   Treatment Type: Individual Therapy  Reported Symptoms: stress  Mental Status Exam: Appearance:  Casual     Behavior: Appropriate  Motor: Normal  Speech/Language:  Normal Rate  Affect: Appropriate  Mood: normal  Thought process: normal  Thought content:   WNL  Sensory/Perceptual disturbances:   WNL  Orientation: oriented to person, place, time/date, and situation  Attention: Good  Concentration: Good  Memory: WNL  Fund of knowledge:  Good  Insight:   Good  Judgment:  Good  Impulse Control: Good   Risk Assessment: Danger to Self:  No Self-injurious Behavior: No Danger to Others: No Duty to Warn:no Physical Aggression / Violence:No  Access to Firearms a concern: No  Gang Involvement:No   Subjective: Pt present for face-to-face individual therapy via video.  Pt consents to telehealth video session and is aware of limitations and benefits of virtual sessions.  Location of pt: home Location of therapist: home office.   Pt talked about  Pt talked about his oldest son [name].   [name] and his new wife Randall visited pt last weekend.  They talked about Jenny's pregnancy.  The gender reveal party is this weekend.  Pt states their interactions felt better but pt still wants to have talk with [name] to check out that they are ok.   Pt found out that his sons are fighting due to politics.  Addressed how this impacts pt.   Helped pt process his feelings and relationship dynamics.  Pt is working on responding and not reacting during interpersonal interactions.   Pt has been going to the gym and trying to eat better.   Pt talked about feeling lonely at times.  Pt misses touch and intimacy.  He states he has gotten so use to living by himself that he is not sure he wants a relationship.  Provided supportive  therapy.   Interventions: Cognitive Behavioral Therapy and Insight-Oriented  Diagnosis:  F43.23  Plan of Care: Recommend ongoing therapy.  Pt participated in setting treatment goals.  Plan to continue to meet every two weeks. Pt agrees with treatment plan.   Treatment Plan Client Abilities/Strengths  Pt is bright, engaging, and motivated for therapy.  Client Treatment Preferences  Individual therapy.  Client Statement of Needs  Improve copings skills.  Improve self esteem.  Symptoms  Depressed or irritable mood. Excessive and/or unrealistic worry that is difficult to control occurring more days than not for at least 6 months about a number of events or activities. Hypervigilance (e.g., feeling constantly on edge, experiencing concentration difficulties, having trouble falling or staying asleep, exhibiting a general state of irritability). Low self-esteem. Problems Addressed  Unipolar Depression, Anxiety Goals 1. Alleviate depressive symptoms and return to previous level of effective functioning. 2. Appropriately grieve the loss in order to normalize mood and to return to previously adaptive level of functioning. Objective Learn and implement behavioral strategies to overcome depression. Target Date: 2025-05-09 Frequency: Biweekly  Progress: 65 Modality: individual  Related Interventions Assist the client in developing skills that increase the likelihood of deriving pleasure from behavioral activation (e.g., assertiveness skills, developing an exercise plan, less internal/more external focus, increased social involvement); reinforce success. Engage the client in behavioral activation, increasing his/her activity level and contact with sources of reward, while identifying processes that inhibit activation. use behavioral techniques such as instruction, rehearsal, role-playing, role reversal,  as needed, to facilitate activity in the client's daily life; reinforce success. 3. Develop  healthy interpersonal relationships that lead to the alleviation and help prevent the relapse of depression. 4. Develop healthy thinking patterns and beliefs about self, others, and the world that lead to the alleviation and help prevent the relapse of depression. 5. Enhance ability to effectively cope with the full variety of life's worries and anxieties. 6. Learn and implement coping skills that result in a reduction of anxiety and worry, and improved daily functioning. Objective Learn and implement problem-solving strategies for realistically addressing worries. Target Date: 2025-05-09 Frequency: Biweekly  Progress: 65 Modality: individual  Related Interventions Assign the client a homework exercise in which he/she problem-solves a current problem (see Mastery of Your Anxiety and Worry: Workbook by Richarda and Jonne or Generalized Anxiety Disorder by Delores Filler, and Jonne); review, reinforce success, and provide corrective feedback toward improvement. Teach the client problem-solving strategies involving specifically defining a problem, generating options for addressing it, evaluating the pros and cons of each option, selecting and implementing an optional action, and reevaluating and refining the action. Objective Learn and implement calming skills to reduce overall anxiety and manage anxiety symptoms. Target Date: 2025-05-09 Frequency: Biweekly  Progress: 65 Modality: individual  Related Interventions Assign the client to read about progressive muscle relaxation and other calming strategies in relevant books or treatment manuals (e.g., Progressive Relaxation Training by Thornell armin Collier; Mastery of Your Anxiety and Worry: Workbook by Richarda armin Jonne). Assign the client homework each session in which he/she practices relaxation exercises daily, gradually applying them progressively from non-anxiety-provoking to anxiety-provoking situations; review and reinforce success while  providing corrective feedback toward improvement. Teach the client calming/relaxation skills (e.g., applied relaxation, progressive muscle relaxation, cue controlled relaxation; mindful breathing; biofeedback) and how to discriminate better between relaxation and tension; teach the client how to apply these skills to his/her daily life. 7. Recognize, accept, and cope with feelings of depression. 8. Reduce overall frequency, intensity, and duration of the anxiety so that daily functioning is not impaired. 9. Resolve the core conflict that is the source of anxiety. 10. Stabilize anxiety level while increasing ability to function on a daily basis. Diagnosis F43.23  Conditions For Discharge Achievement of treatment goals and objectives   Veva Alma, LCSW

## 2024-09-09 ENCOUNTER — Other Ambulatory Visit: Payer: Self-pay

## 2024-09-10 ENCOUNTER — Other Ambulatory Visit: Payer: Self-pay

## 2024-09-10 MED ORDER — AREXVY 120 MCG/0.5ML IM SUSR
INTRAMUSCULAR | 0 refills | Status: AC
  Filled 2024-09-10: qty 0.5, 1d supply, fill #0

## 2024-09-10 MED ORDER — FLUZONE HIGH-DOSE 0.5 ML IM SUSY
0.5000 mL | PREFILLED_SYRINGE | Freq: Once | INTRAMUSCULAR | 0 refills | Status: AC
  Filled 2024-09-10: qty 0.5, 1d supply, fill #0

## 2024-09-12 ENCOUNTER — Other Ambulatory Visit: Payer: Self-pay

## 2024-09-12 ENCOUNTER — Ambulatory Visit: Payer: Self-pay | Admitting: Psychology

## 2024-09-12 DIAGNOSIS — F4323 Adjustment disorder with mixed anxiety and depressed mood: Secondary | ICD-10-CM

## 2024-09-12 NOTE — Progress Notes (Signed)
 [facility] Counselor/Therapist Progress Note  Patient ID: Jonathon Middleton, MRN: [id number],    Date: 09/12/2024  Time Spent: 12:00pm-12:55pm   55 minutes   Treatment Type: Individual Therapy  Reported Symptoms: stress  Mental Status Exam: Appearance:  Casual     Behavior: Appropriate  Motor: Normal  Speech/Language:  Normal Rate  Affect: Appropriate  Mood: normal  Thought process: normal  Thought content:   WNL  Sensory/Perceptual disturbances:   WNL  Orientation: oriented to person, place, time/date, and situation  Attention: Good  Concentration: Good  Memory: WNL  Fund of knowledge:  Good  Insight:   Good  Judgment:  Good  Impulse Control: Good   Risk Assessment: Danger to Self:  No Self-injurious Behavior: No Danger to Others: No Duty to Warn:no Physical Aggression / Violence:No  Access to Firearms a concern: No  Gang Involvement:No   Subjective: Pt present for face-to-face individual therapy via video.  Pt consents to telehealth video session and is aware of limitations and benefits of virtual sessions.  Location of pt: home Location of therapist: home office.   Pt talked about his cat passing away.  He had his cat for 15 years.  Pt misses him.  Helped pt process his feelings and grief.   Pt now has no pets.  He has always had a pet since 1986.   Pt talked about going to the gender reveal party.  The baby is going to be a boy.  Pt states the party was exhausting but it went well.   He has connected with his ex-wife's current husband and they are becoming friends.  Pt talked about being introspective.   He is lonely but does not do anything to build additional connections especially with women.  Pt states he is fearful to date again.  Pt feels insecure about where he is financially at this stage in his life and he does not want to have to share his past with someone bc he is embarrassed.  Addressed pt's fears and helped him process his feelings.  Pt  states when he is confronted he shuts down and has done that since he was a child.  Pt feels like he wants to make changes in this.  Addressed how this is impacted by his fear.   Provided supportive therapy.   Interventions: Cognitive Behavioral Therapy and Insight-Oriented  Diagnosis:  F43.23  Plan of Care: Recommend ongoing therapy.  Pt participated in setting treatment goals.  Plan to continue to meet every two weeks. Pt agrees with treatment plan.   Treatment Plan Client Abilities/Strengths  Pt is bright, engaging, and motivated for therapy.  Client Treatment Preferences  Individual therapy.  Client Statement of Needs  Improve copings skills.  Improve self esteem.  Symptoms  Depressed or irritable mood. Excessive and/or unrealistic worry that is difficult to control occurring more days than not for at least 6 months about a number of events or activities. Hypervigilance (e.g., feeling constantly on edge, experiencing concentration difficulties, having trouble falling or staying asleep, exhibiting a general state of irritability). Low self-esteem. Problems Addressed  Unipolar Depression, Anxiety Goals 1. Alleviate depressive symptoms and return to previous level of effective functioning. 2. Appropriately grieve the loss in order to normalize mood and to return to previously adaptive level of functioning. Objective Learn and implement behavioral strategies to overcome depression. Target Date: 2025-05-09 Frequency: Biweekly  Progress: 65 Modality: individual  Related Interventions Assist the client in developing skills that increase the likelihood of deriving  pleasure from behavioral activation (e.g., assertiveness skills, developing an exercise plan, less internal/more external focus, increased social involvement); reinforce success. Engage the client in behavioral activation, increasing his/her activity level and contact with sources of reward, while identifying processes that  inhibit activation. use behavioral techniques such as instruction, rehearsal, role-playing, role reversal, as needed, to facilitate activity in the client's daily life; reinforce success. 3. Develop healthy interpersonal relationships that lead to the alleviation and help prevent the relapse of depression. 4. Develop healthy thinking patterns and beliefs about self, others, and the world that lead to the alleviation and help prevent the relapse of depression. 5. Enhance ability to effectively cope with the full variety of life's worries and anxieties. 6. Learn and implement coping skills that result in a reduction of anxiety and worry, and improved daily functioning. Objective Learn and implement problem-solving strategies for realistically addressing worries. Target Date: 2025-05-09 Frequency: Biweekly  Progress: 65 Modality: individual  Related Interventions Assign the client a homework exercise in which he/she problem-solves a current problem (see Mastery of Your Anxiety and Worry: Workbook by Richarda and Jonne or Generalized Anxiety Disorder by Delores Filler, and Jonne); review, reinforce success, and provide corrective feedback toward improvement. Teach the client problem-solving strategies involving specifically defining a problem, generating options for addressing it, evaluating the pros and cons of each option, selecting and implementing an optional action, and reevaluating and refining the action. Objective Learn and implement calming skills to reduce overall anxiety and manage anxiety symptoms. Target Date: 2025-05-09 Frequency: Biweekly  Progress: 65 Modality: individual  Related Interventions Assign the client to read about progressive muscle relaxation and other calming strategies in relevant books or treatment manuals (e.g., Progressive Relaxation Training by Thornell armin Collier; Mastery of Your Anxiety and Worry: Workbook by Richarda armin Jonne). Assign the client homework each  session in which he/she practices relaxation exercises daily, gradually applying them progressively from non-anxiety-provoking to anxiety-provoking situations; review and reinforce success while providing corrective feedback toward improvement. Teach the client calming/relaxation skills (e.g., applied relaxation, progressive muscle relaxation, cue controlled relaxation; mindful breathing; biofeedback) and how to discriminate better between relaxation and tension; teach the client how to apply these skills to his/her daily life. 7. Recognize, accept, and cope with feelings of depression. 8. Reduce overall frequency, intensity, and duration of the anxiety so that daily functioning is not impaired. 9. Resolve the core conflict that is the source of anxiety. 10. Stabilize anxiety level while increasing ability to function on a daily basis. Diagnosis F43.23  Conditions For Discharge Achievement of treatment goals and objectives   Veva Alma, LCSW

## 2024-09-20 ENCOUNTER — Other Ambulatory Visit: Payer: Self-pay

## 2024-09-23 ENCOUNTER — Other Ambulatory Visit: Payer: Self-pay

## 2024-09-26 ENCOUNTER — Ambulatory Visit: Payer: Self-pay | Admitting: Psychology

## 2024-09-26 DIAGNOSIS — F4323 Adjustment disorder with mixed anxiety and depressed mood: Secondary | ICD-10-CM

## 2024-09-26 NOTE — Progress Notes (Signed)
 [facility] Counselor/Therapist Progress Note  Patient ID: Jonathon Middleton, MRN: [id number],    Date: 09/26/2024  Time Spent: 12:00pm-12:55pm   55 minutes   Treatment Type: Individual Therapy  Reported Symptoms: stress  Mental Status Exam: Appearance:  Casual     Behavior: Appropriate  Motor: Normal  Speech/Language:  Normal Rate  Affect: Appropriate  Mood: normal  Thought process: normal  Thought content:   WNL  Sensory/Perceptual disturbances:   WNL  Orientation: oriented to person, place, time/date, and situation  Attention: Good  Concentration: Good  Memory: WNL  Fund of knowledge:  Good  Insight:   Good  Judgment:  Good  Impulse Control: Good   Risk Assessment: Danger to Self:  No Self-injurious Behavior: No Danger to Others: No Duty to Warn:no Physical Aggression / Violence:No  Access to Firearms a concern: No  Gang Involvement:No   Subjective: Pt present for face-to-face individual therapy via video.  Pt consents to telehealth video session and is aware of limitations and benefits of virtual sessions.  Location of pt: home Location of therapist: home office.   Pt talked about his son Jonathon Middleton.  Pt got together with him and pt talked with him about how Jonathon Middleton is adjusting to his wife being pregnant.  Jonathon Middleton knows his lifestyle will change especially financially.  Jonathon Middleton is use to doing whatever he wants with his disposable income and know that will change.  Pt is concerned about how his son is doing.   Pt tends to be hard on himself about whether he is a good father.   Addressed pt's thoughts and identified the ways that pt has been and is a very good father.  Pt's son Jonathon Middleton does not connect with pt as much as he would like.  Jonathon Middleton is very involved with his wife's family and church.  Pt talked about work.  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  They are doing  it in their own ways that work for them.   Pt states this has helped him release faulty expectations.  His week has been much better.  Provided supportive therapy.   Interventions: Cognitive Behavioral Therapy and Insight-Oriented  Diagnosis:  F43.23  Plan of Care: Recommend ongoing therapy.  Pt participated in setting treatment goals.  Plan to continue to meet every two weeks. Pt agrees with treatment plan.   Treatment Plan Client Abilities/Strengths  Pt is bright, engaging, and motivated for therapy.  Client Treatment Preferences  Individual therapy.  Client Statement of Needs  Improve copings skills.  Improve self esteem.  Symptoms  Depressed or irritable mood. Excessive and/or unrealistic worry that is difficult to control occurring more days than not for at least 6 months about a number of events or activities. Hypervigilance (e.g., feeling constantly on edge, experiencing concentration difficulties, having trouble falling or staying asleep, exhibiting a general state of irritability). Low self-esteem. Problems Addressed  Unipolar Depression, Anxiety Goals 1. Alleviate depressive symptoms and return to previous level of effective functioning. 2. Appropriately grieve the loss in order to normalize mood and to return to previously adaptive level of functioning. Objective Learn and implement behavioral strategies to overcome depression. Target Date: 2025-05-09 Frequency: Biweekly  Progress: 65 Modality: individual  Related Interventions Assist the client in developing skills that increase the likelihood of deriving pleasure from behavioral activation (e.g., assertiveness skills, developing an exercise plan, less internal/more external focus, increased social  involvement); reinforce success. Engage the client in behavioral activation, increasing his/her activity level and contact with sources of reward, while identifying processes that inhibit activation. use behavioral techniques  such as instruction, rehearsal, role-playing, role reversal, as needed, to facilitate activity in the client's daily life; reinforce success. 3. Develop healthy interpersonal relationships that lead to the alleviation and help prevent the relapse of depression. 4. Develop healthy thinking patterns and beliefs about self, others, and the world that lead to the alleviation and help prevent the relapse of depression. 5. Enhance ability to effectively cope with the full variety of life's worries and anxieties. 6. Learn and implement coping skills that result in a reduction of anxiety and worry, and improved daily functioning. Objective Learn and implement problem-solving strategies for realistically addressing worries. Target Date: 2025-05-09 Frequency: Biweekly  Progress: 65 Modality: individual  Related Interventions Assign the client a homework exercise in which he/she problem-solves a current problem (see Mastery of Your Anxiety and Worry: Workbook by Richarda and Jonne or Generalized Anxiety Disorder by Delores Filler, and Jonne); review, reinforce success, and provide corrective feedback toward improvement. Teach the client problem-solving strategies involving specifically defining a problem, generating options for addressing it, evaluating the pros and cons of each option, selecting and implementing an optional action, and reevaluating and refining the action. Objective Learn and implement calming skills to reduce overall anxiety and manage anxiety symptoms. Target Date: 2025-05-09 Frequency: Biweekly  Progress: 65 Modality: individual  Related Interventions Assign the client to read about progressive muscle relaxation and other calming strategies in relevant books or treatment manuals (e.g., Progressive Relaxation Training by Thornell armin Collier; Mastery of Your Anxiety and Worry: Workbook by Richarda armin Jonne). Assign the client homework each session in which he/she practices relaxation  exercises daily, gradually applying them progressively from non-anxiety-provoking to anxiety-provoking situations; review and reinforce success while providing corrective feedback toward improvement. Teach the client calming/relaxation skills (e.g., applied relaxation, progressive muscle relaxation, cue controlled relaxation; mindful breathing; biofeedback) and how to discriminate better between relaxation and tension; teach the client how to apply these skills to his/her daily life. 7. Recognize, accept, and cope with feelings of depression. 8. Reduce overall frequency, intensity, and duration of the anxiety so that daily functioning is not impaired. 9. Resolve the core conflict that is the source of anxiety. 10. Stabilize anxiety level while increasing ability to function on a daily basis. Diagnosis F43.23  Conditions For Discharge Achievement of treatment goals and objectives   Veva Alma, LCSW

## 2024-10-03 ENCOUNTER — Other Ambulatory Visit: Payer: Self-pay

## 2024-10-10 ENCOUNTER — Ambulatory Visit: Payer: Self-pay | Admitting: Psychology

## 2024-10-10 DIAGNOSIS — F4323 Adjustment disorder with mixed anxiety and depressed mood: Secondary | ICD-10-CM

## 2024-10-10 NOTE — Progress Notes (Signed)
 [facility] Counselor/Therapist Progress Note  Patient ID: Jonathon Middleton, MRN: [id number],    Date: 10/10/2024  Time Spent: 12:00pm-12:55pm   55 minutes   Treatment Type: Individual Therapy  Reported Symptoms: stress  Mental Status Exam: Appearance:  Casual     Behavior: Appropriate  Motor: Normal  Speech/Language:  Normal Rate  Affect: Appropriate  Mood: normal  Thought process: normal  Thought content:   WNL  Sensory/Perceptual disturbances:   WNL  Orientation: oriented to person, place, time/date, and situation  Attention: Good  Concentration: Good  Memory: WNL  Fund of knowledge:  Good  Insight:   Good  Judgment:  Good  Impulse Control: Good   Risk Assessment: Danger to Self:  No Self-injurious Behavior: No Danger to Others: No Duty to Warn:no Physical Aggression / Violence:No  Access to Firearms a concern: No  Gang Involvement:No   Subjective: Pt present for face-to-face individual therapy via video.  Pt consents to telehealth video session and is aware of limitations and benefits of virtual sessions.  Location of pt: home Location of therapist: home office.   Pt talked about doing well overall.  He has been doing some self reflection about his childhood and how much he seeks approval from others.  Pt wonders if he is giving off energy he is not aware of bc of seeking approval.  Pt has been going down a rabbit hole of trying to figure out the one thing that has made him the way he is so he can fix it.   Addressed how there is no magic pill of insight.   A combination of nature and nuture shaped who he is today.    Pt talked about being lonely.   He does not know what to do about it bc he does not want to or is anxious about being in a relationship.  Addressed pt's fears about the unknown in relationships.   He is fearful about making the same mistakes as in the past.  Helped pt process his thoughts and feelings.  Addressed how he can release  making decisions from fear.  Provided supportive therapy.   Interventions: Cognitive Behavioral Therapy and Insight-Oriented  Diagnosis:  F43.23  Plan of Care: Recommend ongoing therapy.  Pt participated in setting treatment goals.  Plan to continue to meet every two weeks. Pt agrees with treatment plan.   Treatment Plan Client Abilities/Strengths  Pt is bright, engaging, and motivated for therapy.  Client Treatment Preferences  Individual therapy.  Client Statement of Needs  Improve copings skills.  Improve self esteem.  Symptoms  Depressed or irritable mood. Excessive and/or unrealistic worry that is difficult to control occurring more days than not for at least 6 months about a number of events or activities. Hypervigilance (e.g., feeling constantly on edge, experiencing concentration difficulties, having trouble falling or staying asleep, exhibiting a general state of irritability). Low self-esteem. Problems Addressed  Unipolar Depression, Anxiety Goals 1. Alleviate depressive symptoms and return to previous level of effective functioning. 2. Appropriately grieve the loss in order to normalize mood and to return to previously adaptive level of functioning. Objective Learn and implement behavioral strategies to overcome depression. Target Date: 2025-05-09 Frequency: Biweekly  Progress: 65 Modality: individual  Related Interventions Assist the client in developing skills that increase the likelihood of deriving pleasure from behavioral activation (e.g., assertiveness skills, developing an exercise plan, less internal/more external focus, increased social involvement); reinforce success. Engage the client in behavioral activation, increasing his/her activity level and contact  with sources of reward, while identifying processes that inhibit activation. use behavioral techniques such as instruction, rehearsal, role-playing, role reversal, as needed, to facilitate activity in the  client's daily life; reinforce success. 3. Develop healthy interpersonal relationships that lead to the alleviation and help prevent the relapse of depression. 4. Develop healthy thinking patterns and beliefs about self, others, and the world that lead to the alleviation and help prevent the relapse of depression. 5. Enhance ability to effectively cope with the full variety of life's worries and anxieties. 6. Learn and implement coping skills that result in a reduction of anxiety and worry, and improved daily functioning. Objective Learn and implement problem-solving strategies for realistically addressing worries. Target Date: 2025-05-09 Frequency: Biweekly  Progress: 65 Modality: individual  Related Interventions Assign the client a homework exercise in which he/she problem-solves a current problem (see Mastery of Your Anxiety and Worry: Workbook by Richarda and Jonne or Generalized Anxiety Disorder by Delores Filler, and Jonne); review, reinforce success, and provide corrective feedback toward improvement. Teach the client problem-solving strategies involving specifically defining a problem, generating options for addressing it, evaluating the pros and cons of each option, selecting and implementing an optional action, and reevaluating and refining the action. Objective Learn and implement calming skills to reduce overall anxiety and manage anxiety symptoms. Target Date: 2025-05-09 Frequency: Biweekly  Progress: 65 Modality: individual  Related Interventions Assign the client to read about progressive muscle relaxation and other calming strategies in relevant books or treatment manuals (e.g., Progressive Relaxation Training by Thornell armin Collier; Mastery of Your Anxiety and Worry: Workbook by Richarda armin Jonne). Assign the client homework each session in which he/she practices relaxation exercises daily, gradually applying them progressively from non-anxiety-provoking to anxiety-provoking  situations; review and reinforce success while providing corrective feedback toward improvement. Teach the client calming/relaxation skills (e.g., applied relaxation, progressive muscle relaxation, cue controlled relaxation; mindful breathing; biofeedback) and how to discriminate better between relaxation and tension; teach the client how to apply these skills to his/her daily life. 7. Recognize, accept, and cope with feelings of depression. 8. Reduce overall frequency, intensity, and duration of the anxiety so that daily functioning is not impaired. 9. Resolve the core conflict that is the source of anxiety. 10. Stabilize anxiety level while increasing ability to function on a daily basis. Diagnosis F43.23  Conditions For Discharge Achievement of treatment goals and objectives   Veva Alma, LCSW

## 2024-10-20 ENCOUNTER — Other Ambulatory Visit: Payer: Self-pay

## 2024-10-21 ENCOUNTER — Other Ambulatory Visit: Payer: Self-pay

## 2024-10-21 MED ORDER — BOOSTRIX 5-2.5-18.5 LF-MCG/0.5 IM SUSY
PREFILLED_SYRINGE | INTRAMUSCULAR | 0 refills | Status: AC
  Filled 2024-10-21: qty 0.5, 1d supply, fill #0

## 2024-10-21 MED ORDER — SHINGRIX 50 MCG/0.5ML IM SUSR
INTRAMUSCULAR | 1 refills | Status: AC
  Filled 2024-10-21: qty 1, 1d supply, fill #0

## 2024-10-24 ENCOUNTER — Ambulatory Visit: Payer: Self-pay | Admitting: Psychology

## 2024-10-24 DIAGNOSIS — F4323 Adjustment disorder with mixed anxiety and depressed mood: Secondary | ICD-10-CM

## 2024-10-24 NOTE — Progress Notes (Signed)
 [facility] Counselor/Therapist Progress Note  Patient ID: Rohil Lesch, MRN: [id number],    Date: 10/24/2024  Time Spent: 12:00pm-12:55pm   55 minutes   Treatment Type: Individual Therapy  Reported Symptoms: stress  Mental Status Exam: Appearance:  Casual     Behavior: Appropriate  Motor: Normal  Speech/Language:  Normal Rate  Affect: Appropriate  Mood: normal  Thought process: normal  Thought content:   WNL  Sensory/Perceptual disturbances:   WNL  Orientation: oriented to person, place, time/date, and situation  Attention: Good  Concentration: Good  Memory: WNL  Fund of knowledge:  Good  Insight:   Good  Judgment:  Good  Impulse Control: Good   Risk Assessment: Danger to Self:  No Self-injurious Behavior: No Danger to Others: No Duty to Warn:no Physical Aggression / Violence:No  Access to Firearms a concern: No  Gang Involvement:No   Subjective: Pt present for face-to-face individual therapy via video.  Pt consents to telehealth video session and is aware of limitations and benefits of virtual sessions.  Location of pt: home Location of therapist: home office.   Pt talked about work.  He has been busy and has felt stressed.  Pt has also felt frustrated with his boss at times.  Addressed pt's frustrations.  Pt states all his hard work is not yielding results.  Pt has been having thoughts about not being enough.  Pt feels like every day is the same.  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   Worked on how to fiserv and thought reframing.  Pt talked about having some highs and lows.  This past weekend pt went out to dinner by himself.  Sunday he went out to eat with his son and his wife and it went well.   Pt's youngest son's father in law contacted pt and told pt his son never talks about him.  This hurt pt's feelings.  Helped pt process his feelings and relationship dynamics.  Addressed how pt can connect with his youngest son.    Provided supportive therapy.   Interventions: Cognitive Behavioral Therapy and Insight-Oriented  Diagnosis:  F43.23  Plan of Care: Recommend ongoing therapy.  Pt participated in setting treatment goals.  Plan to continue to meet every two weeks. Pt agrees with treatment plan.   Treatment Plan Client Abilities/Strengths  Pt is bright, engaging, and motivated for therapy.  Client Treatment Preferences  Individual therapy.  Client Statement of Needs  Improve copings skills.  Improve self esteem.  Symptoms  Depressed or irritable mood. Excessive and/or unrealistic worry that is difficult to control occurring more days than not for at least 6 months about a number of events or activities. Hypervigilance (e.g., feeling constantly on edge, experiencing concentration difficulties, having trouble falling or staying asleep, exhibiting a general state of irritability). Low self-esteem. Problems Addressed  Unipolar Depression, Anxiety Goals 1. Alleviate depressive symptoms and return to previous level of effective functioning. 2. Appropriately grieve the loss in order to normalize mood and to return to previously adaptive level of functioning. Objective Learn and implement behavioral strategies to overcome depression. Target Date: 2025-05-09 Frequency: Biweekly  Progress: 65 Modality: individual  Related Interventions Assist the client in developing skills that increase the likelihood of deriving pleasure from behavioral activation (e.g., assertiveness skills, developing an exercise plan, less internal/more external focus, increased social involvement); reinforce success. Engage the client in behavioral activation, increasing his/her activity level and contact with sources of reward, while identifying processes that inhibit activation. use behavioral techniques such  as instruction, rehearsal, role-playing, role reversal, as needed, to facilitate activity in the client's daily life; reinforce  success. 3. Develop healthy interpersonal relationships that lead to the alleviation and help prevent the relapse of depression. 4. Develop healthy thinking patterns and beliefs about self, others, and the world that lead to the alleviation and help prevent the relapse of depression. 5. Enhance ability to effectively cope with the full variety of life's worries and anxieties. 6. Learn and implement coping skills that result in a reduction of anxiety and worry, and improved daily functioning. Objective Learn and implement problem-solving strategies for realistically addressing worries. Target Date: 2025-05-09 Frequency: Biweekly  Progress: 65 Modality: individual  Related Interventions Assign the client a homework exercise in which he/she problem-solves a current problem (see Mastery of Your Anxiety and Worry: Workbook by Richarda and Jonne or Generalized Anxiety Disorder by Delores Filler, and Jonne); review, reinforce success, and provide corrective feedback toward improvement. Teach the client problem-solving strategies involving specifically defining a problem, generating options for addressing it, evaluating the pros and cons of each option, selecting and implementing an optional action, and reevaluating and refining the action. Objective Learn and implement calming skills to reduce overall anxiety and manage anxiety symptoms. Target Date: 2025-05-09 Frequency: Biweekly  Progress: 65 Modality: individual  Related Interventions Assign the client to read about progressive muscle relaxation and other calming strategies in relevant books or treatment manuals (e.g., Progressive Relaxation Training by Thornell armin Collier; Mastery of Your Anxiety and Worry: Workbook by Richarda armin Jonne). Assign the client homework each session in which he/she practices relaxation exercises daily, gradually applying them progressively from non-anxiety-provoking to anxiety-provoking situations; review and reinforce  success while providing corrective feedback toward improvement. Teach the client calming/relaxation skills (e.g., applied relaxation, progressive muscle relaxation, cue controlled relaxation; mindful breathing; biofeedback) and how to discriminate better between relaxation and tension; teach the client how to apply these skills to his/her daily life. 7. Recognize, accept, and cope with feelings of depression. 8. Reduce overall frequency, intensity, and duration of the anxiety so that daily functioning is not impaired. 9. Resolve the core conflict that is the source of anxiety. 10. Stabilize anxiety level while increasing ability to function on a daily basis. Diagnosis F43.23  Conditions For Discharge Achievement of treatment goals and objectives   Veva Alma, LCSW

## 2024-11-07 ENCOUNTER — Ambulatory Visit: Payer: Self-pay | Admitting: Psychology

## 2024-11-07 DIAGNOSIS — F4323 Adjustment disorder with mixed anxiety and depressed mood: Secondary | ICD-10-CM

## 2024-11-07 NOTE — Progress Notes (Signed)
 [facility] Counselor/Therapist Progress Note  Patient ID: Jonathon Middleton, MRN: [id number],    Date: 11/07/2024  Time Spent: 12:00pm-12:55pm   55 minutes   Treatment Type: Individual Therapy  Reported Symptoms: stress  Mental Status Exam: Appearance:  Casual     Behavior: Appropriate  Motor: Normal  Speech/Language:  Normal Rate  Affect: Appropriate  Mood: normal  Thought process: normal  Thought content:   WNL  Sensory/Perceptual disturbances:   WNL  Orientation: oriented to person, place, time/date, and situation  Attention: Good  Concentration: Good  Memory: WNL  Fund of knowledge:  Good  Insight:   Good  Judgment:  Good  Impulse Control: Good   Risk Assessment: Danger to Self:  No Self-injurious Behavior: No Danger to Others: No Duty to Warn:no Physical Aggression / Violence:No  Access to Firearms a concern: No  Gang Involvement:No   Subjective: Pt present for face-to-face individual therapy via video.  Pt consents to telehealth video session and is aware of limitations and benefits of virtual sessions.  Location of pt: home Location of therapist: home office.   Pt talked about work.   Pt talked about Thanksgiving.  He states it was uneventful. He was home alone and watched football.  He rearranged some furniture in his living room.   The day after Thanksgiving his youngest son and his family visited pt. They had a nice visit.  Pt visited his son [name] and his wife on Sunday and had a good time there.  Pt states he had a lot of time by himself and was thinking a lot.  He has been wondering if he is really lonely or if he is just having trouble adjusting to being alone most of the time. Pt states he does not think he wants to be married again.  Worked with pt on how he can have connections that are positive for him without having to have an expectation of marriage.   Helped pt process his feelings and thoughts about his life.  Addressed his all or  nothing thinking and adjusting expectations.  Provided supportive therapy.   Interventions: Cognitive Behavioral Therapy and Insight-Oriented  Diagnosis:  F43.23  Plan of Care: Recommend ongoing therapy.  Pt participated in setting treatment goals.  Plan to continue to meet every two weeks. Pt agrees with treatment plan.   Treatment Plan Client Abilities/Strengths  Pt is bright, engaging, and motivated for therapy.  Client Treatment Preferences  Individual therapy.  Client Statement of Needs  Improve copings skills.  Improve self esteem.  Symptoms  Depressed or irritable mood. Excessive and/or unrealistic worry that is difficult to control occurring more days than not for at least 6 months about a number of events or activities. Hypervigilance (e.g., feeling constantly on edge, experiencing concentration difficulties, having trouble falling or staying asleep, exhibiting a general state of irritability). Low self-esteem. Problems Addressed  Unipolar Depression, Anxiety Goals 1. Alleviate depressive symptoms and return to previous level of effective functioning. 2. Appropriately grieve the loss in order to normalize mood and to return to previously adaptive level of functioning. Objective Learn and implement behavioral strategies to overcome depression. Target Date: 2025-05-09 Frequency: Biweekly  Progress: 65 Modality: individual  Related Interventions Assist the client in developing skills that increase the likelihood of deriving pleasure from behavioral activation (e.g., assertiveness skills, developing an exercise plan, less internal/more external focus, increased social involvement); reinforce success. Engage the client in behavioral activation, increasing his/her activity level and contact with sources of reward, while  identifying processes that inhibit activation. use behavioral techniques such as instruction, rehearsal, role-playing, role reversal, as needed, to facilitate  activity in the client's daily life; reinforce success. 3. Develop healthy interpersonal relationships that lead to the alleviation and help prevent the relapse of depression. 4. Develop healthy thinking patterns and beliefs about self, others, and the world that lead to the alleviation and help prevent the relapse of depression. 5. Enhance ability to effectively cope with the full variety of life's worries and anxieties. 6. Learn and implement coping skills that result in a reduction of anxiety and worry, and improved daily functioning. Objective Learn and implement problem-solving strategies for realistically addressing worries. Target Date: 2025-05-09 Frequency: Biweekly  Progress: 65 Modality: individual  Related Interventions Assign the client a homework exercise in which he/she problem-solves a current problem (see Mastery of Your Anxiety and Worry: Workbook by Richarda and Jonne or Generalized Anxiety Disorder by Delores Filler, and Jonne); review, reinforce success, and provide corrective feedback toward improvement. Teach the client problem-solving strategies involving specifically defining a problem, generating options for addressing it, evaluating the pros and cons of each option, selecting and implementing an optional action, and reevaluating and refining the action. Objective Learn and implement calming skills to reduce overall anxiety and manage anxiety symptoms. Target Date: 2025-05-09 Frequency: Biweekly  Progress: 65 Modality: individual  Related Interventions Assign the client to read about progressive muscle relaxation and other calming strategies in relevant books or treatment manuals (e.g., Progressive Relaxation Training by Thornell armin Collier; Mastery of Your Anxiety and Worry: Workbook by Richarda armin Jonne). Assign the client homework each session in which he/she practices relaxation exercises daily, gradually applying them progressively from non-anxiety-provoking to  anxiety-provoking situations; review and reinforce success while providing corrective feedback toward improvement. Teach the client calming/relaxation skills (e.g., applied relaxation, progressive muscle relaxation, cue controlled relaxation; mindful breathing; biofeedback) and how to discriminate better between relaxation and tension; teach the client how to apply these skills to his/her daily life. 7. Recognize, accept, and cope with feelings of depression. 8. Reduce overall frequency, intensity, and duration of the anxiety so that daily functioning is not impaired. 9. Resolve the core conflict that is the source of anxiety. 10. Stabilize anxiety level while increasing ability to function on a daily basis. Diagnosis F43.23  Conditions For Discharge Achievement of treatment goals and objectives   Veva Alma, LCSW

## 2024-11-10 ENCOUNTER — Other Ambulatory Visit: Payer: Self-pay | Admitting: Cardiovascular Disease

## 2024-11-10 DIAGNOSIS — I10 Essential (primary) hypertension: Secondary | ICD-10-CM

## 2024-11-12 NOTE — Telephone Encounter (Signed)
Please contact pt for future appointment. 

## 2024-11-14 ENCOUNTER — Ambulatory Visit: Payer: Self-pay | Attending: Physician Assistant | Admitting: Physician Assistant

## 2024-11-14 ENCOUNTER — Ambulatory Visit: Payer: Self-pay | Attending: Physician Assistant

## 2024-11-14 ENCOUNTER — Encounter: Payer: Self-pay | Admitting: Physician Assistant

## 2024-11-14 VITALS — BP 104/68 | HR 67 | Ht 70.0 in | Wt 220.4 lb

## 2024-11-14 DIAGNOSIS — E1169 Type 2 diabetes mellitus with other specified complication: Secondary | ICD-10-CM

## 2024-11-14 DIAGNOSIS — I10 Essential (primary) hypertension: Secondary | ICD-10-CM

## 2024-11-14 DIAGNOSIS — E785 Hyperlipidemia, unspecified: Secondary | ICD-10-CM

## 2024-11-14 DIAGNOSIS — I2510 Atherosclerotic heart disease of native coronary artery without angina pectoris: Secondary | ICD-10-CM

## 2024-11-14 DIAGNOSIS — I480 Paroxysmal atrial fibrillation: Secondary | ICD-10-CM

## 2024-11-14 DIAGNOSIS — I77810 Thoracic aortic ectasia: Secondary | ICD-10-CM

## 2024-11-14 MED ORDER — EZETIMIBE 10 MG PO TABS: 10.0000 mg | ORAL_TABLET | Freq: Every day | ORAL | 3 refills | Status: AC

## 2024-11-14 NOTE — Progress Notes (Signed)
 Cardiology Office Note    Date:  11/14/2024   ID:  Ravindra Baranek, DOB January 05, 1951, MRN [id number]  PCP:  Rilla Baller, MD  Cardiologist:  None  Electrophysiologist:  None   Chief Complaint: Follow up  History of Present Illness:   Jonathon Middleton is a 73 y.o. male with history of paroxysmal atrial fibrillation, hypertension, hyperlipidemia, coronary calcification, type 2 diabetes, and prior tobacco use x 5 years who presents for follow up on afib and coronary calcification.     Patient was initially evaluated in 2014 by Dr. Gollan for asymptomatic atrial fibrillation which was found on routine physical exam by his PCP.  He had an echocardiogram and exercise stress test at that time which were both normal per prior notes.  Was historically managed with metoprolol , diltiazem , and amiodarone  for his atrial fibrillation.  TSH was noted to be elevated in 2023 at 8.7 and amiodarone  was subsequently discontinued.  He was started on flecainide  100 mg twice daily as needed for A-fib spells in 10/2023 per primary cardiologist.  Calcium  scoring low 2024 revealed score of 1219 which was 87th percentile for age and sex matched control.  CT overread notable for ectasia of the ascending thoracic aorta at 4.4 cm in diameter with recommendation for annual imaging follow-up by CTA or MRA.    Patient presents today overall doing well from a cardiac perspective. He stays very active, working out almost every day. He has a systems analyst. He does so without exertional angina or dyspnea. He is also set up to see a dietician next week. He is trying to lose weight but has been hitting a wall. He wants to focus more on dietary changes. He is asymptomatic to his atrial fibrillation and can only tell if he is in it by checking his blood pressure and his machine alerting him that his pulse is irregular. He checks his blood pressure every morning with reading well controlled. He reports that he is alerted several  times per week that his pulse is irregular, more often so now since he transitioned from amiodarone  to flecainide . His prescription for flecainide  reads as needed but he takes this twice daily. He denies palpitations, lightheadedness, dizziness, and lower extremity swelling.   Labs independently reviewed: 06/2024-Hgb 15.2, HCT 46.2, platelets 203, potassium 4.4, BUN 22, creatinine 1.02, normal LFTs, A1c 6.6, TC 161, TG 143, HDL 40, LDL 93  Objective   Past Medical History:  Diagnosis Date   Arthritis    Atrial fibrillation (HCC)    Depression    Depression with anxiety    Diabetes mellitus without complication (HCC) 08/27/2009   Pre-diabetes   GERD (gastroesophageal reflux disease)    08/24/18- not current   History of chicken pox    Humerus fracture    x 2   Hyperlipidemia    Hypertension    Nonalcoholic fatty liver disease    ?h/o elevated LFTs, has not had US    Pre-diabetes    Metobolic Syndrome   Prediabetes    treated with diet & exercise. saw [facility] nutritionist 04/2014    Current Medications: Active Medications[1]  Allergies:   Patient has no known allergies.   Social History   Socioeconomic History   Marital status: Single    Spouse name: Not on file   Number of children: Not on file   Years of education: Not on file   Highest education level: Not on file  Occupational History   Not on file  Tobacco Use  Smoking status: Former    Current packs/day: 1.00    Average packs/day: 1 pack/day for 5.0 years (5.0 ttl pk-yrs)    Types: Cigars, Cigarettes   Smokeless tobacco: Never   Tobacco comments:    ocassional last time 03/2017  Vaping Use   Vaping status: Never Used  Substance and Sexual Activity   Alcohol use: Yes    Alcohol/week: 2.0 standard drinks of alcohol    Types: 2 Cans of beer per week    Comment: last use four weeks ago   Drug use: No   Sexual activity: Not on file  Other Topics Concern   Not on file  Social History Narrative   Lives with wife,  son, cats and dogs   Divorced    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 [employer]    Activity: none currently   Diet: good water, seldom fruits/vegetables, fish weekly   Social Drivers of Health   Tobacco Use: Medium Risk (11/14/2024)   Patient History    Smoking Tobacco Use: Former    Smokeless Tobacco Use: Never    Passive Exposure: Not on Actuary Strain: Not on file  Food Insecurity: Not on file  Transportation Needs: Not on file  Physical Activity: Not on file  Stress: Not on file  Social Connections: Not on file  Depression (PHQ2-9): Low Risk (06/28/2024)   Depression (PHQ2-9)    PHQ-2 Score: 2  Alcohol Screen: Not on file  Housing: Not on file  Utilities: Not on file  Health Literacy: Not on file     Family History:  The patient's family history includes CAD (age of onset: 13) in his father; CAD (age of onset: 56) in his paternal uncle; Cancer in his maternal uncle; Diabetes in his paternal grandmother; Hypertension in his mother; Stroke in his paternal aunt.  ROS:   12-point review of systems is negative unless otherwise noted in the HPI.  EKGs/Other Studies Reviewed:    Studies reviewed were summarized above. The additional studies were reviewed today:  10/2023 Calcium  score 1. Coronary calcium  score of 1219. This was 87th percentile for age and sex matched control. 2. CAC >300 in LAD, RCA. CAC-DRS A3/N2. 3. Recommend aspirin and statin if no contraindication. 4. Recommend cardiology consultation. 5. Continue heart healthy lifestyle and risk factor modification.  EKG:  EKG personally reviewed by me today EKG Interpretation Date/Time:  Thursday November 14 2024 07:58:40 EST Ventricular Rate:  67 PR Interval:    QRS Duration:  170 QT Interval:  478 QTC Calculation: 505 R Axis:   101  Text Interpretation: Atrial fibrillation Right bundle branch block Confirmed by Lorene Sinclair ([id number]) on 11/14/2024  8:03:10 AM  PHYSICAL EXAM:    VS:  BP 104/68 (BP Location: Left Arm, Patient Position: Sitting, Cuff Size: Normal)   Pulse 67   Ht 5' 10 (1.778 m)   Wt 220 lb 6.4 oz (100 kg)   SpO2 96%   BMI 31.62 kg/m   BMI: Body mass index is 31.62 kg/m.  GEN: Well nourished, well developed in no acute distress NECK: No JVD; No carotid bruits CARDIAC: IRIR, no murmurs, rubs, gallops RESPIRATORY:  Clear to auscultation without rales, wheezing or rhonchi  ABDOMEN: Soft, non-tender, non-distended EXTREMITIES: No edema; No deformity  Wt Readings from Last 3 Encounters:  11/14/24 220 lb 6.4 oz (100 kg)  06/28/24 213 lb (96.6 kg)  12/29/23 213 lb (96.6 kg)  ASSESSMENT & PLAN:   Paroxysmal atrial fibrillation - EKG today shows atrial fibrillation, rate 67 bpm. He is asymptomatic. Reports his BP cuff often alerts him to abnormal heart rhythm. Recommend discontinuing flecainide . Obtain 2 week Zio XT to assess burden. Continue Eliquis  5 mg twice daily for CHA2DSVASc of 4. Continue metoprolol  succinate 50 mg daily and diltiazem  120 mg daily. Refer to EP for further management.   Essential hypertension - BP well controlled on current regimen. Continue metoprolol  and diltiazem  as above.   Coronary artery calcifications Hyperlipidemia - Calcium  score of 1219 which is 87th percentile for age and sex matched controls in 10/2023. Most recent lipid panel 06/2020 with LDL 93, above goal of < 70. Recommend starting ezetimibe 10 mg daily in addition to atorvastatin  40 mg daily.   Ectasia of ascending thoracic aorta - Noted on CT 10/2023, measured 4.4 cm. Recommend repeat imaging with CTA aorta.     Disposition: Stop flecainide . Obtain 2 week Zio. Refer to EP. Start ezetimibe. Obtain CTA aorta. F/u with Dr. Gollan or an APP in 1 year or sooner as needed.   Medication Adjustments/Labs and Tests Ordered: Current medicines are reviewed at length with the patient today.  Concerns  regarding medicines are outlined above. Medication changes, Labs and Tests ordered today are summarized above and listed in the Patient Instructions accessible in Encounters.   Bonney Lesley Maffucci, PA-C 11/14/2024 9:12 AM     [facility] [place] [address] [place], [place] [address] [phone]      [1]  Current Meds  Medication Sig   Accu-Chek Softclix Lancets lancets Use as instructed to check blood sugar once daily   apixaban  (ELIQUIS ) 5 MG TABS tablet Take 1 tablet (5 mg total) by mouth 2 (two) times daily.   atorvastatin  (LIPITOR) 40 MG tablet Take 1 tablet (40 mg total) by mouth daily.   Blood Glucose Monitoring Suppl (ACCU-CHEK GUIDE ME) w/Device KIT Use as instructed to check blood sugar once daily   CARDIZEM  CD 120 MG 24 hr capsule TAKE 1 CAPSULE 2 TIMES DAILY.   Cholecalciferol (VITAMIN D ) 2000 units CAPS Take 2,000 Units by mouth 2 (two) times daily.    Coenzyme Q10 (CO Q-10) 100 MG CAPS Take 100 mg by mouth daily with breakfast.    ezetimibe (ZETIA) 10 MG tablet Take 1 tablet (10 mg total) by mouth daily.   glucose blood (ACCU-CHEK GUIDE) test strip Use as instructed to check blood sugar once daily   metoprolol  succinate (TOPROL -XL) 50 MG 24 hr tablet Take 1 tablet (50 mg total) by mouth daily. Take with or immediately following a meal.   omega-3 fish oil (MAXEPA) 1000 MG CAPS capsule Take 1 capsule by mouth 2 (two) times daily.    RSV vaccine recomb adjuvanted (AREXVY ) 120 MCG/0.5ML injection Inject into the muscle.   sertraline  (ZOLOFT ) 100 MG tablet Take 1 tablet (100 mg total) by mouth daily.   Tdap (BOOSTRIX ) 5-2.5-18.5 LF-MCG/0.5 injection Inject into the muscle.   TURMERIC PO Take by mouth in the morning and at bedtime.   Zoster Vaccine Adjuvanted (SHINGRIX ) injection Inject into the muscle.   [DISCONTINUED] flecainide  (TAMBOCOR ) 100 MG tablet TAKE 1 TABLET 2 TIMES DAILY AS NEEDED FOR ATRIAL FIBRILLATION -GENERIC FOR TAMBOCOR 

## 2024-11-14 NOTE — Patient Instructions (Signed)
 Medication Instructions:  Your physician recommends the following medication changes.   START TAKING: Zettia 10mg  Daily  Stop   Flecainide     *If you need a refill on your cardiac medications before your next appointment, please call your pharmacy*  Lab Work: No labs ordered today  If you have labs (blood work) drawn today and your tests are completely normal, you will receive your results only by: MyChart Message (if you have MyChart) OR A paper copy in the mail If you have any lab test that is abnormal or we need to change your treatment, we will call you to review the results.  Testing/Procedures:  CT Angiography (CTA) chest/aorta, is a special type of CT scan that uses a computer to produce multi-dimensional views of major blood vessels throughout the body. In CT angiography, a contrast material is injected through an IV to help visualize the blood vessels  Nothing to eat or drink 4 hours prior to test  [facility] [address]  [place], [place] [address]   Follow-Up: At [facility], you and your health needs are our priority.  As part of our continuing mission to provide you with exceptional heart care, our providers are all part of one team.  This team includes your primary Cardiologist (physician) and Advanced Practice Providers or APPs (Physician Assistants and Nurse Practitioners) who all work together to provide you with the care you need, when you need it.  Your next appointment:   1 year(s)  Provider:   You may see  one of the following Advanced Practice Providers on your designated Care Team:   Lonni Meager, NP Lesley Maffucci, PA-C Bernardino Bring, PA-C Cadence [place], PA-C Tylene Lunch, NP Barnie Hila, NP    We recommend signing up for the patient portal called MyChart.  Sign up information is provided on this After Visit Summary.  MyChart is used to connect with patients for Virtual Visits  (Telemedicine).  Patients are able to view lab/test results, encounter notes, upcoming appointments, etc.  Non-urgent messages can be sent to your provider as well.   To learn more about what you can do with MyChart, go to [email or web address].   Other Instructions Your physician has recommended that you wear a Zio monitor.   This monitor is a medical device that records the hearts electrical activity. Doctors most often use these monitors to diagnose arrhythmias. Arrhythmias are problems with the speed or rhythm of the heartbeat. The monitor is a small device applied to your chest. You can wear one while you do your normal daily activities. While wearing this monitor if you have any symptoms to push the button and record what you felt. Once you have worn this monitor for the period of time provider prescribed (Usually 14 days), you will return the monitor device in the postage paid box. Once it is returned they will download the data collected and provide us  with a report which the provider will then review and we will call you with those results. Important tips:  Avoid showering during the first 24 hours of wearing the monitor. Avoid excessive sweating to help maximize wear time. Do not submerge the device, no hot tubs, and no swimming pools. Keep any lotions or oils away from the patch. After 24 hours you may shower with the patch on. Take brief showers with your back facing the shower head.  Do not remove patch once it has been placed because that will interrupt data and decrease  adhesive wear time. Push the button when you have any symptoms and write down what you were feeling. Once you have completed wearing your monitor, remove and place into box which has postage paid and place in your outgoing mailbox.  If for some reason you have misplaced your box then call our office and we can provide another box and/or mail it off for you.   A referral for EP Suzann Riddle, NP

## 2024-11-15 ENCOUNTER — Ambulatory Visit: Payer: Self-pay | Attending: Cardiology | Admitting: Cardiology

## 2024-11-15 ENCOUNTER — Telehealth: Payer: Self-pay

## 2024-11-15 VITALS — BP 122/78 | Ht 70.0 in | Wt 220.0 lb

## 2024-11-15 DIAGNOSIS — I4891 Unspecified atrial fibrillation: Secondary | ICD-10-CM

## 2024-11-15 DIAGNOSIS — D6869 Other thrombophilia: Secondary | ICD-10-CM

## 2024-11-15 DIAGNOSIS — I480 Paroxysmal atrial fibrillation: Secondary | ICD-10-CM

## 2024-11-15 NOTE — Telephone Encounter (Signed)
 Error, checking on scheduling.

## 2024-11-15 NOTE — Progress Notes (Signed)
 Electrophysiology Clinic Note    Date:  11/15/2024  Patient ID:  Jonathon Middleton 07-30-1951, MRN [id number] PCP:  Rilla Baller, MD  Cardiologist:  Evalene Lunger, MD  Cardiology APP:  Lorene Lesley CROME, PA-C  Electrophysiology APP:  Mia Winthrop, NP     Discussed the use of AI scribe software for clinical note transcription with the patient, who gave verbal consent to proceed.   Patient Profile    Chief Complaint: AFib  History of Present Illness: Jonathon Middleton is a 73 y.o. male with PMH notable for persis AFib, HTN, HLD, non-obs CAD, T2DM; seen today for EP evaluation of AFib, referred by PA Lorene  Afib initially diagnosed 2014 on routine PCP visit. He was originally placed on amiodarone  by Dr. Gollan, which was subsequently stopped d/t hypothyroid. He was then started on PRN flecainide , slowly increased to daily flecainide . He saw PA Lorene yesterday where it was noted that he was in afib and highly concerning for persistent AFib. He is generally asymptomatic of his arrhythmia, maybe has noticed some slight fatigue. He checks BP regularly and cuff alerts for abnormal heart rhythm. He denies chest pain, chest pressure, SOB, increased edema. He is quite active working out several times a week.  During yesterday's visit with [facility], planning for 2 week zio to eval AF burden.   He denies s/s of OSA, no daytime fatigue, morning HA, is not aware of his snoring.     Arrhythmia/Device History Amiodarone  - stopped 2023 d/t thyroid  dysfunction Flecainide  - stopped 11/2024, ineffective    ROS:  Please see the history of present illness. All other systems are reviewed and otherwise negative.    Physical Exam    VS:  BP 122/78 (BP Location: Left Arm, Patient Position: Sitting, Cuff Size: Normal)   Ht 5' 10 (1.778 m)   Wt 220 lb (99.8 kg)   SpO2 98%   BMI 31.57 kg/m  BMI: Body mass index is 31.57 kg/m.           Wt Readings from Last 3 Encounters:  11/15/24  220 lb (99.8 kg)  11/14/24 220 lb 6.4 oz (100 kg)  06/28/24 213 lb (96.6 kg)     GEN- The patient is well appearing, alert and oriented x 3 today.   Lungs- Clear to ausculation bilaterally, normal work of breathing.  Heart- Irregularly irregular rate and rhythm, no murmurs, rubs or gallops Extremities- No peripheral edema, warm, dry    Studies Reviewed   Previous EP, cardiology notes.    EKG is not ordered. Personal review of EKG from 11/14/2024 shows:  Afib, RBBB at 67        10/2023 EKG - AFib at 100 08/15/2022 EKG - SB w 1st deg HB at 58bpm, PR 252  Cardiac CT, 10/26/2023 1. Coronary calcium  score of 1219. This was 87th percentile for age and sex matched control.  2. CAC >300 in LAD, RCA. CAC-DRS A3/N2.  3. Recommend aspirin and statin if no contraindication.  4. Recommend cardiology consultation.  5. Continue heart healthy lifestyle and risk factor modification.  Chest CT over-read, 10/26/2023 1.  Aortic Atherosclerosis (ICD10-I70.0). 2. Ectasia of ascending thoracic aorta (4.4 cm in diameter). Recommend annual imaging followup by CTA or MRA. This recommendation follows 2010 ACCF/AHA/AATS/ACR/ASA/SCA/SCAI/SIR/STS/SVM Guidelines for the Diagnosis and Management of Patients with Thoracic Aortic Disease. Circulation. 2010; 121: [id number]. Aortic aneurysm NOS (ICD10-I71.9).  TTE, 12/20/2022 (via care everywhere)    Assessment and Plan     #) persis  AFib, longstanding? Stopped flecianide yesterday Agree with 2 week zio to eval AF burden, will follow results Will update TTE Continue 120mg  dilt BID Patient appears appropriate AF ablation candidate, will discuss further at follow-up   #) Hypercoag d/t persis afib CHA2DS2-VASc Score = at least 4 [CHF History: 0, HTN History: 1, Diabetes History: 1, Stroke History: 0, Vascular Disease History: 1, Age Score: 1, Gender Score: 0].  Therefore, the patient's annual risk of stroke is 4.8 %.    Stroke ppx - 5mg  eliquis  BID,  appropriately dosed No bleeding concerns       Current medicines are reviewed at length with the patient today.   The patient does not have concerns regarding his medicines.  The following changes were made today:  none  Labs/ tests ordered today include:  Orders Placed This Encounter  Procedures   ECHOCARDIOGRAM COMPLETE     Disposition: Follow up with Dr. Kennyth or EP APP in 4 weeks after zio monitor and echo   Signed, Tambi Thole, NP  11/15/2024  1:05 PM  Electrophysiology [facility]

## 2024-11-15 NOTE — Telephone Encounter (Signed)
 Patient wishes to remain for scheduled appointment.

## 2024-11-15 NOTE — Patient Instructions (Signed)
 Medication Instructions:  Your physician recommends that you continue on your current medications as directed. Please refer to the Current Medication list given to you today.  *If you need a refill on your cardiac medications before your next appointment, please call your pharmacy*  Lab Work: No labs ordered today    Testing/Procedures: Your physician has requested that you have an echocardiogram. Echocardiography is a painless test that uses sound waves to create images of your heart. It provides your doctor with information about the size and shape of your heart and how well your hearts chambers and valves are working.   You may receive an ultrasound enhancing agent through an IV if needed to better visualize your heart during the echo. This procedure takes approximately one hour.  There are no restrictions for this procedure.  This will take place at [address] [facility] [address], [place] [address]  Please note: We ask at that you not bring children with you during ultrasound (echo/ vascular) testing. Due to room size and safety concerns, children are not allowed in the ultrasound rooms during exams. Our front office staff cannot provide observation of children in our lobby area while testing is being conducted. An adult accompanying a patient to their appointment will only be allowed in the ultrasound room at the discretion of the ultrasound technician under special circumstances. We apologize for any inconvenience.   Follow-Up: At [facility], you and your health needs are our priority.  As part of our continuing mission to provide you with exceptional heart care, our providers are all part of one team.  This team includes your primary Cardiologist (physician) and Advanced Practice Providers or APPs (Physician Assistants and Nurse Practitioners) who all work together to provide you with the care you need, when you need it.  Your next appointment:   4 to 6  week(s)  Provider:   Suzann Riddle, NP    Echo 2-3 weeks from now.

## 2024-11-15 NOTE — Telephone Encounter (Signed)
 I left a voicemail for Jonathon Middleton in regards to possibly rescheduling his New Consult with Suzann Riddle, NP. Patient saw provider Lesley Maffucci, PA on Thursday 11/14/2024 with the plan for a 1 month follow-up. If the patient has specific AF questions, he is welcome to keep his appointment for today. If Mr. Kentner would rather delay his appointment, that is also an acceptable plan. Patient asked to My Chart or return the call.

## 2024-11-21 ENCOUNTER — Ambulatory Visit (INDEPENDENT_AMBULATORY_CARE_PROVIDER_SITE_OTHER): Payer: Self-pay | Admitting: Psychology

## 2024-11-21 DIAGNOSIS — F4323 Adjustment disorder with mixed anxiety and depressed mood: Secondary | ICD-10-CM

## 2024-11-21 NOTE — Telephone Encounter (Signed)
 Patient scheduled on 12/30/24

## 2024-11-21 NOTE — Progress Notes (Signed)
 [facility] Counselor/Therapist Progress Note  Patient ID: Jonathon Middleton, MRN: [id number],    Date: 11/21/2024  Time Spent: 12:00pm-12:55pm   55 minutes   Treatment Type: Individual Therapy  Reported Symptoms: stress  Mental Status Exam: Appearance:  Casual     Behavior: Appropriate  Motor: Normal  Speech/Language:  Normal Rate  Affect: Appropriate  Mood: normal  Thought process: normal  Thought content:   WNL  Sensory/Perceptual disturbances:   WNL  Orientation: oriented to person, place, time/date, and situation  Attention: Good  Concentration: Good  Memory: WNL  Fund of knowledge:  Good  Insight:   Good  Judgment:  Good  Impulse Control: Good   Risk Assessment: Danger to Self:  No Self-injurious Behavior: No Danger to Others: No Duty to Warn:no Physical Aggression / Violence:No  Access to Firearms a concern: No  Gang Involvement:No   Subjective: Pt present for face-to-face individual therapy via video.  Pt consents to telehealth video session and is aware of limitations and benefits of virtual sessions.  Location of pt: home Location of therapist: home office.   Pt talked about the holidays.  He has plans with family on December 23rd.  He is grateful to be included in his ex wife's gathering with their kids.  Pt will be alone on Christmas day.  Addressed how this impacts him.  His oldest son is planning on getting pt a large tv and will mount it in his living room for him.  Pt is working on redecorating his house so it will be more cozy and reflect his taste.  Pt states this is the first time pt is making his house his own and not just what his exwife wanted.   Pt talked about his youngest son.  He does not hear from his son as much as he would like to even when pt texts him.  This is frustrating for pt.  Pt also gets his feelings hurt that he is told last minute about events for his grandkids.  Pt feels like an afterthought.  Helped pt process his  feelings.   Pt has been thinking about what his perfectionism and codependency is masking and what those behaviors are trying to protect himself from.  Helped pt process the issues and his feelings.  Pt states he is getting prepared for 2026.  Pt is feeling some enthusiasm about the possibilities for the new year.  Provided supportive therapy.   Interventions: Cognitive Behavioral Therapy and Insight-Oriented  Diagnosis:  F43.23  Plan of Care: Recommend ongoing therapy.  Pt participated in setting treatment goals.  Plan to continue to meet every two weeks. Pt agrees with treatment plan.   Treatment Plan Client Abilities/Strengths  Pt is bright, engaging, and motivated for therapy.  Client Treatment Preferences  Individual therapy.  Client Statement of Needs  Improve copings skills.  Improve self esteem.  Symptoms  Depressed or irritable mood. Excessive and/or unrealistic worry that is difficult to control occurring more days than not for at least 6 months about a number of events or activities. Hypervigilance (e.g., feeling constantly on edge, experiencing concentration difficulties, having trouble falling or staying asleep, exhibiting a general state of irritability). Low self-esteem. Problems Addressed  Unipolar Depression, Anxiety Goals 1. Alleviate depressive symptoms and return to previous level of effective functioning. 2. Appropriately grieve the loss in order to normalize mood and to return to previously adaptive level of functioning. Objective Learn and implement behavioral strategies to overcome depression. Target Date: 2025-05-09 Frequency:  Biweekly  Progress: 65 Modality: individual  Related Interventions Assist the client in developing skills that increase the likelihood of deriving pleasure from behavioral activation (e.g., assertiveness skills, developing an exercise plan, less internal/more external focus, increased social involvement); reinforce success. Engage the  client in behavioral activation, increasing his/her activity level and contact with sources of reward, while identifying processes that inhibit activation. use behavioral techniques such as instruction, rehearsal, role-playing, role reversal, as needed, to facilitate activity in the client's daily life; reinforce success. 3. Develop healthy interpersonal relationships that lead to the alleviation and help prevent the relapse of depression. 4. Develop healthy thinking patterns and beliefs about self, others, and the world that lead to the alleviation and help prevent the relapse of depression. 5. Enhance ability to effectively cope with the full variety of life's worries and anxieties. 6. Learn and implement coping skills that result in a reduction of anxiety and worry, and improved daily functioning. Objective Learn and implement problem-solving strategies for realistically addressing worries. Target Date: 2025-05-09 Frequency: Biweekly  Progress: 65 Modality: individual  Related Interventions Assign the client a homework exercise in which he/she problem-solves a current problem (see Mastery of Your Anxiety and Worry: Workbook by Richarda and Jonne or Generalized Anxiety Disorder by Delores Filler, and Jonne); review, reinforce success, and provide corrective feedback toward improvement. Teach the client problem-solving strategies involving specifically defining a problem, generating options for addressing it, evaluating the pros and cons of each option, selecting and implementing an optional action, and reevaluating and refining the action. Objective Learn and implement calming skills to reduce overall anxiety and manage anxiety symptoms. Target Date: 2025-05-09 Frequency: Biweekly  Progress: 65 Modality: individual  Related Interventions Assign the client to read about progressive muscle relaxation and other calming strategies in relevant books or treatment manuals (e.g., Progressive Relaxation  Training by Thornell armin Collier; Mastery of Your Anxiety and Worry: Workbook by Richarda armin Jonne). Assign the client homework each session in which he/she practices relaxation exercises daily, gradually applying them progressively from non-anxiety-provoking to anxiety-provoking situations; review and reinforce success while providing corrective feedback toward improvement. Teach the client calming/relaxation skills (e.g., applied relaxation, progressive muscle relaxation, cue controlled relaxation; mindful breathing; biofeedback) and how to discriminate better between relaxation and tension; teach the client how to apply these skills to his/her daily life. 7. Recognize, accept, and cope with feelings of depression. 8. Reduce overall frequency, intensity, and duration of the anxiety so that daily functioning is not impaired. 9. Resolve the core conflict that is the source of anxiety. 10. Stabilize anxiety level while increasing ability to function on a daily basis. Diagnosis F43.23  Conditions For Discharge Achievement of treatment goals and objectives   Veva Alma, LCSW

## 2024-12-14 DIAGNOSIS — I480 Paroxysmal atrial fibrillation: Secondary | ICD-10-CM

## 2024-12-15 ENCOUNTER — Ambulatory Visit: Payer: Self-pay | Admitting: Physician Assistant

## 2024-12-16 ENCOUNTER — Ambulatory Visit: Payer: Self-pay | Admitting: Cardiology

## 2024-12-16 ENCOUNTER — Ambulatory Visit: Payer: Self-pay | Attending: Cardiology

## 2024-12-16 DIAGNOSIS — I4891 Unspecified atrial fibrillation: Secondary | ICD-10-CM

## 2024-12-16 LAB — ECHOCARDIOGRAM COMPLETE
AR max vel: 1.83 cm2
AV Area VTI: 1.94 cm2
AV Area mean vel: 1.89 cm2
AV Mean grad: 3 mmHg
AV Peak grad: 5.3 mmHg
Ao pk vel: 1.15 m/s
Area-P 1/2: 4.6 cm2
S' Lateral: 3.6 cm

## 2024-12-19 ENCOUNTER — Ambulatory Visit: Payer: Self-pay | Admitting: Psychology

## 2024-12-19 DIAGNOSIS — F4323 Adjustment disorder with mixed anxiety and depressed mood: Secondary | ICD-10-CM

## 2024-12-19 NOTE — Progress Notes (Signed)
 "  [facility] Counselor/Therapist Progress Note  Patient ID: Jonathon Middleton, MRN: [id number],    Date: 12/19/2024  Time Spent: 12:00pm-12:55pm   55 minutes   Treatment Type: Individual Therapy  Reported Symptoms: stress  Mental Status Exam: Appearance:  Casual     Behavior: Appropriate  Motor: Normal  Speech/Language:  Normal Rate  Affect: Appropriate  Mood: normal  Thought process: normal  Thought content:   WNL  Sensory/Perceptual disturbances:   WNL  Orientation: oriented to person, place, time/date, and situation  Attention: Good  Concentration: Good  Memory: WNL  Fund of knowledge:  Good  Insight:   Good  Judgment:  Good  Impulse Control: Good   Risk Assessment: Danger to Self:  No Self-injurious Behavior: No Danger to Others: No Duty to Warn:no Physical Aggression / Violence:No  Access to Firearms a concern: No  Gang Involvement:No   Subjective: Pt present for face-to-face individual therapy via video.  Pt consents to telehealth video session and is aware of limitations and benefits of virtual sessions.  Location of pt: home Location of therapist: home office.   Pt talked about his health.  He has had afib and is getting medical care.  He has to wear a heart monitor.  He also had an echo cardiogram.  He goes back to the doctor on the 26th and may have to have a heart oblation.  Addressed pt's health concerns.   Pt talked about his son and daughter in law.   His daughter in law is loving pregnancy.  Pt has been able to spend some nice time with them.   Pt talked about work.   It is going ok and he is trying to embrace ambiguity. Pt has grappled with loneliness.  Pt is unsure about wanting to date.   He does not want his independence threatened.  Pt acknowledged that dating scares him.  He feels like he may not be equipped to handle a relationship.   Addressed that pt is capable but gets to choose whether he wants to date at this time or not.  Provided  supportive therapy.   Interventions: Cognitive Behavioral Therapy and Insight-Oriented  Diagnosis:  F43.23  Plan of Care: Recommend ongoing therapy.  Pt participated in setting treatment goals.  Plan to continue to meet every two weeks. Pt agrees with treatment plan.   Treatment Plan Client Abilities/Strengths  Pt is bright, engaging, and motivated for therapy.  Client Treatment Preferences  Individual therapy.  Client Statement of Needs  Improve copings skills.  Improve self esteem.  Symptoms  Depressed or irritable mood. Excessive and/or unrealistic worry that is difficult to control occurring more days than not for at least 6 months about a number of events or activities. Hypervigilance (e.g., feeling constantly on edge, experiencing concentration difficulties, having trouble falling or staying asleep, exhibiting a general state of irritability). Low self-esteem. Problems Addressed  Unipolar Depression, Anxiety Goals 1. Alleviate depressive symptoms and return to previous level of effective functioning. 2. Appropriately grieve the loss in order to normalize mood and to return to previously adaptive level of functioning. Objective Learn and implement behavioral strategies to overcome depression. Target Date: 2025-05-09 Frequency: Biweekly  Progress: 65 Modality: individual  Related Interventions Assist the client in developing skills that increase the likelihood of deriving pleasure from behavioral activation (e.g., assertiveness skills, developing an exercise plan, less internal/more external focus, increased social involvement); reinforce success. Engage the client in behavioral activation, increasing his/her activity level and contact with sources  of reward, while identifying processes that inhibit activation. use behavioral techniques such as instruction, rehearsal, role-playing, role reversal, as needed, to facilitate activity in the client's daily life; reinforce success. 3.  Develop healthy interpersonal relationships that lead to the alleviation and help prevent the relapse of depression. 4. Develop healthy thinking patterns and beliefs about self, others, and the world that lead to the alleviation and help prevent the relapse of depression. 5. Enhance ability to effectively cope with the full variety of life's worries and anxieties. 6. Learn and implement coping skills that result in a reduction of anxiety and worry, and improved daily functioning. Objective Learn and implement problem-solving strategies for realistically addressing worries. Target Date: 2025-05-09 Frequency: Biweekly  Progress: 65 Modality: individual  Related Interventions Assign the client a homework exercise in which he/she problem-solves a current problem (see Mastery of Your Anxiety and Worry: Workbook by Richarda and Jonne or Generalized Anxiety Disorder by Delores Filler, and Jonne); review, reinforce success, and provide corrective feedback toward improvement. Teach the client problem-solving strategies involving specifically defining a problem, generating options for addressing it, evaluating the pros and cons of each option, selecting and implementing an optional action, and reevaluating and refining the action. Objective Learn and implement calming skills to reduce overall anxiety and manage anxiety symptoms. Target Date: 2025-05-09 Frequency: Biweekly  Progress: 65 Modality: individual  Related Interventions Assign the client to read about progressive muscle relaxation and other calming strategies in relevant books or treatment manuals (e.g., Progressive Relaxation Training by Thornell armin Collier; Mastery of Your Anxiety and Worry: Workbook by Richarda armin Jonne). Assign the client homework each session in which he/she practices relaxation exercises daily, gradually applying them progressively from non-anxiety-provoking to anxiety-provoking situations; review and reinforce success while  providing corrective feedback toward improvement. Teach the client calming/relaxation skills (e.g., applied relaxation, progressive muscle relaxation, cue controlled relaxation; mindful breathing; biofeedback) and how to discriminate better between relaxation and tension; teach the client how to apply these skills to his/her daily life. 7. Recognize, accept, and cope with feelings of depression. 8. Reduce overall frequency, intensity, and duration of the anxiety so that daily functioning is not impaired. 9. Resolve the core conflict that is the source of anxiety. 10. Stabilize anxiety level while increasing ability to function on a daily basis. Diagnosis F43.23  Conditions For Discharge Achievement of treatment goals and objectives   Veva Alma, LCSW   "

## 2024-12-30 ENCOUNTER — Ambulatory Visit: Payer: Self-pay | Admitting: Cardiology

## 2024-12-31 ENCOUNTER — Encounter: Payer: Self-pay | Admitting: Cardiology

## 2024-12-31 ENCOUNTER — Ambulatory Visit: Payer: Self-pay | Attending: Cardiology | Admitting: Cardiology

## 2024-12-31 VITALS — BP 122/64 | HR 92 | Ht 69.0 in | Wt 223.0 lb

## 2024-12-31 DIAGNOSIS — I4729 Other ventricular tachycardia: Secondary | ICD-10-CM

## 2024-12-31 DIAGNOSIS — E1169 Type 2 diabetes mellitus with other specified complication: Secondary | ICD-10-CM

## 2024-12-31 DIAGNOSIS — D6869 Other thrombophilia: Secondary | ICD-10-CM

## 2024-12-31 DIAGNOSIS — I4891 Unspecified atrial fibrillation: Secondary | ICD-10-CM

## 2024-12-31 DIAGNOSIS — I2510 Atherosclerotic heart disease of native coronary artery without angina pectoris: Secondary | ICD-10-CM

## 2024-12-31 DIAGNOSIS — I480 Paroxysmal atrial fibrillation: Secondary | ICD-10-CM

## 2024-12-31 MED ORDER — AMIODARONE HCL 200 MG PO TABS: ORAL_TABLET | ORAL | 0 refills | Status: AC

## 2024-12-31 NOTE — Progress Notes (Signed)
 "     Electrophysiology Clinic Note    Date:  12/31/2024  Patient ID:  Jonathon Middleton, Jonathon Middleton Jun 20, 1951, MRN [id number] PCP:  Rilla Baller, MD  Cardiologist:  Evalene Lunger, MD  Cardiology APP:  Lorene Lesley CROME, PA-C  Electrophysiology APP:  Talia Hoheisel, NP     Discussed the use of AI scribe software for clinical note transcription with the patient, who gave verbal consent to proceed.   Patient Profile    Chief Complaint: AFib  History of Present Illness: Jonathon Middleton is a 74 y.o. male with PMH notable for persis AFib, HTN, HLD, non-obs CAD, T2DM; seen today for routine EP follow-up.   Afib initially diagnosed 2014 on routine PCP visit. He was originally placed on amiodarone  by Dr. Gollan, which was subsequently stopped d/t hypothyroid. He was then started on PRN flecainide , slowly increased to daily flecainide .  He continued to have persis AFib and so flecainide  was stopped I last saw him 11/2024 for initial EP evaluation, he had zio monitor on to eval Afib burden and planned TTE.   Today, he overall feels very well. He has no cardiac awareness of AFib. He continues to workout daily (though has not in past 3 days d/t snowy weather). He denies chest pain, chest pressure, palpitation. No SOB or reduced exercise tolerance.  He continues to take eliquis  BID without missing doses.      Arrhythmia/Device History Amiodarone  - stopped 2023 d/t thyroid  dysfunction Flecainide  - stopped 11/2024, ineffective    ROS:  Please see the history of present illness. All other systems are reviewed and otherwise negative.    Physical Exam    VS:  BP 122/64 (BP Location: Left Arm, Patient Position: Sitting, Cuff Size: Normal)   Pulse 92   Ht 5' 9 (1.753 m)   Wt 223 lb (101.2 kg)   SpO2 97%   BMI 32.93 kg/m  BMI: Body mass index is 32.93 kg/m.           Wt Readings from Last 3 Encounters:  12/31/24 223 lb (101.2 kg)  11/15/24 220 lb (99.8 kg)  11/14/24 220 lb 6.4 oz  (100 kg)     GEN- The patient is well appearing, alert and oriented x 3 today.   Lungs- Clear to ausculation bilaterally, normal work of breathing.  Heart- Irregularly irregular rate and rhythm, no murmurs, rubs or gallops Extremities- No peripheral edema, warm, dry    Studies Reviewed   Previous EP, cardiology notes.    EKG is ordered. Personal review of EKG from today shows:   EKG Interpretation Date/Time:  Tuesday December 31 2024 10:26:33 EST Ventricular Rate:  92 PR Interval:    QRS Duration:  138 QT Interval:  394 QTC Calculation: 487 R Axis:   78  Text Interpretation: Atrial fibrillation Right bundle branch block Confirmed by Clevon Khader [phone]) on 12/31/2024 12:44:09 PM     11/2024 EKG - AF, RBBB at 67 10/2023 EKG - AFib at 100 08/15/2022 EKG - SB w 1st deg HB at 58bpm, PR 252  TTE, 12/16/2024 1. Left ventricular ejection fraction, by estimation, is 50 to 55%. The left ventricle has normal function. The left ventricle has no regional wall motion abnormalities. Left ventricular diastolic parameters are indeterminate.   2. Right ventricular systolic function is normal. The right ventricular size is normal.   3. The mitral valve is normal in structure. Mild mitral valve regurgitation.   4. The aortic valve is tricuspid. Aortic valve regurgitation is mild.  5. The inferior vena cava is normal in size with greater than 50% respiratory variability, suggesting right atrial pressure of 3 mmHg.   Long term monitor, 12/09/2024 Patch Wear Time:  10 days and 20 hours (2025-12-17T17:37:54-0500 to 2025-12-28T14:15:12-498)   Atrial Fibrillation occurred continuously (100% burden), ranging from  49-175 bpm (avg of 82 bpm).   13 Ventricular Tachycardia runs occurred, the run with the fastest interval lasting 9.4 secs with a max rate of 250 bpm, the longest lasting 14.0 secs with an avg rate of 136 bpm.     Isolated VEs were occasional (2.1%, 73836), VE Couplets were rare (<1.0%,  3274), and VE Triplets were rare (<1.0%, 1325).  Ventricular Bigeminy and Trigeminy were present.    No significant patient triggered events recorded  Cardiac CT, 10/26/2023 1. Coronary calcium  score of 1219. This was 87th percentile for age and sex matched control.  2. CAC >300 in LAD, RCA. CAC-DRS A3/N2.  3. Recommend aspirin and statin if no contraindication.  4. Recommend cardiology consultation.  5. Continue heart healthy lifestyle and risk factor modification.  Chest CT over-read, 10/26/2023 1.  Aortic Atherosclerosis (ICD10-I70.0). 2. Ectasia of ascending thoracic aorta (4.4 cm in diameter). Recommend annual imaging followup by CTA or MRA. This recommendation follows 2010 ACCF/AHA/AATS/ACR/ASA/SCA/SCAI/SIR/STS/SVM Guidelines for the Diagnosis and Management of Patients with Thoracic Aortic Disease. Circulation. 2010; 121: Z733-z630. Aortic aneurysm NOS (ICD10-I71.9).  TTE, 12/20/2022 (via care everywhere)    Assessment and Plan     #) longstanding, persis AFib Updated monitor with 100% AF burden Long discussion with patient regarding AAD options, specifically amiodarone  vs tikosyn Patient prefers amiodarone  Will start 400mg  BID x 1 week, then 200mg  BID x 1 week, then 200mg  daily Update CMP, thyroid  labs today Will schedule DCCV in 3-4 weeks. I've asked him to notify office if he converts to sinus rhythm prior to procedure We briefly discuss that if he is able to hold sinus rhythm on amiodarone , it would be very reasonable to consider AF ablation. He appears to be appropriate ablation candidate Continue 120mg  dilt BID Reduce toprol  to 25mg  daily  #) Hypercoag d/t persis afib CHA2DS2-VASc Score = at least 4 [CHF History: 0, HTN History: 1, Diabetes History: 1, Stroke History: 0, Vascular Disease History: 1, Age Score: 1, Gender Score: 0].  Therefore, the patient's annual risk of stroke is 4.8 %.    Stroke ppx - 5mg  eliquis  BID, appropriately dosed No bleeding concerns  #)  NSVT #) coronary calcium  No ischemic complaints, exercises regularly without limitations or symptoms Obtain coronary CTA to further eval  #) T2DM Patient requests to update A1c with today's blood work Last A1c 06/2024 - 6.6 We briefly discussed the role of GLP-1/GIP injectables. He will discuss further with PCP     Current medicines are reviewed at length with the patient today.   The patient does not have concerns regarding his medicines.  The following changes were made today:   START amiodarone  as above REDUCE toprol  to 25mg  daily    Labs/ tests ordered today include:  Orders Placed This Encounter  Procedures   CT CORONARY MORPH W/CTA COR W/SCORE W/CA W/CM &/OR WO/CM   Comprehensive metabolic panel with GFR   T4, free   TSH   HgB A1c   CBC   EKG 12-Lead     Disposition: Follow up with Dr. Kennyth or EP APP as usual post procedure    Signed, Chantal Needle, NP  12/31/24  12:44 PM  Electrophysiology [facility] "

## 2024-12-31 NOTE — Patient Instructions (Signed)
 Medication Instructions:  Your physician recommends the following medication changes.   START TAKING: Amiodarone . 400 mg twice a day for one week. Then reduce to 200 mg twice a day for one week. Then reduce to 200 mg once a day.  DECREASE: Toprol  to 50 mg once a day.   *If you need a refill on your cardiac medications before your next appointment, please call your pharmacy*  Lab Work: Your provider would like for you to have following labs drawn today A1c, CBC, CMP, TSH and T4.     Testing/Procedures:     Dear Jonathon Middleton  You are scheduled for a Cardioversion on Friday, January 30 with Dr. Caron Poser  Please arrive at the Heart & Vascular Center Entrance of [facility], [address], [place] [address] at 11:00 AM (This is One hour(s) prior to your procedure time).  Proceed to the Check-In Desk directly inside the entrance.  Procedure Parking: Use the entrance off of the [address] side of the hospital. Turn right upon entering and follow the driveway to parking that is directly in front of the Heart & Vascular Center. There is no valet parking available at this entrance, however there is an awning directly in front of the Heart & Vascular Center for drop off/ pick up for patients.    DIET:  Nothing to eat or drink after midnight except a sip of water with medications (see medication instructions below)  MEDICATION INSTRUCTIONS: !!IF ANY NEW MEDICATIONS ARE STARTED AFTER TODAY, PLEASE NOTIFY YOUR PROVIDER AS SOON AS POSSIBLE!!     Continue taking your anticoagulant (blood thinner): Apixaban  (Eliquis ).  You will need to continue this after your procedure until you are told by your provider that it is safe to stop.      FYI:  For your safety, and to allow us  to monitor your vital signs accurately during the surgery/procedure we request: If you have artificial nails, gel coating, SNS etc, please have those removed prior to your surgery/procedure. Not having the nail  coverings /polish removed may result in cancellation or delay of your surgery/procedure.  Your support person will be asked to wait in the waiting room during your procedure.  It is OK to have someone drop you off and come back when you are ready to be discharged.  You cannot drive after the procedure and will need someone to drive you home.  Bring your insurance cards.  *Special Note: Every effort is made to have your procedure done on time. Occasionally there are emergencies that occur at the hospital that may cause delays. Please be patient if a delay does occur.      Your cardiac CT will be scheduled at one of the below locations:   [facility] [address] [place], [place] [address] [phone] (Severe contrast allergies only)  OR   [facility] [address]  [place], [place] [address]    If scheduled at [facility], please arrive at the [facility] Entrance (Entrance C2) of [facility] 30 minutes prior to test start time. You can use the FREE valet parking offered at entrance C (encouraged to control the heart rate for the test)  Proceed to the [facility] Radiology Department (first floor) to check-in and test prep.  All radiology patients and guests should use entrance C2 at [facility], accessed from [facility], even though the hospital's physical address listed is [address]  If scheduled at the Heart and Vascular Tower at [address], please enter the parking lot using the [address] entrance and use the FREE valet service at the patient drop-off area. Enter the building and check-in with registration on the main floor.  If scheduled at [facility], please arrive to the Heart and Vascular Center 15 mins early for check-in and test prep.  There is spacious parking and easy access to the radiology department from the [facility] Heart and  Vascular entrance. Please enter here and check-in with the desk attendant.   If scheduled at [facility], please arrive 30 minutes early for check-in and test prep.  Please follow these instructions carefully (unless otherwise directed):  An IV will be required for this test and Nitroglycerin will be given.  Hold all erectile dysfunction medications at least 3 days (72 hrs) prior to test. (Ie viagra, cialis, sildenafil, tadalafil, etc)   On the Night Before the Test: Be sure to Drink plenty of water. Do not consume any caffeinated/decaffeinated beverages or chocolate 12 hours prior to your test. Do not take any antihistamines 12 hours prior to your test.   On the Day of the Test: Drink plenty of water until 1 hour prior to the test. Do not eat any food 1 hour prior to test. You may take your regular medications prior to the test.  Take metoprolol  (Lopressor ) two hours prior to test. If you take Furosemide /Hydrochlorothiazide/Spironolactone/Chlorthalidone, please HOLD on the morning of the test. Patients who wear a continuous glucose monitor MUST remove the device prior to scanning. FEMALES- please wear underwire-free bra if available, avoid dresses & tight clothing  *For Clinical Staff only. Please instruct patient the following:* Heart Rate Medication Recommendations for Cardiac CT  Resting HR < 50 bpm  No medication  Resting HR 50-60 bpm and BP >110/50 mmHG   Consider Metoprolol  tartrate 25 mg PO 90-120 min prior to scan  Resting HR 60-65 bpm and BP >110/50 mmHG  Metoprolol  tartrate 50 mg PO 90-120 minutes prior to scan   Resting HR > 65 bpm and BP >110/50 mmHG  Metoprolol  tartrate 100 mg PO 90-120 minutes prior to scan  Consider Ivabradine 10-15 mg PO or a calcium  channel blocker for resting HR >60 bpm and contraindication to metoprolol  tartrate  Consider Ivabradine 10-15 mg PO in combination with metoprolol  tartrate for HR >80 bpm        After the Test: Drink plenty  of water. After receiving IV contrast, you may experience a mild flushed feeling. This is normal. On occasion, you may experience a mild rash up to 24 hours after the test. This is not dangerous. If this occurs, you can take Benadryl 25 mg, Zyrtec, Claritin, or Allegra and increase your fluid intake. (Patients taking Tikosyn should avoid Benadryl, and may take Zyrtec, Claritin, or Allegra) If you experience trouble breathing, this can be serious. If it is severe call 911 IMMEDIATELY. If it is mild, please call our office.  We will call to schedule your test 2-4 weeks out understanding that some insurance companies will need an authorization prior to the service being performed.   For more information and frequently asked questions, please visit our website : [email or web address]  For non-scheduling related questions, please contact the cardiac imaging nurse navigator should you have any questions/concerns: Cardiac Imaging Nurse Navigators Direct Office Dial: [phone]   For scheduling needs, including cancellations and rescheduling, please call Brittany, [phone].   Follow-Up:  Let us   know if your stomach gets upset with the Amiodarone .  Let us  know if you return back to Normal Sinus Rhythm.   At [facility], you and your health needs are our priority.  As part of our continuing mission to provide you with exceptional heart care, our providers are all part of one team.  This team includes your primary Cardiologist (physician) and Advanced Practice Providers or APPs (Physician Assistants and Nurse Practitioners) who all work together to provide you with the care you need, when you need it.  Your next appointment:   2 to 3 week(s) after Cardioversion [id number]  Provider:   Suzann Riddle, NP

## 2025-01-01 ENCOUNTER — Ambulatory Visit: Payer: Self-pay | Admitting: Cardiology

## 2025-01-01 LAB — COMPREHENSIVE METABOLIC PANEL WITH GFR
ALT: 35 [IU]/L (ref 0–44)
AST: 31 [IU]/L (ref 0–40)
Albumin: 4.4 g/dL (ref 3.8–4.8)
Alkaline Phosphatase: 105 [IU]/L (ref 47–123)
BUN/Creatinine Ratio: 34 — ABNORMAL HIGH (ref 10–24)
BUN: 32 mg/dL — ABNORMAL HIGH (ref 8–27)
Bilirubin Total: 0.7 mg/dL (ref 0.0–1.2)
CO2: 24 mmol/L (ref 20–29)
Calcium: 9.4 mg/dL (ref 8.6–10.2)
Chloride: 102 mmol/L (ref 96–106)
Creatinine, Ser: 0.94 mg/dL (ref 0.76–1.27)
Globulin, Total: 2.1 g/dL (ref 1.5–4.5)
Glucose: 100 mg/dL — ABNORMAL HIGH (ref 70–99)
Potassium: 4.5 mmol/L (ref 3.5–5.2)
Sodium: 143 mmol/L (ref 134–144)
Total Protein: 6.5 g/dL (ref 6.0–8.5)
eGFR: 86 mL/min/{1.73_m2}

## 2025-01-01 LAB — CBC
Hematocrit: 48.6 % (ref 37.5–51.0)
Hemoglobin: 15.8 g/dL (ref 13.0–17.7)
MCH: 30.2 pg (ref 26.6–33.0)
MCHC: 32.5 g/dL (ref 31.5–35.7)
MCV: 93 fL (ref 79–97)
Platelets: 180 10*3/uL (ref 150–450)
RBC: 5.23 x10E6/uL (ref 4.14–5.80)
RDW: 13.2 % (ref 11.6–15.4)
WBC: 7.4 10*3/uL (ref 3.4–10.8)

## 2025-01-01 LAB — HEMOGLOBIN A1C
Est. average glucose Bld gHb Est-mCnc: 134 mg/dL
Hgb A1c MFr Bld: 6.3 % — ABNORMAL HIGH (ref 4.8–5.6)

## 2025-01-01 LAB — T4, FREE: Free T4: 1.14 ng/dL (ref 0.82–1.77)

## 2025-01-01 LAB — TSH: TSH: 2.77 u[IU]/mL (ref 0.450–4.500)

## 2025-01-02 ENCOUNTER — Ambulatory Visit (INDEPENDENT_AMBULATORY_CARE_PROVIDER_SITE_OTHER): Payer: Self-pay | Admitting: Psychology

## 2025-01-02 ENCOUNTER — Telehealth: Payer: Self-pay

## 2025-01-02 DIAGNOSIS — F4323 Adjustment disorder with mixed anxiety and depressed mood: Secondary | ICD-10-CM

## 2025-01-02 NOTE — Progress Notes (Signed)
 "  [facility] Counselor/Therapist Progress Note  Patient ID: Kaedan Richert, MRN: [id number],    Date: 01/02/2025  Time Spent: 12:00pm-12:55pm   55 minutes   Treatment Type: Individual Therapy  Reported Symptoms: stress  Mental Status Exam: Appearance:  Casual     Behavior: Appropriate  Motor: Normal  Speech/Language:  Normal Rate  Affect: Appropriate  Mood: normal  Thought process: normal  Thought content:   WNL  Sensory/Perceptual disturbances:   WNL  Orientation: oriented to person, place, time/date, and situation  Attention: Good  Concentration: Good  Memory: WNL  Fund of knowledge:  Good  Insight:   Good  Judgment:  Good  Impulse Control: Good   Risk Assessment: Danger to Self:  No Self-injurious Behavior: No Danger to Others: No Duty to Warn:no Physical Aggression / Violence:No  Access to Firearms a concern: No  Gang Involvement:No   Subjective: Pt present for face-to-face individual therapy via video.  Pt consents to telehealth video session and is aware of limitations and benefits of virtual sessions.  Location of pt: home Location of therapist: home office.   Pt talked about his health.  He has had several doctors appointments to address his afib.  He had a lot of tests done.  Pt is having medication adjustments and a possible cardiac ablation.   Addressed pt's concerns about his health.  Pt is worried about his heart issues.   Pt talked about his friend Todd.  Pt got a strange text from him.  Addressed the text and how pt feels about it.  He feels very uncomfortable with what Todd was proposing.  Addressed how pt can respond to [facility] and effectively express his feelings.   Helped pt process his thoughts and feelings.  Provided supportive therapy.   Interventions: Cognitive Behavioral Therapy and Insight-Oriented  Diagnosis:  F43.23  Plan of Care: Recommend ongoing therapy.  Pt participated in setting treatment goals.  Plan to continue to meet  every two weeks. Pt agrees with treatment plan.   Treatment Plan Client Abilities/Strengths  Pt is bright, engaging, and motivated for therapy.  Client Treatment Preferences  Individual therapy.  Client Statement of Needs  Improve copings skills.  Improve self esteem.  Symptoms  Depressed or irritable mood. Excessive and/or unrealistic worry that is difficult to control occurring more days than not for at least 6 months about a number of events or activities. Hypervigilance (e.g., feeling constantly on edge, experiencing concentration difficulties, having trouble falling or staying asleep, exhibiting a general state of irritability). Low self-esteem. Problems Addressed  Unipolar Depression, Anxiety Goals 1. Alleviate depressive symptoms and return to previous level of effective functioning. 2. Appropriately grieve the loss in order to normalize mood and to return to previously adaptive level of functioning. Objective Learn and implement behavioral strategies to overcome depression. Target Date: 2025-05-09 Frequency: Biweekly  Progress: 65 Modality: individual  Related Interventions Assist the client in developing skills that increase the likelihood of deriving pleasure from behavioral activation (e.g., assertiveness skills, developing an exercise plan, less internal/more external focus, increased social involvement); reinforce success. Engage the client in behavioral activation, increasing his/her activity level and contact with sources of reward, while identifying processes that inhibit activation. use behavioral techniques such as instruction, rehearsal, role-playing, role reversal, as needed, to facilitate activity in the client's daily life; reinforce success. 3. Develop healthy interpersonal relationships that lead to the alleviation and help prevent the relapse of depression. 4. Develop healthy thinking patterns and beliefs about self, others, and  the world that lead to the  alleviation and help prevent the relapse of depression. 5. Enhance ability to effectively cope with the full variety of life's worries and anxieties. 6. Learn and implement coping skills that result in a reduction of anxiety and worry, and improved daily functioning. Objective Learn and implement problem-solving strategies for realistically addressing worries. Target Date: 2025-05-09 Frequency: Biweekly  Progress: 65 Modality: individual  Related Interventions Assign the client a homework exercise in which he/she problem-solves a current problem (see Mastery of Your Anxiety and Worry: Workbook by Richarda and Jonne or Generalized Anxiety Disorder by Delores Filler, and Jonne); review, reinforce success, and provide corrective feedback toward improvement. Teach the client problem-solving strategies involving specifically defining a problem, generating options for addressing it, evaluating the pros and cons of each option, selecting and implementing an optional action, and reevaluating and refining the action. Objective Learn and implement calming skills to reduce overall anxiety and manage anxiety symptoms. Target Date: 2025-05-09 Frequency: Biweekly  Progress: 65 Modality: individual  Related Interventions Assign the client to read about progressive muscle relaxation and other calming strategies in relevant books or treatment manuals (e.g., Progressive Relaxation Training by Thornell armin Collier; Mastery of Your Anxiety and Worry: Workbook by Richarda armin Jonne). Assign the client homework each session in which he/she practices relaxation exercises daily, gradually applying them progressively from non-anxiety-provoking to anxiety-provoking situations; review and reinforce success while providing corrective feedback toward improvement. Teach the client calming/relaxation skills (e.g., applied relaxation, progressive muscle relaxation, cue controlled relaxation; mindful breathing; biofeedback) and  how to discriminate better between relaxation and tension; teach the client how to apply these skills to his/her daily life. 7. Recognize, accept, and cope with feelings of depression. 8. Reduce overall frequency, intensity, and duration of the anxiety so that daily functioning is not impaired. 9. Resolve the core conflict that is the source of anxiety. 10. Stabilize anxiety level while increasing ability to function on a daily basis. Diagnosis F43.23  Conditions For Discharge Achievement of treatment goals and objectives   Veva Alma, LCSW   "

## 2025-01-02 NOTE — Telephone Encounter (Signed)
 I spoke with Pharmacist Olivia of [facility] to clarify patient's amiodarone  prescription. Pharmacy explained that they can only fill 365 days worth of medication and with Mr. Linck loading dose, they will only be able to fill 393 pills.

## 2025-01-16 ENCOUNTER — Ambulatory Visit: Payer: Self-pay | Admitting: Psychology

## 2025-01-23 ENCOUNTER — Ambulatory Visit: Payer: Self-pay | Admitting: Cardiology

## 2025-01-30 ENCOUNTER — Ambulatory Visit: Payer: Self-pay | Admitting: Psychology

## 2025-01-31 ENCOUNTER — Ambulatory Visit: Admission: RE | Admit: 2025-01-31 | Payer: Self-pay | Source: Home / Self Care

## 2025-01-31 ENCOUNTER — Encounter: Admission: RE | Payer: Self-pay | Source: Home / Self Care

## 2025-01-31 DIAGNOSIS — I4811 Longstanding persistent atrial fibrillation: Secondary | ICD-10-CM

## 2025-02-13 ENCOUNTER — Ambulatory Visit: Payer: Self-pay | Admitting: Psychology

## 2025-02-14 ENCOUNTER — Ambulatory Visit: Payer: Self-pay | Admitting: Cardiology

## 2025-02-27 ENCOUNTER — Ambulatory Visit: Payer: Self-pay | Admitting: Psychology
